# Patient Record
Sex: Female | Born: 1940 | Race: Black or African American | Hispanic: No | Smoking: Current every day smoker
Health system: Southern US, Community
[De-identification: ages and names within clinical notes are randomized; demographics above are authoritative.]

## PROBLEM LIST (undated history)

## (undated) DIAGNOSIS — Z720 Tobacco use: Secondary | ICD-10-CM

## (undated) DIAGNOSIS — I428 Other cardiomyopathies: Secondary | ICD-10-CM

## (undated) DIAGNOSIS — R42 Dizziness and giddiness: Secondary | ICD-10-CM

## (undated) DIAGNOSIS — I10 Essential (primary) hypertension: Secondary | ICD-10-CM

## (undated) DIAGNOSIS — I420 Dilated cardiomyopathy: Secondary | ICD-10-CM

## (undated) DIAGNOSIS — I447 Left bundle-branch block, unspecified: Secondary | ICD-10-CM

## (undated) DIAGNOSIS — M1990 Unspecified osteoarthritis, unspecified site: Secondary | ICD-10-CM

## (undated) DIAGNOSIS — I5022 Chronic systolic (congestive) heart failure: Secondary | ICD-10-CM

## (undated) DIAGNOSIS — I119 Hypertensive heart disease without heart failure: Secondary | ICD-10-CM

## (undated) HISTORY — PX: ABDOMINAL HYSTERECTOMY: SHX81

## (undated) HISTORY — PX: TONSILLECTOMY: SUR1361

## (undated) HISTORY — PX: TUBAL LIGATION: SHX77

## (undated) HISTORY — PX: APPENDECTOMY: SHX54

## (undated) HISTORY — PX: CARDIAC CATHETERIZATION: SHX172

---

## 1998-04-02 ENCOUNTER — Ambulatory Visit (HOSPITAL_COMMUNITY): Admission: RE | Admit: 1998-04-02 | Discharge: 1998-04-02 | Payer: Self-pay | Admitting: Family Medicine

## 2000-11-06 ENCOUNTER — Emergency Department (HOSPITAL_COMMUNITY): Admission: EM | Admit: 2000-11-06 | Discharge: 2000-11-06 | Payer: Self-pay | Admitting: *Deleted

## 2000-12-14 ENCOUNTER — Encounter: Payer: Self-pay | Admitting: Family Medicine

## 2000-12-14 ENCOUNTER — Ambulatory Visit (HOSPITAL_COMMUNITY): Admission: RE | Admit: 2000-12-14 | Discharge: 2000-12-14 | Payer: Self-pay | Admitting: Family Medicine

## 2010-10-19 ENCOUNTER — Encounter: Payer: Self-pay | Admitting: Obstetrics and Gynecology

## 2014-10-24 ENCOUNTER — Encounter (HOSPITAL_COMMUNITY): Payer: Self-pay

## 2014-10-24 ENCOUNTER — Other Ambulatory Visit: Payer: Self-pay

## 2014-10-24 ENCOUNTER — Emergency Department (HOSPITAL_COMMUNITY)
Admission: EM | Admit: 2014-10-24 | Discharge: 2014-10-24 | Disposition: A | Discharge status: Nursing Home (Includes ICF) | Payer: Medicare Other | Source: Home / Self Care | Attending: Emergency Medicine | Admitting: Emergency Medicine

## 2014-10-24 ENCOUNTER — Emergency Department (HOSPITAL_COMMUNITY): Payer: Medicare Other

## 2014-10-24 ENCOUNTER — Observation Stay (HOSPITAL_COMMUNITY)
Admission: EM | Admit: 2014-10-24 | Discharge: 2014-10-26 | Disposition: A | Discharge status: Home / Self Care | Payer: Medicare Other | Attending: Interventional Cardiology | Admitting: Interventional Cardiology

## 2014-10-24 ENCOUNTER — Encounter (HOSPITAL_COMMUNITY): Payer: Self-pay | Admitting: *Deleted

## 2014-10-24 DIAGNOSIS — Z91013 Allergy to seafood: Secondary | ICD-10-CM | POA: Insufficient documentation

## 2014-10-24 DIAGNOSIS — F17210 Nicotine dependence, cigarettes, uncomplicated: Secondary | ICD-10-CM | POA: Diagnosis not present

## 2014-10-24 DIAGNOSIS — I350 Nonrheumatic aortic (valve) stenosis: Secondary | ICD-10-CM | POA: Insufficient documentation

## 2014-10-24 DIAGNOSIS — R9439 Abnormal result of other cardiovascular function study: Secondary | ICD-10-CM | POA: Diagnosis present

## 2014-10-24 DIAGNOSIS — I447 Left bundle-branch block, unspecified: Secondary | ICD-10-CM

## 2014-10-24 DIAGNOSIS — I10 Essential (primary) hypertension: Secondary | ICD-10-CM | POA: Diagnosis not present

## 2014-10-24 DIAGNOSIS — I420 Dilated cardiomyopathy: Secondary | ICD-10-CM | POA: Diagnosis not present

## 2014-10-24 DIAGNOSIS — I5043 Acute on chronic combined systolic (congestive) and diastolic (congestive) heart failure: Secondary | ICD-10-CM

## 2014-10-24 DIAGNOSIS — Z720 Tobacco use: Secondary | ICD-10-CM | POA: Diagnosis present

## 2014-10-24 DIAGNOSIS — R42 Dizziness and giddiness: Secondary | ICD-10-CM | POA: Diagnosis not present

## 2014-10-24 HISTORY — DX: Dizziness and giddiness: R42

## 2014-10-24 HISTORY — DX: Left bundle-branch block, unspecified: I44.7

## 2014-10-24 HISTORY — DX: Dilated cardiomyopathy: I42.0

## 2014-10-24 HISTORY — DX: Tobacco use: Z72.0

## 2014-10-24 LAB — CBC
HEMATOCRIT: 46.2 % — AB (ref 36.0–46.0)
HEMOGLOBIN: 15.6 g/dL — AB (ref 12.0–15.0)
MCH: 29.9 pg (ref 26.0–34.0)
MCHC: 33.8 g/dL (ref 30.0–36.0)
MCV: 88.5 fL (ref 78.0–100.0)
Platelets: 225 10*3/uL (ref 150–400)
RBC: 5.22 MIL/uL — ABNORMAL HIGH (ref 3.87–5.11)
RDW: 13.2 % (ref 11.5–15.5)
WBC: 6.6 10*3/uL (ref 4.0–10.5)

## 2014-10-24 LAB — BASIC METABOLIC PANEL
ANION GAP: 6 (ref 5–15)
BUN: 14 mg/dL (ref 6–23)
CHLORIDE: 106 mmol/L (ref 96–112)
CO2: 26 mmol/L (ref 19–32)
Calcium: 10.7 mg/dL — ABNORMAL HIGH (ref 8.4–10.5)
Creatinine, Ser: 1.02 mg/dL (ref 0.50–1.10)
GFR calc Af Amer: 62 mL/min — ABNORMAL LOW (ref >90)
GFR, EST NON AFRICAN AMERICAN: 53 mL/min — AB (ref >90)
Glucose, Bld: 104 mg/dL — ABNORMAL HIGH (ref 70–99)
Potassium: 4.2 mmol/L (ref 3.5–5.1)
SODIUM: 138 mmol/L (ref 135–145)

## 2014-10-24 LAB — TROPONIN I

## 2014-10-24 LAB — TSH: TSH: 0.901 u[IU]/mL (ref 0.350–4.500)

## 2014-10-24 LAB — I-STAT TROPONIN, ED: Troponin i, poc: 0.01 ng/mL (ref 0.00–0.08)

## 2014-10-24 MED ORDER — SODIUM CHLORIDE 0.9 % IV SOLN
INTRAVENOUS | Status: DC
  Administered 2014-10-24: 15:00:00 via INTRAVENOUS

## 2014-10-24 MED ORDER — ASPIRIN EC 81 MG PO TBEC
81.0000 mg | DELAYED_RELEASE_TABLET | Freq: Every day | ORAL | Status: DC
  Administered 2014-10-24 – 2014-10-26 (×3): 81 mg via ORAL
  Filled 2014-10-24 (×3): qty 1

## 2014-10-24 MED ORDER — ASPIRIN 81 MG PO CHEW
324.0000 mg | CHEWABLE_TABLET | Freq: Once | ORAL | Status: AC
  Administered 2014-10-24: 324 mg via ORAL

## 2014-10-24 MED ORDER — ASPIRIN 81 MG PO CHEW
CHEWABLE_TABLET | ORAL | Status: AC
  Filled 2014-10-24: qty 4

## 2014-10-24 MED ORDER — METOPROLOL TARTRATE 50 MG PO TABS
50.0000 mg | ORAL_TABLET | Freq: Two times a day (BID) | ORAL | Status: DC
  Administered 2014-10-24 – 2014-10-25 (×2): 50 mg via ORAL
  Filled 2014-10-24 (×3): qty 1

## 2014-10-24 MED ORDER — ENOXAPARIN SODIUM 40 MG/0.4ML ~~LOC~~ SOLN
40.0000 mg | SUBCUTANEOUS | Status: DC
  Filled 2014-10-24 (×3): qty 0.4

## 2014-10-24 NOTE — ED Provider Notes (Signed)
CSN: [id number]     Arrival date & time 10/24/14  1613 History   First MD Initiated Contact with Patient 10/24/14 1620     Chief Complaint  Patient presents with  . Dizziness  . Abnormal ECG     (Consider location/radiation/quality/duration/timing/severity/associated sxs/prior Treatment) HPI 74 year old female past medical history of hypertension presents to ED for lightheadedness which is been ongoing for the past 4 days. Patient reports lightheadedness has been intermittent during this time. She reports having an episode of diaphoresis when symptoms again 4 days ago but none since that time. Patient also denies having any chest pain, shortness of breath, nausea, vomiting, indigestion. States she has not been sick recently. Denies having prior history of similar symptoms. Patient states she is seeing cardiology in the past for heart palpitations and after having medication changes she has not had any issues with that since. Patient reports she is a smoker. Prior to coming to the ED she was seen at urgent care where she was given a full dose aspirin.    Past Medical History  Diagnosis Date  . Hypertension    Past Surgical History  Procedure Laterality Date  . Abdominal hysterectomy     History reviewed. No pertinent family history. History  Substance Use Topics  . Smoking status: Current Every Day Smoker -- 0.50 packs/day  . Smokeless tobacco: Not on file  . Alcohol Use: No     Comment: on special occasions   OB History    No data available     Review of Systems  Constitutional: Positive for diaphoresis. Negative for fever and chills.  HENT: Negative for congestion, rhinorrhea and sore throat.   Eyes: Negative for visual disturbance.  Respiratory: Negative for cough and shortness of breath.   Cardiovascular: Negative for chest pain, palpitations and leg swelling.  Gastrointestinal: Negative for nausea, vomiting, abdominal pain, diarrhea and constipation.  Genitourinary:  Negative for dysuria, hematuria, vaginal bleeding and vaginal discharge.  Musculoskeletal: Negative for back pain and neck pain.  Skin: Negative for rash.  Neurological: Positive for light-headedness. Negative for weakness and headaches.  All other systems reviewed and are negative.     Allergies  Shellfish allergy  Home Medications   Prior to Admission medications   Medication Sig Start Date End Date Taking? Authorizing Provider  metoprolol (LOPRESSOR) 50 MG tablet Take 50 mg by mouth 2 (two) times daily.    Historical Provider, MD   BP 141/83 mmHg  Pulse 64  Temp(Src) 97.8 F (36.6 C) (Oral)  Resp 18  Ht 5\' 8"  (1.727 m)  Wt 215 lb (97.523 kg)  BMI 32.70 kg/m2  SpO2 99% Physical Exam  Constitutional: She is oriented to person, place, and time. She appears well-developed and well-nourished. No distress.  HENT:  Head: Normocephalic and atraumatic.  Eyes: Conjunctivae are normal.  Neck: Normal range of motion.  Cardiovascular: Normal rate, regular rhythm, normal heart sounds and intact distal pulses.   No murmur heard. Pulmonary/Chest: Effort normal and breath sounds normal. No respiratory distress. She has no wheezes. She has no rales. She exhibits no tenderness.  Abdominal: Soft. Bowel sounds are normal. She exhibits no distension.  Musculoskeletal: Normal range of motion.  Neurological: She is alert and oriented to person, place, and time. No cranial nerve deficit. GCS eye subscore is 4. GCS verbal subscore is 5. GCS motor subscore is 6.  HDS, AAOx4. PERRL, EOMI, TML, face sym. CN 2-12 grossly intact. 5/5 sym, no drift, SILT, normal gait and coordination.  Skin: Skin is warm and dry.  Psychiatric: She has a normal mood and affect.  Nursing note and vitals reviewed.   ED Course  Procedures (including critical care time) Labs Review Labs Reviewed  BASIC METABOLIC PANEL - Abnormal; Notable for the following:    Glucose, Bld 104 (*)    Calcium 10.7 (*)    GFR calc  non Af Amer 53 (*)    GFR calc Af Amer 62 (*)    All other components within normal limits  CBC - Abnormal; Notable for the following:    RBC 5.22 (*)    Hemoglobin 15.6 (*)    HCT 46.2 (*)    All other components within normal limits  TSH  TROPONIN I  BASIC METABOLIC PANEL  TROPONIN I  TROPONIN I  I-STAT TROPOININ, ED    Imaging Review Dg Chest Port 1 View  10/24/2014   CLINICAL DATA:  Dizziness for 5 days, abnormal EKG, history hypertension, smoking  EXAM: PORTABLE CHEST - 1 VIEW  COMPARISON:  Portable exam 1654 hr without priors for comparison  FINDINGS: Enlargement of cardiac silhouette.  Mediastinal contours and pulmonary vascularity normal.  Lungs clear.  No pleural effusion or pneumothorax.  Mild atherosclerotic calcification aorta.  Bones unremarkable.  IMPRESSION: Enlargement of cardiac silhouette.  No acute abnormalities.   Electronically Signed   By: Ulyses Southward M.D.   On: 10/24/2014 17:03     EKG Interpretation   Date/Time:  Wednesday October 24 2014 16:16:21 EST Ventricular Rate:  66 PR Interval:  240 QRS Duration: 186 QT Interval:  476 QTC Calculation: 499 R Axis:   -9 Text Interpretation:  Sinus rhythm Prolonged PR interval Left bundle  branch block Prior with minimal LVH criteria, nml PR Confirmed by DOCHERTY   MD, MEGAN ([id number]) on 10/24/2014 4:20:46 PM      MDM   Final diagnoses:  None    Patient presents from urgent care for lightheadedness and new left bundle-branch block. On arrival patient is hemodynamically stable and in no apparent distress. She states her dizziness has resolved. Patient is adamant that she has had absolutely no chest pain, shortness of breath during past 4 days. EKG in ED shows new left bundle branch block which was not on prior EKG. Patient has approximately 6 mm of ST elevation in V1, without ST elevation in other leads. Given patient has no chest pain and will await labs and discuss with cardiology. Cards will admit pt.  Pt seen  in conjunction with Dr. Donnamarie Rossetti, DO [facility] Emergency Medicine Resident - PGY-2      Ames Dura, MD 10/25/14 1610  Toy Cookey, MD 10/25/14 1208

## 2014-10-24 NOTE — ED Notes (Signed)
[facility]- pt coming from [facility], was seen there for dizziness X4 days, pt was noticed to have new left bundle branch block. Pt denies chest pain. A&O x4 on arrival.

## 2014-10-24 NOTE — ED Provider Notes (Signed)
CSN: [id number]     Arrival date & time 10/24/14  1411 History   None    Chief Complaint  Patient presents with  . Dizziness   (Consider location/radiation/quality/duration/timing/severity/associated sxs/prior Treatment) HPI         74 year old female with history of high blood pressure, on metoprolol, presents complaining of dizziness. For about 4 days she has had intermittent episodes of dizziness. These are described as feeling lightheaded without any other associated symptoms. This will last for about 2 minutes and will resolve with rest. It happens about 5-6 times per day. She denies any alleviating or exacerbating factors. She denies chest pain or shortness of breath, no nausea or vomiting. No history of dizziness or heart disease.  Past Medical History  Diagnosis Date  . Hypertension    Past Surgical History  Procedure Laterality Date  . Abdominal hysterectomy     History reviewed. No pertinent family history. History  Substance Use Topics  . Smoking status: Current Every Day Smoker  . Smokeless tobacco: Not on file  . Alcohol Use: No   OB History    No data available     Review of Systems  Constitutional: Negative for fever and chills.  Gastrointestinal: Negative for nausea and vomiting.  Skin: Negative for rash.  Neurological: Positive for dizziness.  All other systems reviewed and are negative.   Allergies  Shellfish allergy  Home Medications   Prior to Admission medications   Medication Sig Start Date End Date Taking? Authorizing Provider  metoprolol (LOPRESSOR) 50 MG tablet Take 50 mg by mouth 2 (two) times daily.   Yes Historical Provider, MD   BP 152/96 mmHg  Pulse 80  Temp(Src) 98.6 F (37 C) (Oral)  Resp 18  SpO2 97% Physical Exam  Constitutional: She is oriented to person, place, and time. Vital signs are normal. She appears well-developed and well-nourished. No distress.  HENT:  Head: Normocephalic and atraumatic.  Neck: Normal range of  motion. Neck supple. No JVD present. No tracheal deviation present.  Cardiovascular: Normal rate, regular rhythm and normal heart sounds.   Pulmonary/Chest: Effort normal and breath sounds normal. No respiratory distress.  Lymphadenopathy:    She has no cervical adenopathy.  Neurological: She is alert and oriented to person, place, and time. She has normal strength. Coordination normal.  Skin: Skin is warm and dry. No rash noted. She is not diaphoretic.  Psychiatric: She has a normal mood and affect. Judgment normal.  Nursing note and vitals reviewed.   ED Course  ED EKG  Date/Time: 10/24/2014 3:20 PM Performed by: Autumn Messing, H Authorized by: Autumn Messing, H Comparison: not compared with previous ECG  Previous ECG: no previous ECG available Rhythm: sinus rhythm Rate: normal QRS axis: normal Conduction: left bundle branch block Other: no other findings Clinical impression: abnormal ECG   (including critical care time) Labs Review Labs Reviewed - No data to display  Imaging Review No results found.   MDM   1. Dizziness   2. LBBB (left bundle branch block)    New LBBB, r/o MI.  Started on IV, O2, monitor, 324 ASA, transferred to ED via EMS     Graylon Good, PA-C 10/24/14 1521

## 2014-10-24 NOTE — H&P (Addendum)
ADMISSION HISTORY AND PHYSICAL   Date: 10/24/2014               Patient Name:  Nicole Chan MRN: [id number]  DOB: 1940/11/25 Age / Sex: 74 y.o., female        PCP: Lesleigh Noe Primary Cardiologist: Katrinka Blazing          History of Present Illness: Patient is a 74 y.o. female with a PMHx of HTN , who was admitted to [facility] on 10/24/2014 for evaluation of  Dizziness .  She has had intermittant dizziness - 5-6 times a day ,  Denies any chest pain or dyspnea. ,  No N/V   Each episode of dizziness may last 15 minutes.  . This past Saturday she had profound dizziness .    Her daughter says that she was quite weak.  Denies any nausea, vomitting.  Eating well.  Some of the episodes are c/w  Vertigo - others are not associated with head movement.  These episodes are not related to movement , .  Not necessarily related to standing  Up - can occur while sitting down.  No dysuria. BP was noted to be elevated.  No nausea Woke up in a sweat several nights ago  She has never had these before.     Medications: Outpatient medications:  (Not in a hospital admission)  Allergies  Allergen Reactions  . Shellfish Allergy      Past Medical History  Diagnosis Date  . Hypertension     Past Surgical History  Procedure Laterality Date  . Abdominal hysterectomy      Family History  Problem Relation Age of Onset  . Arrhythmia Mother   . Hypertension Mother   . Diabetes Mother     Social History:  reports that she has been smoking.  She does not have any smokeless tobacco history on file. She reports that she does not drink alcohol. Her drug history is not on file.   Review of Systems: Constitutional:  denies fever, chills, diaphoresis, appetite change and fatigue.  HEENT: denies photophobia, eye pain, redness, hearing loss, ear pain, congestion, sore throat, rhinorrhea, sneezing, neck pain, neck stiffness and tinnitus.  Respiratory: denies SOB, DOE, cough, chest tightness,  and wheezing.  Cardiovascular: denies chest pain, palpitations and leg swelling.  Gastrointestinal: denies nausea, vomiting, abdominal pain, diarrhea, constipation, blood in stool.  Genitourinary: denies dysuria, urgency, frequency, hematuria, flank pain and difficulty urinating.  Musculoskeletal: denies  myalgias, back pain, joint swelling, arthralgias and gait problem.   Skin: denies pallor, rash and wound.  Neurological: admits to dizziness, , denies symcope   Hematological: denies adenopathy, easy bruising, personal or family bleeding history.  Psychiatric/ Behavioral: denies suicidal ideation, mood changes, confusion, nervousness, sleep disturbance and agitation.     Physical Exam: BP 141/83 mmHg  Pulse 64  Temp(Src) 97.8 F (36.6 C) (Oral)  Resp 18  Ht 5\' 8"  (1.727 m)  Wt 215 lb (97.523 kg)  BMI 32.70 kg/m2  SpO2 99%  Wt Readings from Last 3 Encounters:  10/24/14 215 lb (97.523 kg)    General: Vital signs reviewed and noted. Well-developed, well-nourished, in no acute distress; alert,   Head: Normocephalic, atraumatic, sclera anicteric, mucus membranes are moist   Neck: Supple. Negative for carotid bruits. JVD not elevated.   Lungs:  Clear bilaterally to auscultation without wheezes, rales, or rhonchi. Breathing is normal   Heart: RRR with S1 S2. No murmurs, rubs, or gallops.   Abdomen:  Soft, non-tender, non-distended with normoactive bowel sounds. No hepatomegaly. No rebound/guarding. No obvious abdominal masses   MSK: Strength and the appear normal for age.   Extremities: No clubbing or cyanosis. No edema.  Distal pedal pulses are 2+ and equal bilaterally .  Neurologic: Alert and oriented X 3. Moves all extremities spontaneously   Psych:  normal     Lab results: Basic Metabolic Panel: No results for input(s): NA, K, CL, CO2, GLUCOSE, BUN, CREATININE, CALCIUM, MG, PHOS in the last 168 hours.  Liver Function Tests: No results for input(s): AST, ALT, ALKPHOS,  BILITOT, PROT, ALBUMIN in the last 168 hours. No results for input(s): LIPASE, AMYLASE in the last 168 hours.  CBC: No results for input(s): WBC, NEUTROABS, HGB, HCT, MCV, PLT in the last 168 hours.  Cardiac Enzymes: No results for input(s): CKTOTAL, CKMB, CKMBINDEX, TROPONINI in the last 168 hours.  BNP: Invalid input(s): POCBNP  CBG: No results for input(s): GLUCAP in the last 168 hours.  Coagulation Studies: No results for input(s): LABPROT, INR in the last 72 hours.   Other results: EKG ( reviewed by me)  :  NSR .  Rate of 66. LBBB   Imaging: Dg Chest Port 1 View  10/24/2014   CLINICAL DATA:  Dizziness for 5 days, abnormal EKG, history hypertension, smoking  EXAM: PORTABLE CHEST - 1 VIEW  COMPARISON:  Portable exam 1654 hr without priors for comparison  FINDINGS: Enlargement of cardiac silhouette.  Mediastinal contours and pulmonary vascularity normal.  Lungs clear.  No pleural effusion or pneumothorax.  Mild atherosclerotic calcification aorta.  Bones unremarkable.  IMPRESSION: Enlargement of cardiac silhouette.  No acute abnormalities.   Electronically Signed   By: Ulyses Southward M.D.   On: 10/24/2014 17:03      Assessment & Plan:  1. Dizziness: The patient presents with several days of dizziness. She has intermittent dizziness that occurs several times through the day.  She had a particularly bad day this past Saturday. Her daughter states that she was very weak and was not able to do any sort of activity. Summary of her episodes sound consistent with vertigo and others sound consistent with orthostasis. She then describes episodes that occur while sitting down and are not related to twisting her head. These are concerning for an arrhythmia.  She does have a left bundle-branch block which is newly recognized. She does not recall being told that she had a left bundle-branch block in the past.  She's not having any severe cardiac symptoms but I do think that it is reasonable to  admit her for observation and rule out myocardial infarction given her new left bundle-branch block. We will admit her to telemetry.  Will anticipate doing an echo cardiac MR. If she has a normal echocardiogram and has no dysrhythmias overnight I think that she can be discharged home in continue the workup as an outpatient. I wouldn't his pain that she will need a Lexiscan Myoview study.  Will repeat BMP tomorrow.  Will check TSH.   2. Hypertension: The patient's blood pressure and heart rate are well controlled. Continue current dose of metoprolol for the time being.  3. History of cigarette smoking: I've encouraged her to stop smoking. Her hemoglobin is 15.6 which is a bit elevated. I suspect this is due to smoking.  DVT PPX -    Alvia Grove., MD, [facility] 10/24/2014, 5:31 PM

## 2014-10-24 NOTE — ED Notes (Signed)
PT    PLACED    ON  CARDIAC  MONITOR  NASAL  O2  AT  2 L  /  MIN       IV  NS  20  ANGIO   L  ARM  1  ATT  SITE  PATENT

## 2014-10-25 ENCOUNTER — Other Ambulatory Visit: Payer: Self-pay | Admitting: Physician Assistant

## 2014-10-25 ENCOUNTER — Encounter (HOSPITAL_COMMUNITY): Payer: Self-pay | Admitting: Physician Assistant

## 2014-10-25 DIAGNOSIS — I10 Essential (primary) hypertension: Secondary | ICD-10-CM | POA: Diagnosis present

## 2014-10-25 DIAGNOSIS — R42 Dizziness and giddiness: Secondary | ICD-10-CM

## 2014-10-25 DIAGNOSIS — I420 Dilated cardiomyopathy: Secondary | ICD-10-CM | POA: Diagnosis not present

## 2014-10-25 DIAGNOSIS — I447 Left bundle-branch block, unspecified: Secondary | ICD-10-CM | POA: Diagnosis not present

## 2014-10-25 HISTORY — DX: Dizziness and giddiness: R42

## 2014-10-25 LAB — TROPONIN I: Troponin I: <0.03 ng/mL (ref <0.031)

## 2014-10-25 MED ORDER — IRBESARTAN 75 MG PO TABS
75.0000 mg | ORAL_TABLET | Freq: Every day | ORAL | Status: DC
  Administered 2014-10-25 – 2014-10-26 (×2): 75 mg via ORAL
  Filled 2014-10-25 (×2): qty 1

## 2014-10-25 MED ORDER — CARVEDILOL 6.25 MG PO TABS
6.2500 mg | ORAL_TABLET | Freq: Two times a day (BID) | ORAL | Status: DC
  Administered 2014-10-25 – 2014-10-26 (×3): 6.25 mg via ORAL
  Filled 2014-10-25 (×4): qty 1

## 2014-10-25 NOTE — Progress Notes (Signed)
Nursing note RN in to speak with patient, explained the importance of the lab work needing to be drawn, pt stated she is "done with all blood draws at this time" she agreed to continue with the scheduled stress test for today but will not get her blood draw for Labs. Will continue to monitor patient . Nakira Litzau, Randall An RN

## 2014-10-25 NOTE — Progress Notes (Signed)
10/25/2014 8:18 AM Pt. Refusing all blood draws.  Dr. Katrinka Blazing notified.  No orders received. Kathryne Hitch

## 2014-10-25 NOTE — Progress Notes (Signed)
Echocardiogram completed.

## 2014-10-25 NOTE — Progress Notes (Addendum)
Patient Name: Nicole Chan Date of Encounter: 10/25/2014     Active Problems:   LBBB (left bundle branch block)    SUBJECTIVE  Feeling fine. No dizziness. We stood up an walked around together and she still felt fine. She would like to go home.   CURRENT MEDS . aspirin EC  81 mg Oral Daily  . enoxaparin (LOVENOX) injection  40 mg Subcutaneous Q24H  . metoprolol  50 mg Oral BID    OBJECTIVE  Filed Vitals:   10/24/14 1845 10/24/14 1925 10/24/14 2125 10/25/14 0505  BP: 126/72 160/87 156/69 146/58  Pulse: 59 69 63 55  Temp:  98.1 F (36.7 C)  97.8 F (36.6 C)  TempSrc:  Oral  Oral  Resp: 14 18  18   Height:      Weight:  236 lb 5.3 oz (107.2 kg)    SpO2: 100% 100%  98%   No intake or output data in the 24 hours ending 10/25/14 0910 Filed Weights   10/24/14 1618 10/24/14 1925  Weight: 215 lb (97.523 kg) 236 lb 5.3 oz (107.2 kg)    PHYSICAL EXAM  General: Pleasant, NAD. Neuro: Alert and oriented X 3. Moves all extremities spontaneously. Psych: Normal affect. HEENT:  Normal  Neck: Supple without bruits or JVD. Lungs:  Resp regular and unlabored, CTA. Heart: RRR no s3, s4, or murmurs. Abdomen: Soft, non-tender, non-distended, BS + x 4.  Extremities: No clubbing, cyanosis or edema. DP/PT/Radials 2+ and equal bilaterally.  Accessory Clinical Findings  CBC  Recent Labs  10/24/14 1722  WBC 6.6  HGB 15.6*  HCT 46.2*  MCV 88.5  PLT 225   Basic Metabolic Panel  Recent Labs  10/24/14 1722  NA 138  K 4.2  CL 106  CO2 26  GLUCOSE 104*  BUN 14  CREATININE 1.02  CALCIUM 10.7*    Cardiac Enzymes  Recent Labs  10/24/14 2046  TROPONINI <0.03    Thyroid Function Tests  Recent Labs  10/24/14 1838  TSH 0.901    TELE  LBBB, sinus brady HR 50s.  Radiology/Studies  Dg Chest Port 1 View  10/24/2014   CLINICAL DATA:  Dizziness for 5 days, abnormal EKG, history hypertension, smoking  EXAM: PORTABLE CHEST - 1 VIEW  COMPARISON:  Portable exam  1654 hr without priors for comparison  FINDINGS: Enlargement of cardiac silhouette.  Mediastinal contours and pulmonary vascularity normal.  Lungs clear.  No pleural effusion or pneumothorax.  Mild atherosclerotic calcification aorta.  Bones unremarkable.  IMPRESSION: Enlargement of cardiac silhouette.  No acute abnormalities.   Electronically Signed   By: Ulyses Southward M.D.   On: 10/24/2014 17:03    ASSESSMENT AND PLAN  Nicole Chan is a 74 y.o. female with a history of HTN and tobacco abuse who presented to [facility] ED on 10/24/14 with recurrent episodes of dizziness.   Dizziness: The patient presents with several days of dizziness. She has intermittent dizziness that occurs several times through the day. She had a particularly bad day this past Saturday. Her daughter states that she was very weak and was not able to do any sort of activity. Summary of her episodes sound consistent with vertigo and others sound consistent with orthostasis. She then describes episodes that occur while sitting down and are not related to twisting her head. -- Tele reveals no arrhythmias, aside from some sinus bradycardia: HR 50s. -- TSH normal  -- 2D ECHO and carotid dopplers pending today. If 2D ECHO is okay, will discharge  home with 2 week event monitor and f/u with Dr. Katrinka Blazing.   Newly recognized LBBB- Troponin negative and no chest pain.   -- 2D ECHO pending today. If 2D ECHO is okay, will discharge home   Hypertension: The patient's blood pressure and heart rate are well controlled. Continue current dose of metoprolol for the time being.  History of cigarette smoking: Her hemoglobin is 15.6 which is a bit elevated. I suspect this is due to smoking. -- She is confident that she can quit on her own, but knows we are willing to prescribe nicotine patches or other cessation aids if she needs this in the future.    Billy Fischer PA-C  Pager [phone]  I seen and evaluated the patient this morning  after being seen by Ms. Janee Morn.  I reviewed the clinical data in chart. Principal Problem:   Episode of dizziness Active Problems:   Congestive dilated cardiomyopathy: New diagnosis   Essential hypertension   LBBB (left bundle branch block)   Patient was admitted with atypical symptoms of dizziness and nausea. Concern was for possible arrhythmias. The initial thought would be if she ruled out for MI discharge plans to monitor. Unfortunately while I was seeing her, her echocardiogram was read indicating an EF of 30-35% with global hypokinesis and elevated filling pressures. This would be considered a new diagnosis of cardiomyopathy. With left bundle-branch block and we diagnosed cardiomyopathy, the concern is this is ischemic versus nonischemic. Potentially hypertensive versus bundle-branch related. However with new bundle-branch block and new EF being down we definitely need to exclude ischemia.  I spent close to 30 minutes discussing options for further evaluation with the patient including inpatient cardiac catheterization for definitive diagnosis, inpatient nuclear stress test discharge with outpatient nuclear stress test. She has chosen to proceed with inpatient nuclear stress test as she is not interested currently in the LAD of an invasive evaluation.  We will then continue with the plan to do an outpatient event monitor to ensure that she is not having any severe arrhythmias that are causing her dizziness. The concern is that these episodes could also potentially be anginal equivalents.  Plan: Lexiscan Myoview in the morning (she has disc disease and is concerned about the ability of increasing her heart rate enough with brisk walking without hurting her back).  She is currently on Lopressor 50 mg twice a day which would probably better converted to either Toprol or carvedilol. She is deathly has blood pressure room to make this adjustment. I will convert her to carvedilol and add  ARB.  Marykay Lex, M.D., M.S. Interventional Cardiologist   Pager # [phone]

## 2014-10-25 NOTE — Progress Notes (Signed)
UR completed 

## 2014-10-25 NOTE — Progress Notes (Signed)
VASCULAR LAB PRELIMINARY  PRELIMINARY  PRELIMINARY  PRELIMINARY  Carotid Dopplers completed.    Preliminary report:  1-39% ICA stenosis.  Vertebral artery flow is antegrade.   Karess Harner, RVT 10/25/2014, 10:25 AM

## 2014-10-26 ENCOUNTER — Encounter (HOSPITAL_COMMUNITY): Payer: Self-pay | Admitting: Cardiology

## 2014-10-26 ENCOUNTER — Observation Stay (HOSPITAL_COMMUNITY): Payer: Medicare Other

## 2014-10-26 DIAGNOSIS — I10 Essential (primary) hypertension: Secondary | ICD-10-CM | POA: Diagnosis not present

## 2014-10-26 DIAGNOSIS — I420 Dilated cardiomyopathy: Secondary | ICD-10-CM | POA: Diagnosis not present

## 2014-10-26 DIAGNOSIS — I5020 Unspecified systolic (congestive) heart failure: Secondary | ICD-10-CM | POA: Diagnosis present

## 2014-10-26 DIAGNOSIS — R931 Abnormal findings on diagnostic imaging of heart and coronary circulation: Secondary | ICD-10-CM

## 2014-10-26 DIAGNOSIS — Z7982 Long term (current) use of aspirin: Secondary | ICD-10-CM | POA: Diagnosis not present

## 2014-10-26 DIAGNOSIS — Z87891 Personal history of nicotine dependence: Secondary | ICD-10-CM | POA: Diagnosis not present

## 2014-10-26 DIAGNOSIS — R9439 Abnormal result of other cardiovascular function study: Secondary | ICD-10-CM | POA: Diagnosis present

## 2014-10-26 DIAGNOSIS — I447 Left bundle-branch block, unspecified: Secondary | ICD-10-CM | POA: Diagnosis not present

## 2014-10-26 DIAGNOSIS — R079 Chest pain, unspecified: Secondary | ICD-10-CM

## 2014-10-26 DIAGNOSIS — Z720 Tobacco use: Secondary | ICD-10-CM | POA: Diagnosis present

## 2014-10-26 MED ORDER — REGADENOSON 0.4 MG/5ML IV SOLN
INTRAVENOUS | Status: AC
  Filled 2014-10-26: qty 5

## 2014-10-26 MED ORDER — REGADENOSON 0.4 MG/5ML IV SOLN
0.4000 mg | Freq: Once | INTRAVENOUS | Status: AC
  Administered 2014-10-26: 0.4 mg via INTRAVENOUS
  Filled 2014-10-26: qty 5

## 2014-10-26 MED ORDER — CARVEDILOL 6.25 MG PO TABS: 6.2500 mg | ORAL_TABLET | Freq: Two times a day (BID) | ORAL | Status: DC

## 2014-10-26 MED ORDER — ASPIRIN 81 MG PO TBEC: 81.0000 mg | DELAYED_RELEASE_TABLET | Freq: Every day | ORAL | Status: DC

## 2014-10-26 MED ORDER — TECHNETIUM TC 99M SESTAMIBI GENERIC - CARDIOLITE
10.0000 | Freq: Once | INTRAVENOUS | Status: AC | PRN
  Administered 2014-10-26: 10 via INTRAVENOUS

## 2014-10-26 MED ORDER — IRBESARTAN 75 MG PO TABS: 75.0000 mg | ORAL_TABLET | Freq: Every day | ORAL | Status: DC

## 2014-10-26 MED ORDER — NITROGLYCERIN 0.4 MG SL SUBL: 0.4000 mg | SUBLINGUAL_TABLET | SUBLINGUAL | Status: DC | PRN

## 2014-10-26 MED ORDER — TECHNETIUM TC 99M SESTAMIBI GENERIC - CARDIOLITE
30.0000 | Freq: Once | INTRAVENOUS | Status: AC | PRN
  Administered 2014-10-26: 30 via INTRAVENOUS

## 2014-10-26 NOTE — Discharge Summary (Signed)
Physician Discharge Summary       Patient ID: Nicole Chan MRN: [id number] DOB/AGE: Apr 28, 1941 74 y.o.  Admit date: 10/24/2014 Discharge date: 10/26/2014   Primary Cardiologist:Dr. Katrinka Blazing   Discharge Diagnoses:  Principal Problem:   Episode of dizziness, secondary to cardiomyopathy possibly  Active Problems:   Congestive dilated cardiomyopathy: New diagnosis   Abnormal nuclear stress test - septal & lateral fixed defects with EF ~28%   LBBB (left bundle branch block)- new   Essential hypertension   Tobacco use   Discharged Condition: good  Procedures: none  Hospital Course: 74 y.o. female with a PMHx of HTN , who was admitted to [facility] on 10/24/2014 for evaluation of Dizziness .  She has had intermittant dizziness - 5-6 times a day , Denies any chest pain or dyspnea. , No N/V  Each episode of dizziness may last 15 minutes. This past Saturday she had profound dizziness . Her daughter says that she was quite weak. Denies any nausea, vomitting. Eating well.  Some of the episodes are c/w Vertigo - others are not associated with head movement.  These episodes are not related to movement , . Not necessarily related to standing Up - can occur while sitting down. No dysuria. BP was noted to be elevated. Woke up in a sweat several nights ago .  She has never had these before.   She was admitted to telemetry for further eval. For possibility of  Arrhthymias. She had no chest pain but new LBBB.  We did counsel on tobacco cessation.   Echo revealed Drop in EF. Left ventricle: The cavity size was normal. There was severe concentric hypertrophy. Systolic function was moderately to severely reduced. The estimated ejection fraction was in the range of 30% to 35%. Global hypokinesis with incoordinate septal motion. Doppler parameters are consistent with abnormal left ventricular relaxation (grade 1 diastolic dysfunction). The E/e&' ratio is >15, suggesting  elevated LV filling pressure. - Aortic valve: Trileaflet. Sclerosis without stenosis. There was trivial regurgitation. - Left atrium: The atrium was normal in size. Impressions: - LVEF 30-35%, global hypokinesis, incoordinate septal motion, aortic sclerosis with trivial AI, diastolic dysfunction with elevated LV filling pressure.  Dr. Herbie Baltimore and Dr. Katrinka Blazing talked to the pt and it was agreed to proceed with in pt. nuc study.  Meds were adjusted in the mean time with changing lopressor to coreg and adding Avapro.  Her nuc results were reviewed by Dr. Herbie Baltimore and he felt cardiac cath was needed for complete picture with scar noted on nuc study.   Pt is discharged today with instructions  to return on Monday 10/29/14 at 0700 for cardiac cath she will be NPO after MN on Sunday night.  She is allergic to shellfish and may need steroids prior to cath.  She is instructed to rest this weekend and come back to hospital if any issues.  Dr. Herbie Baltimore discussed this with her.   Consults: None  Significant Diagnostic Studies:  BMET    Component Value Date/Time   NA 138 10/24/2014 1722   K 4.2 10/24/2014 1722   CL 106 10/24/2014 1722   CO2 26 10/24/2014 1722   GLUCOSE 104* 10/24/2014 1722   BUN 14 10/24/2014 1722   CREATININE 1.02 10/24/2014 1722   CALCIUM 10.7* 10/24/2014 1722   GFRNONAA 53* 10/24/2014 1722   GFRAA 62* 10/24/2014 1722    CBC    Component Value Date/Time   WBC 6.6 10/24/2014 1722   RBC 5.22* 10/24/2014 1722   HGB  15.6* 10/24/2014 1722   HCT 46.2* 10/24/2014 1722   PLT 225 10/24/2014 1722   MCV 88.5 10/24/2014 1722   MCH 29.9 10/24/2014 1722   MCHC 33.8 10/24/2014 1722   RDW 13.2 10/24/2014 1722    Troponin <0.03  TSH 0.901   EXAM: PORTABLE CHEST - 1 VIEW COMPARISON: Portable exam 1654 hr without priors for comparison  FINDINGS: Enlargement of cardiac silhouette.  Mediastinal contours and pulmonary vascularity normal.  Lungs clear.  No pleural  effusion or pneumothorax.  Mild atherosclerotic calcification aorta.  Bones unremarkable. IMPRESSION: Enlargement of cardiac silhouette.  No acute abnormalities.  LEXISCAN Myoview FINDINGS: Perfusion: Decreased activity in the low septal area on both sets of images consistent with septal scar. No reversible defects to suggest ischemia.  Wall Motion: Global hypokinesis.  Left Ventricular Ejection Fraction: 28 %  End diastolic volume 186 ml  End systolic volume 134 ml  IMPRESSION: 1. Septal scar. No ischemia.  2. Marked global hypokinesis.  3. Left ventricular ejection fraction 28%  4. High-risk stress test findings*.  Carotid dopplers: Bilateral: mild calcific plaque origin ICA and ECA. 1-39% ICA stenosis. Vertebral artery flow is antegrade. ICA/CCA ratio: R-1.0 L-1.2.   Discharge Exam: Blood pressure 156/77, pulse 65, temperature 97.1 F (36.2 C), temperature source Oral, resp. rate 18, height  (1.727 m), weight 236 lb 5.3 oz (107.2 kg), SpO2 96 %.  Disposition: home     Medication List    STOP taking these medications        metoprolol 50 MG tablet  Commonly known as:  LOPRESSOR      TAKE these medications        aspirin 81 MG EC tablet  Take 1 tablet (81 mg total) by mouth daily.     carvedilol 6.25 MG tablet  Commonly known as:  COREG  Take 1 tablet (6.25 mg total) by mouth 2 (two) times daily with a meal.     ibuprofen 200 MG tablet  Commonly known as:  ADVIL,MOTRIN  Take 200 mg by mouth every 6 (six) hours as needed for moderate pain.     irbesartan 75 MG tablet  Commonly known as:  AVAPRO  Take 1 tablet (75 mg total) by mouth daily.     nitroGLYCERIN 0.4 MG SL tablet  Commonly known as:  NITROSTAT  Place 1 tablet (0.4 mg total) under the tongue every 5 (five) minutes as needed for chest pain.       Follow-up Information    Follow up with [facility].   Why:  office  will schedule to be placed on Tuesday or Wed.   Contact information:   [address] [place] [address] [phone]      Follow up with Lesleigh Noe, MD On 11/12/2014.   Specialty:  Cardiology   Why:  @ 3pm   Contact information:   [address] [place] [address] [phone]        Discharge Instructions: We stopped your lopressor and changed to a different similar medication for BP and to improve your heart function.  Weigh daily Call [phone] if weight climbs more than 3 pounds in a day or 5 pounds in a week. Decrease salt in your diet.  No more than 2000 mg in a day. With a decrease on how strong your heart pumps, the salt could cause swelling.   Call if increased shortness of breath or increased swelling.  Do Not eat after midnight Sunday night for cardiac cath on Monday AM 10/29/14.  Come to the Short Stay Unit at the Hospital at 7:00AM.  Your procedure will be done at 9:00AM,  You should not drive.    Stop tobacco.  Take it easy this weekend, no housekeeping just rest.  If you develop shortness of breath and or chest pain please call or come to the ER at [facility].    You will have a monitor placed but this will be done on Tuesday or Wed. On next week.    Take 1 NTG, under your tongue, while sitting.  If no relief of pain may repeat NTG, one tab every 5 minutes up to 3 tablets total over 15 minutes.  If no relief CALL 911.  If you have dizziness/lightheadness  while taking NTG, stop taking and call 911.            Signed: Leone Brand Nurse Practitioner-Certified [facility]: [facility] 10/26/2014, 7:34 PM  Time spent on discharge :>30 minutes.

## 2014-10-26 NOTE — Progress Notes (Signed)
10/26/2014 7:29 PM Discharge AVS meds taken today and those due this evening reviewed. Instructed pt. To arrive to [facility] Monday at 7AM for Cardiac Cath with Dr. Katrinka Blazing.  Follow-up appointments and when to call md reviewed.  D/C IV and TELE.  Questions and concerns addressed.   D/C home per orders. Kathryne Hitch

## 2014-10-26 NOTE — Progress Notes (Signed)
lexiscan myoview completed without complications.  No chest pain.  Some SOB resolved quickly.  BP elevated initially then controlled.

## 2014-10-26 NOTE — Progress Notes (Signed)
Patient Name: Nicole Chan Date of Encounter: 10/26/2014     Principal Problem:   Episode of dizziness Active Problems:   LBBB (left bundle branch block)   Essential hypertension   Congestive dilated cardiomyopathy: New diagnosis    SUBJECTIVE  Waiting for stress test. Feeling fine. Just a little weak. No dizziness or CP .    CURRENT MEDS . aspirin EC  81 mg Oral Daily  . carvedilol  6.25 mg Oral BID WC  . enoxaparin (LOVENOX) injection  40 mg Subcutaneous Q24H  . irbesartan  75 mg Oral Daily    OBJECTIVE  Filed Vitals:   10/25/14 1514 10/25/14 2033 10/26/14 0350 10/26/14 0700  BP: 140/74 152/48 152/57 154/77  Pulse: 67 60 64 64  Temp: 98 F (36.7 C) 97.9 F (36.6 C) 97.9 F (36.6 C)   TempSrc: Oral Oral Oral   Resp: 18 18 18    Height:      Weight:      SpO2: 98% 96%      Intake/Output Summary (Last 24 hours) at 10/26/14 0749 Last data filed at 10/26/14 0700  Gross per 24 hour  Intake    120 ml  Output      0 ml  Net    120 ml   Filed Weights   10/24/14 1618 10/24/14 1925  Weight: 215 lb (97.523 kg) 236 lb 5.3 oz (107.2 kg)    PHYSICAL EXAM  General: Pleasant, NAD. Neuro: Alert and oriented X 3. Moves all extremities spontaneously. Psych: Normal affect. HEENT:  Normal  Neck: Supple without bruits or JVD. Lungs:  Resp regular and unlabored, CTA. Heart: brady. no s3, s4, or murmurs. Abdomen: Soft, non-tender, non-distended, BS + x 4.  Extremities: No clubbing, cyanosis or edema. DP/PT/Radials 2+ and equal bilaterally.  Accessory Clinical Findings  CBC  Recent Labs  10/24/14 1722  WBC 6.6  HGB 15.6*  HCT 46.2*  MCV 88.5  PLT 225   Basic Metabolic Panel  Recent Labs  10/24/14 1722  NA 138  K 4.2  CL 106  CO2 26  GLUCOSE 104*  BUN 14  CREATININE 1.02  CALCIUM 10.7*    Cardiac Enzymes  Recent Labs  10/24/14 2046 10/25/14 1345  TROPONINI <0.03 <0.03    Thyroid Function Tests  Recent Labs  10/24/14 1838  TSH  0.901    TELE  LBBB, sinus brady HR 50s.  Radiology/Studies  Dg Chest Port 1 View  10/24/2014   CLINICAL DATA:  Dizziness for 5 days, abnormal EKG, history hypertension, smoking  EXAM: PORTABLE CHEST - 1 VIEW  COMPARISON:  Portable exam 1654 hr without priors for comparison  FINDINGS: Enlargement of cardiac silhouette.  Mediastinal contours and pulmonary vascularity normal.  Lungs clear.  No pleural effusion or pneumothorax.  Mild atherosclerotic calcification aorta.  Bones unremarkable.  IMPRESSION: Enlargement of cardiac silhouette.  No acute abnormalities.   Electronically Signed   By: Ulyses Southward M.D.   On: 10/24/2014 17:03    ASSESSMENT AND PLAN  Nicole Chan is a 74 y.o. female with a history of HTN and tobacco abuse who presented to [facility] ED on 10/24/14 with recurrent episodes of dizziness.   Dizziness: Patient was admitted with atypical symptoms of dizziness and nausea. Concern was for possible arrhythmias. The initial thought would be if she ruled out for MI discharge plans to monitor. -- Tele reveals no arrhythmias, aside from some sinus bradycardia: HR 50s. -- TSH normal  -- Carotid dopplers w/ bilateral calcific plaque  origin ICA and ECA. 1-39% ICA stenosis. ICA/CCA ratio: R-1.0L-1.2.   Ischemic CM- newly diagnosed. 2D ECHO with EF: 30-35% with global hypokinesis and elevated filling pressures.  -- Placed on irbesartan  and Coreg 6.25mg  BID  Newly recognized LBBB- Troponin negative and no chest pain.   -- With LBBB and new cardiomyopathy, ischemia must be excluded. Plan for [employer] today. If negative for ischemia she may be able to go home.   Hypertension: Well controlled on ARB and BB  History of cigarette smoking: Her hemoglobin is 15.6 which is a bit elevated. I suspect this is due to smoking. -- She is confident that she can quit on her own, but knows we are willing to prescribe nicotine patches or other cessation aids if she needs this in the future.     SignedJanetta Hora PA-C  Pager [phone]  I finally saw the patient after her nuclear stress test today. She was doing relatively well, but did have some mild dizziness this morning. It did not seem to be on telemetry that was untoward.  She was worked in for her stress test today (unfortunately had not been made nothing by mouth so she had breakfast delivered). Myoview unfortunately was noted to be abnormal with septal and lateral defects. While the septal defect could very well be related to left bundle branch block. The lateral defect is harder to explain. I discussed these findings with other colleagues and we all agree that probably the best course of action would be to proceed with invasive evaluation. She is however relatively asymptomatic from a ischemic standpoint and has not shown any significant arrhythmias. I therefore think she is probably stable for discharged home to return next week for right and left heart catheterization.  Her blood pressure would probably be able tolerate further titration of medications in the outpatient setting. She is now on ARB and beta blocker. This morning pressures were well-controlled.  She is scheduled for Monday morning 9 AM right left heart catheterization with Dr. Verdis Prime (her primary cardiologist).  I initially indicated to the patient if she were to have recurrence or worsening of symptoms she should return directly back to the hospital. She is instructed to essentially stayed home and relax until her procedures performed. I don't think that there is a high risk for her to go home as she is asymptomatic from an ischemic standpoint. If she were to come to the clinic with this test would be scheduled as an outpatient cardiac catheterization. There was no evidence of ischemia on the nuclear stress test but suggestion of prior infarct.  Marykay Lex, M.D., M.S. Interventional Cardiologist   Pager #  [phone]

## 2014-10-26 NOTE — Discharge Instructions (Signed)
We stopped your lopressor and changed to a different similar medication for BP and to improve your heart function.  Weigh daily Call [phone] if weight climbs more than 3 pounds in a day or 5 pounds in a week. Decrease salt in your diet.  No more than 2000 mg in a day. With a decrease on how strong your heart pumps, the salt could cause swelling.   Call if increased shortness of breath or increased swelling.    Do Not eat after midnight Sunday night for cardiac cath on Monday AM 10/29/14.  Come to the Short Stay Unit at the Hospital at 7:00AM.  Your procedure will be done at 9:00AM,  You should not drive.  Dr. Katrinka Blazing will do the procedure.  Stop tobacco.  Take it easy this weekend, no housekeeping just rest.  If you develop shortness of breath and or chest pain please call- [phone]  or come to the ER at [facility].    You will have a monitor placed but this will be done on Tuesday or Wed. On next week.    Take 1 NTG, under your tongue, while sitting.  If no relief of pain may repeat NTG, one tab every 5 minutes up to 3 tablets total over 15 minutes.  If no relief CALL 911.  If you have dizziness/lightheadness  while taking NTG, stop taking and call 911.

## 2014-10-28 ENCOUNTER — Other Ambulatory Visit: Payer: Self-pay | Admitting: Interventional Cardiology

## 2014-10-29 ENCOUNTER — Ambulatory Visit (HOSPITAL_COMMUNITY)
Admission: RE | Admit: 2014-10-29 | Discharge: 2014-10-29 | Disposition: A | Discharge status: Home / Self Care | Payer: Medicare Other | Source: Ambulatory Visit | Attending: Interventional Cardiology | Admitting: Interventional Cardiology

## 2014-10-29 ENCOUNTER — Encounter (HOSPITAL_COMMUNITY)
Admission: RE | Disposition: A | Discharge status: Home / Self Care | Payer: Self-pay | Source: Ambulatory Visit | Attending: Interventional Cardiology

## 2014-10-29 ENCOUNTER — Encounter: Payer: Self-pay | Admitting: *Deleted

## 2014-10-29 ENCOUNTER — Encounter (HOSPITAL_COMMUNITY): Payer: Self-pay | Admitting: *Deleted

## 2014-10-29 DIAGNOSIS — R931 Abnormal findings on diagnostic imaging of heart and coronary circulation: Secondary | ICD-10-CM

## 2014-10-29 DIAGNOSIS — I10 Essential (primary) hypertension: Secondary | ICD-10-CM | POA: Insufficient documentation

## 2014-10-29 DIAGNOSIS — Z720 Tobacco use: Secondary | ICD-10-CM | POA: Diagnosis present

## 2014-10-29 DIAGNOSIS — I420 Dilated cardiomyopathy: Secondary | ICD-10-CM | POA: Insufficient documentation

## 2014-10-29 DIAGNOSIS — I447 Left bundle-branch block, unspecified: Secondary | ICD-10-CM | POA: Insufficient documentation

## 2014-10-29 DIAGNOSIS — I5020 Unspecified systolic (congestive) heart failure: Secondary | ICD-10-CM | POA: Insufficient documentation

## 2014-10-29 DIAGNOSIS — Z87891 Personal history of nicotine dependence: Secondary | ICD-10-CM | POA: Insufficient documentation

## 2014-10-29 DIAGNOSIS — Z7982 Long term (current) use of aspirin: Secondary | ICD-10-CM | POA: Insufficient documentation

## 2014-10-29 DIAGNOSIS — R9439 Abnormal result of other cardiovascular function study: Secondary | ICD-10-CM | POA: Diagnosis present

## 2014-10-29 HISTORY — PX: LEFT HEART CATHETERIZATION WITH CORONARY ANGIOGRAM: SHX5451

## 2014-10-29 LAB — POCT I-STAT 3, ART BLOOD GAS (G3+)
ACID-BASE DEFICIT: 4 mmol/L — AB (ref 0.0–2.0)
Bicarbonate: 20 mEq/L (ref 20.0–24.0)
O2 SAT: 97 %
PO2 ART: 88 mmHg (ref 80.0–100.0)
TCO2: 21 mmol/L (ref 0–100)
pCO2 arterial: 34.4 mmHg — ABNORMAL LOW (ref 35.0–45.0)
pH, Arterial: 7.373 (ref 7.350–7.450)

## 2014-10-29 LAB — POCT I-STAT 3, VENOUS BLOOD GAS (G3P V)
Acid-base deficit: 2 mmol/L (ref 0.0–2.0)
Bicarbonate: 22.9 mEq/L (ref 20.0–24.0)
O2 Saturation: 75 %
TCO2: 24 mmol/L (ref 0–100)
pCO2, Ven: 39.7 mmHg — ABNORMAL LOW (ref 45.0–50.0)
pH, Ven: 7.369 — ABNORMAL HIGH (ref 7.250–7.300)
pO2, Ven: 41 mmHg (ref 30.0–45.0)

## 2014-10-29 LAB — BASIC METABOLIC PANEL
Anion gap: 7 (ref 5–15)
BUN: 13 mg/dL (ref 6–23)
CHLORIDE: 107 mmol/L (ref 96–112)
CO2: 24 mmol/L (ref 19–32)
CREATININE: 0.86 mg/dL (ref 0.50–1.10)
Calcium: 9.8 mg/dL (ref 8.4–10.5)
GFR calc non Af Amer: 65 mL/min — ABNORMAL LOW (ref >90)
GFR, EST AFRICAN AMERICAN: 76 mL/min — AB (ref >90)
Glucose, Bld: 106 mg/dL — ABNORMAL HIGH (ref 70–99)
Potassium: 4 mmol/L (ref 3.5–5.1)
Sodium: 138 mmol/L (ref 135–145)

## 2014-10-29 LAB — CK TOTAL AND CKMB (NOT AT ARMC)
CK, MB: 0.9 ng/mL (ref 0.3–4.0)
Relative Index: INVALID (ref 0.0–2.5)
Total CK: 49 U/L (ref 7–177)

## 2014-10-29 LAB — PROTIME-INR
INR: 1 (ref 0.00–1.49)
Prothrombin Time: 13.3 seconds (ref 11.6–15.2)

## 2014-10-29 SURGERY — LEFT HEART CATHETERIZATION WITH CORONARY ANGIOGRAM
Anesthesia: LOCAL

## 2014-10-29 MED ORDER — HEPARIN (PORCINE) IN NACL 2-0.9 UNIT/ML-% IJ SOLN
INTRAMUSCULAR | Status: AC
  Filled 2014-10-29: qty 2000

## 2014-10-29 MED ORDER — FENTANYL CITRATE 0.05 MG/ML IJ SOLN
INTRAMUSCULAR | Status: AC
  Filled 2014-10-29: qty 2

## 2014-10-29 MED ORDER — SODIUM CHLORIDE 0.9 % IV SOLN: INTRAVENOUS | Status: DC

## 2014-10-29 MED ORDER — NITROGLYCERIN 1 MG/10 ML FOR IR/CATH LAB
INTRA_ARTERIAL | Status: AC
  Filled 2014-10-29: qty 10

## 2014-10-29 MED ORDER — METHYLPREDNISOLONE SODIUM SUCC 125 MG IJ SOLR
INTRAMUSCULAR | Status: AC
  Administered 2014-10-29: 125 mg via INTRAVENOUS
  Filled 2014-10-29: qty 2

## 2014-10-29 MED ORDER — CARVEDILOL 3.125 MG PO TABS: 6.2500 mg | ORAL_TABLET | Freq: Two times a day (BID) | ORAL | Status: DC

## 2014-10-29 MED ORDER — ASPIRIN 81 MG PO CHEW
CHEWABLE_TABLET | ORAL | Status: AC
  Filled 2014-10-29: qty 1

## 2014-10-29 MED ORDER — SODIUM CHLORIDE 0.9 % IJ SOLN: 3.0000 mL | Freq: Two times a day (BID) | INTRAMUSCULAR | Status: DC

## 2014-10-29 MED ORDER — IRBESARTAN 75 MG PO TABS: 75.0000 mg | ORAL_TABLET | Freq: Every day | ORAL | Status: DC

## 2014-10-29 MED ORDER — FAMOTIDINE IN NACL 20-0.9 MG/50ML-% IV SOLN
INTRAVENOUS | Status: AC
  Administered 2014-10-29: 20 mg via INTRAVENOUS
  Filled 2014-10-29: qty 50

## 2014-10-29 MED ORDER — METHYLPREDNISOLONE SODIUM SUCC 125 MG IJ SOLR
125.0000 mg | INTRAMUSCULAR | Status: AC
  Administered 2014-10-29: 125 mg via INTRAVENOUS

## 2014-10-29 MED ORDER — FAMOTIDINE IN NACL 20-0.9 MG/50ML-% IV SOLN
20.0000 mg | INTRAVENOUS | Status: AC
  Administered 2014-10-29: 20 mg via INTRAVENOUS

## 2014-10-29 MED ORDER — DIPHENHYDRAMINE HCL 50 MG/ML IJ SOLN
INTRAMUSCULAR | Status: AC
  Administered 2014-10-29: 25 mg via INTRAVENOUS
  Filled 2014-10-29: qty 1

## 2014-10-29 MED ORDER — ACETAMINOPHEN 325 MG PO TABS: 650.0000 mg | ORAL_TABLET | ORAL | Status: DC | PRN

## 2014-10-29 MED ORDER — ONDANSETRON HCL 4 MG/2ML IJ SOLN
4.0000 mg | Freq: Four times a day (QID) | INTRAMUSCULAR | Status: DC | PRN
Start: 2014-10-29 — End: 2014-10-29

## 2014-10-29 MED ORDER — VERAPAMIL HCL 2.5 MG/ML IV SOLN
INTRAVENOUS | Status: AC
  Filled 2014-10-29: qty 2

## 2014-10-29 MED ORDER — SODIUM CHLORIDE 0.9 % IJ SOLN: 3.0000 mL | INTRAMUSCULAR | Status: DC | PRN

## 2014-10-29 MED ORDER — OXYCODONE-ACETAMINOPHEN 5-325 MG PO TABS: 1.0000 | ORAL_TABLET | ORAL | Status: DC | PRN

## 2014-10-29 MED ORDER — LIDOCAINE HCL (PF) 1 % IJ SOLN
INTRAMUSCULAR | Status: AC
  Filled 2014-10-29: qty 30

## 2014-10-29 MED ORDER — SODIUM CHLORIDE 0.9 % IV SOLN
INTRAVENOUS | Status: DC
  Administered 2014-10-29: 09:00:00 via INTRAVENOUS

## 2014-10-29 MED ORDER — DIPHENHYDRAMINE HCL 50 MG/ML IJ SOLN
25.0000 mg | INTRAMUSCULAR | Status: AC
  Administered 2014-10-29: 25 mg via INTRAVENOUS

## 2014-10-29 MED ORDER — HEPARIN SODIUM (PORCINE) 1000 UNIT/ML IJ SOLN
INTRAMUSCULAR | Status: AC
  Filled 2014-10-29: qty 1

## 2014-10-29 MED ORDER — NITROGLYCERIN 0.4 MG SL SUBL: 0.4000 mg | SUBLINGUAL_TABLET | SUBLINGUAL | Status: DC | PRN

## 2014-10-29 MED ORDER — ASPIRIN 81 MG PO CHEW
81.0000 mg | CHEWABLE_TABLET | ORAL | Status: AC
  Administered 2014-10-29: 81 mg via ORAL

## 2014-10-29 MED ORDER — SODIUM CHLORIDE 0.9 % IV SOLN: 250.0000 mL | INTRAVENOUS | Status: DC | PRN

## 2014-10-29 MED ORDER — MIDAZOLAM HCL 2 MG/2ML IJ SOLN
INTRAMUSCULAR | Status: AC
  Filled 2014-10-29: qty 2

## 2014-10-29 MED ORDER — ASPIRIN EC 81 MG PO TBEC: 81.0000 mg | DELAYED_RELEASE_TABLET | Freq: Every day | ORAL | Status: DC

## 2014-10-29 NOTE — CV Procedure (Signed)
     Left and Right Heart Catheterization with Coronary Angiography Report  Torre Shutes  74 y.o.  female 1941-06-18  Procedure Date: 10/29/2014 Referring Physician: Bryan Lemma, M.D. Primary Cardiologist:: H WB Leia Alf, M.D.  INDICATIONS: New onset systolic heart failure with abnormal myocardial perfusion abnormality. The studies being done to exclude coronary disease as a cause of decreased LV function.  PROCEDURE: 1. Left heart catheterization; 2. Right heart catheterization; 3. Left ventriculography; 4. Coronary angiography  CONSENT:  The risks, benefits, and details of the procedure were explained in detail to the patient. Risks including death, stroke, heart attack, kidney injury, allergy, limb ischemia, bleeding and radiation injury were discussed.  The patient verbalized understanding and wanted to proceed.  Informed written consent was obtained.  PROCEDURE TECHNIQUE:  After Xylocaine anesthesia a 5 French Slender sheath was placed in the right radial artery with an angiocath and the modified Seldinger technique.  An 18-gauge Angiocath in the right antecubital vein was exchanged for a 5 French brachial sheath using the modified Seldinger technique and double cleft technique. Right heart cath was performed with a 5 French balloon tip catheter. Coronary angiography was done using a 5 F JR 4 and JL 3.5 cm diagnostic catheter.  Left ventriculography was done using the JR 4 catheter and hand injection.   After reviewing the angiography and hemodynamic recordings, the case was terminated.  Arterial hemostasis was achieved with a radial wrist Van at 15 cc of water   CONTRAST:  Total of 70 cc.  COMPLICATIONS:  None   HEMODYNAMICS:  Aortic pressure 188/71 mmHg; LV pressure 189 /12 mmHg; LVEDP 18 mmHg; RA 11 mmHg; RV 36/6 mmHg; PA 36/22 mmHg; PCWP(mean) 21 mmHg; Cardiac Output 5.2 L/m; AV gradient absent  ANGIOGRAPHIC DATA:   The left main coronary artery is normal.  The left  anterior descending artery is normal. LAD wraps around the left ventricular apex..  The left circumflex artery is widely patent. 2 obtuse marginal branches are noted. The vessel is normal.  The right coronary artery is dominant and normal..  LEFT VENTRICULOGRAM:  Left ventricular angiogram was done in the 30 RAO projection and revealed a dilated cavity with global hypokinesis (dyssynergy) and an estimated ejection fraction of 25%   IMPRESSIONS:  1. Normal coronary arteries. The coronaries are markedly tortuous compatible with long-term effects of hypertension. 2. Severe left ventricular systolic dysfunction with EF 25%.   RECOMMENDATION:  Titrate medical therapy. Consider resynchronization therapy of heart failure symptoms develop. Will discuss with EP. Consider AICD if no improvement in LV function with medical therapy Continue beta blocker and angiotensin receptor blocker therapy. May need low-dose diuretic therapy dependent upon blood pressure response on the medical regimen.Marland Kitchen

## 2014-10-29 NOTE — Discharge Instructions (Signed)
Radial Site Care °Refer to this sheet in the next few weeks. These instructions provide you with information on caring for yourself after your procedure. Your caregiver may also give you more specific instructions. Your treatment has been planned according to current medical practices, but problems sometimes occur. Call your caregiver if you have any problems or questions after your procedure. °HOME CARE INSTRUCTIONS °· You may shower the day after the procedure. Remove the bandage (dressing) and gently wash the site with plain soap and water. Gently pat the site dry. °· Do not apply powder or lotion to the site. °· Do not submerge the affected site in water for 3 to 5 days. °· Inspect the site at least twice daily. °· Do not flex or bend the affected arm for 24 hours. °· No lifting over 5 pounds (2.3 kg) for 5 days after your procedure. °· Do not drive home if you are discharged the same day of the procedure. Have someone else drive you. °· You may drive 24 hours after the procedure unless otherwise instructed by your caregiver. °· Do not operate machinery or power tools for 24 hours. °· A responsible adult should be with you for the first 24 hours after you arrive home. °What to expect: °· Any bruising will usually fade within 1 to 2 weeks. °· Blood that collects in the tissue (hematoma) may be painful to the touch. It should usually decrease in size and tenderness within 1 to 2 weeks. °SEEK IMMEDIATE MEDICAL CARE IF: °· You have unusual pain at the radial site. °· You have redness, warmth, swelling, or pain at the radial site. °· You have drainage (other than a small amount of blood on the dressing). °· You have chills. °· You have a fever or persistent symptoms for more than 72 hours. °· You have a fever and your symptoms suddenly get worse. °· Your arm becomes pale, cool, tingly, or numb. °· You have heavy bleeding from the site. Hold pressure on the site. °Document Released: 10/17/2010 Document Revised:  12/07/2011 Document Reviewed: 10/17/2010 °ExitCare® Patient Information ©2015 [employer]. This information is not intended to replace advice given to you by your health care provider. Make sure you discuss any questions you have with your health care provider. ° °

## 2014-10-29 NOTE — Progress Notes (Signed)
Patient ID: Nicole Chan, female   DOB: 01-Jan-1941, 73 y.o.   MRN: [id number] Patient did not show up for 10/29/14 8:30 AM appointment to have a 14 day cardiac event monitor applied.

## 2014-10-29 NOTE — H&P (View-Only) (Signed)
 Patient Name: Nicole Chan Date of Encounter: 10/26/2014     Principal Problem:   Episode of dizziness Active Problems:   LBBB (left bundle branch block)   Essential hypertension   Congestive dilated cardiomyopathy: New diagnosis    SUBJECTIVE  Waiting for stress test. Feeling fine. Just a little weak. No dizziness or CP .    CURRENT MEDS . aspirin EC  81 mg Oral Daily  . carvedilol  6.25 mg Oral BID WC  . enoxaparin (LOVENOX) injection  40 mg Subcutaneous Q24H  . irbesartan  75 mg Oral Daily    OBJECTIVE  Filed Vitals:   10/25/14 1514 10/25/14 2033 10/26/14 0350 10/26/14 0700  BP: 140/74 152/48 152/57 154/77  Pulse: 67 60 64 64  Temp: 98 F (36.7 C) 97.9 F (36.6 C) 97.9 F (36.6 C)   TempSrc: Oral Oral Oral   Resp: 18 18 18   Height:      Weight:      SpO2: 98% 96%      Intake/Output Summary (Last 24 hours) at 10/26/14 0749 Last data filed at 10/26/14 0700  Gross per 24 hour  Intake    120 ml  Output      0 ml  Net    120 ml   Filed Weights   10/24/14 1618 10/24/14 1925  Weight: 215 lb (97.523 kg) 236 lb 5.3 oz (107.2 kg)    PHYSICAL EXAM  General: Pleasant, NAD. Neuro: Alert and oriented X 3. Moves all extremities spontaneously. Psych: Normal affect. HEENT:  Normal  Neck: Supple without bruits or JVD. Lungs:  Resp regular and unlabored, CTA. Heart: brady. no s3, s4, or murmurs. Abdomen: Soft, non-tender, non-distended, BS + x 4.  Extremities: No clubbing, cyanosis or edema. DP/PT/Radials 2+ and equal bilaterally.  Accessory Clinical Findings  CBC  Recent Labs  10/24/14 1722  WBC 6.6  HGB 15.6*  HCT 46.2*  MCV 88.5  PLT 225   Basic Metabolic Panel  Recent Labs  10/24/14 1722  NA 138  K 4.2  CL 106  CO2 26  GLUCOSE 104*  BUN 14  CREATININE 1.02  CALCIUM 10.7*    Cardiac Enzymes  Recent Labs  10/24/14 2046 10/25/14 1345  TROPONINI <0.03 <0.03    Thyroid Function Tests  Recent Labs  10/24/14 1838  TSH  0.901    TELE  LBBB, sinus brady HR 50s.  Radiology/Studies  Dg Chest Port 1 View  10/24/2014   CLINICAL DATA:  Dizziness for 5 days, abnormal EKG, history hypertension, smoking  EXAM: PORTABLE CHEST - 1 VIEW  COMPARISON:  Portable exam 1654 hr without priors for comparison  FINDINGS: Enlargement of cardiac silhouette.  Mediastinal contours and pulmonary vascularity normal.  Lungs clear.  No pleural effusion or pneumothorax.  Mild atherosclerotic calcification aorta.  Bones unremarkable.  IMPRESSION: Enlargement of cardiac silhouette.  No acute abnormalities.   Electronically Signed   By: Nicole  Chan M.D.   On: 10/24/2014 17:03    ASSESSMENT AND PLAN  Nicole Chan is a 74 y.o. female with a history of HTN and tobacco abuse who presented to [facility] ED on 10/24/14 with recurrent episodes of dizziness.   Dizziness: Patient was admitted with atypical symptoms of dizziness and nausea. Concern was for possible arrhythmias. The initial thought would be if she ruled out for MI discharge plans to monitor. -- Tele reveals no arrhythmias, aside from some sinus bradycardia: HR 50s. -- TSH normal  -- Carotid dopplers Chan/ bilateral calcific plaque   origin ICA and ECA. 1-39% ICA stenosis. ICA/CCA ratio: Chan-1.0L-1.2.   Ischemic CM- newly diagnosed. 2D ECHO with EF: 30-35% with global hypokinesis and elevated filling pressures.  -- Placed on irbesartan 75mg and Coreg 6.25mg BID  Newly recognized LBBB- Troponin negative and no chest pain.   -- With LBBB and new cardiomyopathy, ischemia must be excluded. Plan for Lexiscan myoview today. If negative for ischemia she may be able to go home.   Hypertension: Well controlled on ARB and BB  History of cigarette smoking: Her hemoglobin is 15.6 which is a bit elevated. I suspect this is due to smoking. -- She is confident that she can quit on her own, but knows we are willing to prescribe nicotine patches or other cessation aids if she needs this in the future.     Signed, THOMPSON, KATHRYN R PA-C  Pager [phone]  I finally saw the patient after her nuclear stress test today. She was doing relatively well, but did have some mild dizziness this morning. It did not seem to be on telemetry that was untoward.  She was worked in for her stress test today (unfortunately had not been made nothing by mouth so she had breakfast delivered). Myoview unfortunately was noted to be abnormal with septal and lateral defects. While the septal defect could very well be related to left bundle branch block. The lateral defect is harder to explain. I discussed these findings with other colleagues and we all agree that probably the best course of action would be to proceed with invasive evaluation. She is however relatively asymptomatic from a ischemic standpoint and has not shown any significant arrhythmias. I therefore think she is probably stable for discharged home to return next week for right and left heart catheterization.  Her blood pressure would probably be able tolerate further titration of medications in the outpatient setting. She is now on ARB and beta blocker. This morning pressures were well-controlled.  She is scheduled for Monday morning 9 AM right left heart catheterization with Dr. Henry Chan (her primary cardiologist).  I initially indicated to the patient if she were to have recurrence or worsening of symptoms she should return directly back to the hospital. She is instructed to essentially stayed home and relax until her procedures performed. I don't think that there is a high risk for her to go home as she is asymptomatic from an ischemic standpoint. If she were to come to the clinic with this test would be scheduled as an outpatient cardiac catheterization. There was no evidence of ischemia on the nuclear stress test but suggestion of prior infarct.  Nicole Chan, M.D., M.S. Interventional Cardiologist   Pager #  [phone]           

## 2014-10-29 NOTE — Research (Signed)
BIO-FLOW V Informed Consent   Subject Name: Nicole Chan  Subject met inclusion and exclusion criteria.  The informed consent form, study requirements and expectations were reviewed with the subject and questions and concerns were addressed prior to the signing of the consent form.  The subject verbalized understanding of the trial requirements.  The subject agreed to participate in the BIO-FLOW V trial and signed the informed consent.  The informed consent was obtained prior to performance of any protocol-specific procedures for the subject.  A copy of the signed informed consent was given to the subject and a copy was placed in the subject's medical record.  Jennifer Knapp 10/29/2014, 12:56 PM  

## 2014-10-29 NOTE — Interval H&P Note (Signed)
Cath Lab Visit (complete for each Cath Lab visit)  Clinical Evaluation Leading to the Procedure:   ACS: No.  Non-ACS:    Anginal Classification: CCS III  Anti-ischemic medical therapy: No Therapy  Non-Invasive Test Results: Intermediate-risk stress test findings: cardiac mortality 1-3%/year  Prior CABG: No previous CABG      History and Physical Interval Note:  10/29/2014 7:31 AM  Nicole Chan  has presented today for surgery, with the diagnosis of cp  The various methods of treatment have been discussed with the patient and family. After consideration of risks, benefits and other options for treatment, the patient has consented to  Procedure(s): LEFT HEART CATHETERIZATION WITH CORONARY ANGIOGRAM (N/A) as a surgical intervention .  The patient's history has been reviewed, patient examined, no change in status, stable for surgery.  I have reviewed the patient's chart and labs.  Questions were answered to the patient's satisfaction.     Lesleigh Noe

## 2014-11-05 ENCOUNTER — Encounter (INDEPENDENT_AMBULATORY_CARE_PROVIDER_SITE_OTHER): Payer: Medicare Other

## 2014-11-05 ENCOUNTER — Encounter: Payer: Self-pay | Admitting: *Deleted

## 2014-11-05 DIAGNOSIS — R42 Dizziness and giddiness: Secondary | ICD-10-CM

## 2014-11-05 NOTE — Progress Notes (Signed)
Patient ID: Nicole Chan, female   DOB: November 07, 1940, 74 y.o.   MRN: [id number] [employer] 14 day cardiac event monitor applied to patient.

## 2014-11-12 ENCOUNTER — Ambulatory Visit (INDEPENDENT_AMBULATORY_CARE_PROVIDER_SITE_OTHER): Payer: Medicare Other | Admitting: Interventional Cardiology

## 2014-11-12 ENCOUNTER — Encounter: Payer: Self-pay | Admitting: Interventional Cardiology

## 2014-11-12 VITALS — BP 152/84 | HR 60 | Ht 67.0 in | Wt 233.4 lb

## 2014-11-12 DIAGNOSIS — Z720 Tobacco use: Secondary | ICD-10-CM

## 2014-11-12 DIAGNOSIS — I5022 Chronic systolic (congestive) heart failure: Secondary | ICD-10-CM

## 2014-11-12 DIAGNOSIS — I10 Essential (primary) hypertension: Secondary | ICD-10-CM

## 2014-11-12 DIAGNOSIS — I447 Left bundle-branch block, unspecified: Secondary | ICD-10-CM

## 2014-11-12 DIAGNOSIS — R42 Dizziness and giddiness: Secondary | ICD-10-CM

## 2014-11-12 MED ORDER — IRBESARTAN 150 MG PO TABS
150.0000 mg | ORAL_TABLET | Freq: Every day | ORAL | Status: DC
Start: 2014-11-12 — End: 2015-02-06

## 2014-11-12 NOTE — Progress Notes (Signed)
 Cardiology Office Note   Date:  11/12/2014   ID:  Nicole Chan, DOB 04/09/1941, MRN [id number]  PCP:  SMITH III,HENRY W, MD  Cardiologist:   SMITH III,HENRY W, MD   No chief complaint on file.     History of Present Illness: Nicole Chan is a 73 y.o. female who presents for nonischemic cardiomyopathy with with significant left ventricular hypertrophy. She was admitted to the hospital with recurring episodes of dizziness. She is wearing a continuous monitor. We have gotten no notification's of arrhythmia. She feels better. She denies dyspnea. No medication side effects. She has discontinued smoking.    Past Medical History  Diagnosis Date  . Hypertension   . Tobacco abuse   . LBBB (left bundle branch block)     a. noted on 10/24/14 admission for dizziness   . Episode of dizziness, secondary to cardiomyopathy possibly  10/25/2014  . Dilated cardiomyopathy     Past Surgical History  Procedure Laterality Date  . Abdominal hysterectomy    . Left heart catheterization with coronary angiogram N/A 10/29/2014    Procedure: LEFT HEART CATHETERIZATION WITH CORONARY ANGIOGRAM;  Surgeon: Henry W Smith III, MD;  Location: [facility] CATH LAB;  Service: Cardiovascular;  Laterality: N/A;     Current Outpatient Prescriptions  Medication Sig Dispense Refill  . aspirin EC 81 MG EC tablet Take 1 tablet (81 mg total) by mouth daily.    . carvedilol (COREG) 6.25 MG tablet Take 1 tablet (6.25 mg total) by mouth 2 (two) times daily with a meal. 60 tablet 6  . irbesartan (AVAPRO) 150 MG tablet Take 1 tablet (150 mg total) by mouth daily. 30 tablet 5  . nitroGLYCERIN (NITROSTAT) 0.4 MG SL tablet Place 1 tablet (0.4 mg total) under the tongue every 5 (five) minutes as needed for chest pain. 25 tablet 4   No current facility-administered medications for this visit.    Allergies:   Shellfish allergy    Social History:  The patient  reports that she has been smoking.  She does not have any smokeless  tobacco history on file. She reports that she does not drink alcohol.   Family History:  The patient's family history includes Arrhythmia in her mother; Diabetes in her mother; Hypertension in her mother.    ROS:  Please see the history of present illness.   Otherwise, review of systems are positive for fatigue.   All other systems are reviewed and negative.    PHYSICAL EXAM: VS:  BP 152/84 mmHg  Pulse 60  Ht 5' 7" (1.702 m)  Wt 233 lb 6.4 oz (105.87 kg)  BMI 36.55 kg/m2  SpO2 97% , BMI Body mass index is 36.55 kg/(m^2). GEN: Well nourished, well developed, in no acute distress HEENT: normal Neck: no JVD, carotid bruits, or masses Cardiac: RRR; no murmurs, rubs, or gallops,no edema  Respiratory:  clear to auscultation bilaterally, normal work of breathing GI: soft, nontender, nondistended, + BS MS: no deformity or atrophy Skin: warm and dry, no rash Neuro:  Strength and sensation are intact Psych: euthymic mood, full affect   EKG:  EKG is not ordered today.    Recent Labs: 10/24/2014: Hemoglobin 15.6*; Platelets 225; TSH 0.901 10/29/2014: BUN 13; Creatinine 0.86; Potassium 4.0; Sodium 138    Lipid Panel No results found for: CHOL, TRIG, HDL, CHOLHDL, VLDL, LDLCALC, LDLDIRECT    Wt Readings from Last 3 Encounters:  11/12/14 233 lb 6.4 oz (105.87 kg)  10/29/14 210 lb (95.255 kg)  10/24/14   236 lb 5.3 oz (107.2 kg)      Other studies Reviewed: Additional studies/ records that were reviewed today include: None   ASSESSMENT AND PLAN:  1.  Systolic heart failure, chronic, duration unknown. The condition is complicated by severe LVH and therefore we assume is related to long-standing hypertension. Recent coronary angiography did not demonstrate any evidence of obstructive coronary disease. She is tolerating the current medical regimen without difficulty. 2. Essential hypertension, poorly controlled   Current medicines are reviewed at length with the patient today.  The  patient does not have concerns regarding medicines.  The following changes have been made:  Increase irbesartan to 150 mg per day.  Labs/ tests ordered today include: After titration of herbal Sartin we will check a be met 7-10 days later   Orders Placed This Encounter  Procedures  . Basic metabolic panel     Disposition:   FU with H. Smith in 3 weeks   Signed, SMITH III,HENRY W, MD  11/12/2014 3:35 PM    [facility] [address], [place], [place]  [address] Phone: [phone]; Fax: [phone]   

## 2014-11-12 NOTE — Patient Instructions (Signed)
Your physician has recommended you make the following change in your medication:  1) INCREASE Avapro to 150mg  daily. An Rx has been sent to your pharmacy  Your physician recommends that you return for lab work in: 7-10 days (bmet)  Your physician recommends that you schedule a follow-up appointment in: 3-4 weeks with Dr.Smith

## 2014-11-19 ENCOUNTER — Other Ambulatory Visit (INDEPENDENT_AMBULATORY_CARE_PROVIDER_SITE_OTHER): Payer: Medicare Other | Admitting: *Deleted

## 2014-11-19 DIAGNOSIS — I10 Essential (primary) hypertension: Secondary | ICD-10-CM

## 2014-11-19 DIAGNOSIS — I5022 Chronic systolic (congestive) heart failure: Secondary | ICD-10-CM

## 2014-11-19 LAB — BASIC METABOLIC PANEL
BUN: 18 mg/dL (ref 6–23)
CO2: 27 meq/L (ref 19–32)
Calcium: 9.7 mg/dL (ref 8.4–10.5)
Chloride: 107 mEq/L (ref 96–112)
Creatinine, Ser: 0.86 mg/dL (ref 0.40–1.20)
GFR: 83.11 mL/min (ref >60.00)
Glucose, Bld: 153 mg/dL — ABNORMAL HIGH (ref 70–99)
Potassium: 4.2 mEq/L (ref 3.5–5.1)
Sodium: 139 mEq/L (ref 135–145)

## 2014-11-20 ENCOUNTER — Telehealth: Payer: Self-pay

## 2014-11-20 NOTE — Telephone Encounter (Signed)
pt aware of lab results.Labs are stable and normal.pt verbalized understanding.

## 2014-11-20 NOTE — Telephone Encounter (Signed)
-----   Message from Lesleigh Noe, MD sent at 11/19/2014  7:22 PM EST ----- Labs are stable and normal.

## 2014-12-05 ENCOUNTER — Telehealth: Payer: Self-pay

## 2014-12-05 NOTE — Telephone Encounter (Signed)
Pt aware of cardiac monitor results. -Normal Pt verbalized understanding

## 2014-12-10 ENCOUNTER — Ambulatory Visit: Payer: Medicare Other | Admitting: Interventional Cardiology

## 2015-01-10 ENCOUNTER — Telehealth: Payer: Self-pay | Admitting: Interventional Cardiology

## 2015-01-10 DIAGNOSIS — I10 Essential (primary) hypertension: Secondary | ICD-10-CM

## 2015-01-10 MED ORDER — HYDROCHLOROTHIAZIDE 12.5 MG PO CAPS: 12.5000 mg | ORAL_CAPSULE | Freq: Every day | ORAL | Status: DC

## 2015-01-10 NOTE — Telephone Encounter (Signed)
Pt aware of Dr.Smith's recommendation  Start hydrochlorothiazide 12.5 mg daily and that we eventually convert the Avapro to a combination Avapro HCTZ.  A basic metabolic panel in needs to be done 7-10 days after starting the HCTZ  Rx sent to pt pharmacy. Lab appt scheduled on 01/21/15  Pt will continue to monitor her bp at home  Pt agreeable and verbalized understanding.

## 2015-01-10 NOTE — Telephone Encounter (Signed)
called to give pt Dr.Smith's recommendations. lmtcb 

## 2015-01-10 NOTE — Telephone Encounter (Signed)
Follow UIp  Pt returning Lisa's phone call. Please call back and discuss.

## 2015-01-10 NOTE — Telephone Encounter (Signed)
The patient called with concerns about blood pressures being elevated on the systolic side. Most recent systolic blood pressures have been in the 150-160 range.  I recommend that we add hydrochlorothiazide 12.5 mg daily and that we eventually convert the Avapro to a combination Avapro HCTZ.  A basic metabolic panel in needs to be done 7-10 days after starting the HCTZ  Phone of preference: [phone]

## 2015-01-21 ENCOUNTER — Other Ambulatory Visit (INDEPENDENT_AMBULATORY_CARE_PROVIDER_SITE_OTHER): Payer: Medicare Other | Admitting: *Deleted

## 2015-01-21 DIAGNOSIS — I10 Essential (primary) hypertension: Secondary | ICD-10-CM | POA: Diagnosis not present

## 2015-01-21 LAB — BASIC METABOLIC PANEL
BUN: 25 mg/dL — ABNORMAL HIGH (ref 6–23)
CHLORIDE: 104 meq/L (ref 96–112)
CO2: 23 mEq/L (ref 19–32)
CREATININE: 0.99 mg/dL (ref 0.40–1.20)
Calcium: 10.2 mg/dL (ref 8.4–10.5)
GFR: 70.61 mL/min (ref >60.00)
Glucose, Bld: 140 mg/dL — ABNORMAL HIGH (ref 70–99)
POTASSIUM: 3.4 meq/L — AB (ref 3.5–5.1)
SODIUM: 135 meq/L (ref 135–145)

## 2015-01-23 ENCOUNTER — Telehealth: Payer: Self-pay

## 2015-01-23 NOTE — Telephone Encounter (Signed)
Pt aware of lab results with verbal understanding. Potassium is a little low. How is BP doing? If BP is still running > 140/90 mmHg. Pt reports that she cks her bp 1-2 weekly and her bp has been great <140/90. Adv her that I will update Dr.Smith She verbalized understanding.

## 2015-01-23 NOTE — Telephone Encounter (Signed)
-----   Message from Lyn Records, MD sent at 01/21/2015  5:45 PM EDT ----- Potassium is a little low. How is BP doing? If BP is still running > 140/90 mmHg, I would increase the Irbesartan to 300 mg daily. Let me know.

## 2015-01-25 ENCOUNTER — Telehealth: Payer: Self-pay

## 2015-01-25 NOTE — Telephone Encounter (Signed)
-----   Message from Lyn Records, MD sent at 01/25/2015  8:16 AM EDT ----- She should suppliment diet with foods rich in potassium(banana's, raisins, oranges, etc) Recheck potassium at next OV.

## 2015-01-25 NOTE — Telephone Encounter (Signed)
Pt aware of Dr.Smith's recommendations.  She should suppliment diet with foods rich in potassium(banana's, raisins, oranges, etc) We will recheck her potassium at her f/u appt in May Pt verbalized understanding.

## 2015-02-06 ENCOUNTER — Encounter: Payer: Self-pay | Admitting: Interventional Cardiology

## 2015-02-06 ENCOUNTER — Ambulatory Visit (INDEPENDENT_AMBULATORY_CARE_PROVIDER_SITE_OTHER): Payer: Medicare Other | Admitting: Interventional Cardiology

## 2015-02-06 VITALS — BP 180/100 | HR 85 | Ht 67.0 in | Wt 238.4 lb

## 2015-02-06 DIAGNOSIS — I5022 Chronic systolic (congestive) heart failure: Secondary | ICD-10-CM | POA: Diagnosis not present

## 2015-02-06 DIAGNOSIS — I10 Essential (primary) hypertension: Secondary | ICD-10-CM

## 2015-02-06 DIAGNOSIS — I447 Left bundle-branch block, unspecified: Secondary | ICD-10-CM | POA: Diagnosis not present

## 2015-02-06 LAB — BASIC METABOLIC PANEL
BUN: 22 mg/dL (ref 6–23)
CHLORIDE: 103 meq/L (ref 96–112)
CO2: 28 meq/L (ref 19–32)
Calcium: 10.7 mg/dL — ABNORMAL HIGH (ref 8.4–10.5)
Creatinine, Ser: 1.02 mg/dL (ref 0.40–1.20)
GFR: 68.21 mL/min (ref >60.00)
Glucose, Bld: 97 mg/dL (ref 70–99)
Potassium: 4.5 mEq/L (ref 3.5–5.1)
Sodium: 136 mEq/L (ref 135–145)

## 2015-02-06 MED ORDER — IRBESARTAN-HYDROCHLOROTHIAZIDE 300-12.5 MG PO TABS: 1.0000 | ORAL_TABLET | Freq: Every day | ORAL | Status: DC

## 2015-02-06 MED ORDER — CARVEDILOL 12.5 MG PO TABS: 12.5000 mg | ORAL_TABLET | Freq: Two times a day (BID) | ORAL | Status: DC

## 2015-02-06 NOTE — Patient Instructions (Signed)
Medication Instructions:  INCREASE Carvedilol to 12.5mg  Twice Daily COMPLETE your supply of Hctz and Avapro THEN START Avalide 300-12.5mg  daily. An Rx has been sent to your pharmacy. Call and let them know when you are ready for them to fill it.  Labwork: Bmet today  Testing/Procedures: None   Follow-Up: Your physician wants you to follow-up in: 3-4 months You will receive a reminder letter in the mail two months in advance. If you don't receive a letter, please call our office to schedule the follow-up appointment.   Any Other Special Instructions Will Be Listed Below (If Applicable).

## 2015-02-06 NOTE — Progress Notes (Signed)
Cardiology Office Note   Date:  02/06/2015   ID:  Derry Lory, DOB 03/23/1941, MRN [id number]  PCP:  Lesleigh Noe, MD  Cardiologist:  Lesleigh Noe, MD   Chief Complaint  Patient presents with  . Hypertension      History of Present Illness: Nicole Chan is a 74 y.o. female who presents for  Hypertensive heart disease, systolic heart failure with EF less than 35%, tobacco use, left bundle branch block.  Dizziness and other symptoms that were present at the time of admission have completely resolved. She is having no side effects on her current medical regimen. She was mildly hypokalemic the last time blood work was done. She is supplementing her potassium by nitroglycerin means.    Past Medical History  Diagnosis Date  . Hypertension   . Tobacco abuse   . LBBB (left bundle branch block)     a. noted on 10/24/14 admission for dizziness   . Episode of dizziness, secondary to cardiomyopathy possibly  10/25/2014  . Dilated cardiomyopathy     Past Surgical History  Procedure Laterality Date  . Abdominal hysterectomy    . Left heart catheterization with coronary angiogram N/A 10/29/2014    Procedure: LEFT HEART CATHETERIZATION WITH CORONARY ANGIOGRAM;  Surgeon: Lesleigh Noe, MD;  Location: [facility] CATH LAB;  Service: Cardiovascular;  Laterality: N/A;     Current Outpatient Prescriptions  Medication Sig Dispense Refill  . aspirin EC 81 MG EC tablet Take 1 tablet (81 mg total) by mouth daily.    . carvedilol (COREG) 6.25 MG tablet Take 1 tablet (6.25 mg total) by mouth 2 (two) times daily with a meal. 60 tablet 6  . hydrochlorothiazide (MICROZIDE) 12.5 MG capsule Take 1 capsule (12.5 mg total) by mouth daily. 30 capsule 5  . irbesartan (AVAPRO) 150 MG tablet Take 1 tablet (150 mg total) by mouth daily. 30 tablet 5  . nitroGLYCERIN (NITROSTAT) 0.4 MG SL tablet Place 1 tablet (0.4 mg total) under the tongue every 5 (five) minutes as needed for chest pain. 25 tablet  4   No current facility-administered medications for this visit.    Allergies:   Shellfish allergy    Social History:  The patient  reports that she has been smoking.  She has never used smokeless tobacco. She reports that she does not drink alcohol.   Family History:  The patient's family history includes Arrhythmia in her mother; Cancer in her father; Diabetes in her mother; Healthy in her paternal aunt; Heart disease in her mother; Hypertension in her mother; Lung cancer in her sister.    ROS:  Please see the history of present illness.   Otherwise, review of systems are positive for none.   All other systems are reviewed and negative.    PHYSICAL EXAM: VS:  BP 180/100 mmHg  Pulse 85  Ht  (1.702 m)  Wt 238 lb 6.4 oz (108.138 kg)  BMI 37.33 kg/m2  SpO2 97% , BMI Body mass index is 37.33 kg/(m^2). GEN: Well nourished, well developed, in no acute distress HEENT: normal Neck: no JVD, carotid bruits, or masses Cardiac: RRR; no murmurs, rubs, or gallops,no edema  Respiratory:  clear to auscultation bilaterally, normal work of breathing GI: soft, nontender, nondistended, + BS MS: no deformity or atrophy Skin: warm and dry, no rash Neuro:  Strength and sensation are intact Psych: euthymic mood, full affect   EKG:  EKG is not ordered today.    Recent Labs:  10/24/2014: Hemoglobin 15.6*; Platelets 225; TSH 0.901 01/21/2015: BUN 25*; Creatinine 0.99; Potassium 3.4*; Sodium 135    Lipid Panel No results found for: CHOL, TRIG, HDL, CHOLHDL, VLDL, LDLCALC, LDLDIRECT    Wt Readings from Last 3 Encounters:  02/06/15 238 lb 6.4 oz (108.138 kg)  11/12/14 233 lb 6.4 oz (105.87 kg)  10/29/14 210 lb (95.255 kg)      Other studies Reviewed: Additional studies/ records that were reviewed today include: .   ASSESSMENT AND PLAN:  Chronic systolic heart failure:   No evidence of volume overload  Essential hypertension : Elevated  LBBB (left bundle branch block)- not  reevaluated    Current medicines are reviewed at length with the patient today.  The patient does not have concerns regarding medicines.  The following changes have been made:   The BP is elevated. We will further titrate her therapy. Increase carvedilol to 12.5 mg twice a day. Starting in June we will change Avapro 150 mg and HCTZ 12.5 mg to Avalide 300/12.5 mg daily. We'll perform a basic metabolic panel today to make sure the potassium level has normalized.  Labs/ tests ordered today include:  No orders of the defined types were placed in this encounter.     Disposition:   FU with HS in 4 months  Signed, Lesleigh Noe, MD  02/06/2015 3:14 PM    [facility] [address] [place], [place], [place]  [address] Phone: [phone]; Fax: [phone]

## 2015-02-07 ENCOUNTER — Telehealth: Payer: Self-pay

## 2015-02-07 NOTE — Telephone Encounter (Signed)
Pt husband aware of lab results with verbal understanding Potassium is normal.

## 2015-02-07 NOTE — Telephone Encounter (Signed)
-----   Message from Lyn Records, MD sent at 02/06/2015  6:43 PM EDT ----- Potassium is normal.

## 2015-03-01 ENCOUNTER — Telehealth: Payer: Self-pay | Admitting: Interventional Cardiology

## 2015-03-01 DIAGNOSIS — I10 Essential (primary) hypertension: Secondary | ICD-10-CM

## 2015-03-01 MED ORDER — IRBESARTAN-HYDROCHLOROTHIAZIDE 300-12.5 MG PO TABS: 1.0000 | ORAL_TABLET | Freq: Every day | ORAL | Status: DC

## 2015-03-01 NOTE — Telephone Encounter (Signed)
Called patient about refill. Patient verbalized understanding.

## 2015-03-01 NOTE — Telephone Encounter (Signed)
Calling for refills on Irbesartan and Hctz. Will switch to  Avalide 300/12.5 mg daily. Needs 90 day supply with refills for 1 year.. Already has carvedilol. Explain to her that Irbesartan/Hctz(Avalide - generic) will replace irbesartan and hydrochlorothiazide.

## 2015-03-01 NOTE — Telephone Encounter (Signed)
Left message to call back. Refill has been ordered.

## 2015-06-05 DIAGNOSIS — I110 Hypertensive heart disease with heart failure: Secondary | ICD-10-CM | POA: Insufficient documentation

## 2015-06-05 NOTE — Progress Notes (Signed)
Cardiology Office Note   Date:  06/06/2015   ID:  Nicole Chan, DOB July 29, 1941, MRN [id number]  PCP:  Lesleigh Noe, MD  Cardiologist:  Lesleigh Noe, MD   Chief Complaint  Patient presents with  . Congestive Heart Failure      History of Present Illness: Nicole Chan is a 74 y.o. 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  No orthopnea, PND, or peripheral edema. No chest discomfort. Early symptoms of dizziness have resolved. Maintaining an active lifestyle without difficulty. No particular medication side effects.      Past Medical History  Diagnosis Date  . Hypertension   . Tobacco abuse   . LBBB (left bundle branch block)     a. noted on 10/24/14 admission for dizziness   . Episode of dizziness, secondary to cardiomyopathy possibly  10/25/2014  . Dilated cardiomyopathy     Past Surgical History  Procedure Laterality Date  . Abdominal hysterectomy    . Left heart catheterization with coronary angiogram N/A 10/29/2014    Procedure: LEFT HEART CATHETERIZATION WITH CORONARY ANGIOGRAM;  Surgeon: Lesleigh Noe, MD;  Location: [facility] CATH LAB;  Service: Cardiovascular;  Laterality: N/A;     Current Outpatient Prescriptions  Medication Sig Dispense Refill  . aspirin EC 81 MG EC tablet Take 1 tablet (81 mg total) by mouth daily.    . carvedilol (COREG) 12.5 MG tablet Take 1 tablet (12.5 mg total) by mouth 2 (two) times daily with a meal. 180 tablet 1  . irbesartan-hydrochlorothiazide (AVALIDE) 300-12.5 MG per tablet Take 1 tablet by mouth daily. 90 tablet 3  . nitroGLYCERIN (NITROSTAT) 0.4 MG SL tablet Place 1 tablet (0.4 mg total) under the tongue every 5 (five) minutes as needed for chest pain. 25 tablet 4   No current facility-administered medications for this visit.    Allergies:   Shellfish allergy    Social History:  The patient  reports  that she has been smoking.  She uses smokeless tobacco. She reports that she does not drink alcohol.   Family History:  The patient's family history includes Arrhythmia in her mother; Cancer in her father; Diabetes in her mother; Healthy in her paternal aunt; Heart disease in her mother; Hypertension in her mother; Lung cancer in her sister.    ROS:  Please see the history of present illness.   Otherwise, review of systems are positive for persistent sinus and upper respiratory congestion although improving..   All other systems are reviewed and negative.    PHYSICAL EXAM: VS:  BP 124/76 mmHg  Pulse 67  Ht 5' 7.5" (1.715 m)  Wt 106.595 kg (235 lb)  BMI 36.24 kg/m2  SpO2 97% , BMI Body mass index is 36.24 kg/(m^2). GEN: Well nourished, well developed, in no acute distress HEENT: normal Neck: no JVD, carotid bruits, or masses Cardiac: RRR.  There isno murmur, rub, or gallop. There is noma. Respiratory:  clear to auscultation bilaterally, normal work of breathing. GI: soft, nontender, nondistended, + BS MS: no deformity or atrophy Skin: warm and dry, no rash Neuro:  Strength and sensation are intact Psych: euthymic mood, full affect   EKG:  EKG is not performed today.    Recent Labs: 10/24/2014: Hemoglobin 15.6*; Platelets 225; TSH 0.901 02/06/2015: BUN 22; Creatinine, Ser 1.02; Potassium 4.5; Sodium 136    Lipid Panel No results found for: CHOL, TRIG, HDL, CHOLHDL, VLDL, LDLCALC, LDLDIRECT  Wt Readings from Last 3 Encounters:  06/06/15 106.595 kg (235 lb)  02/06/15 108.138 kg (238 lb 6.4 oz)  11/12/14 105.87 kg (233 lb 6.4 oz)      Other studies Reviewed: Additional studies/ records that were reviewed today include: Reviewed initial echocardiogram performed in January 2016. Marland Kitchen The findings include LVEF 30-35%.    ASSESSMENT AND PLAN:  1. Hypertensive heart disease with CHF Well-controlled blood pressure. No evidence of volume overload.   2. Chronic systolic heart  failure No limitations in physical activity. No volume overload.   3. Essential hypertension Excellent control.   4. LBBB (left bundle branch block)- new Not reassessed.    Current medicines are reviewed at length with the patient today.  The patient has the following concerns regarding medicines: None   The following changes/actions have been instituted:    2-D Doppler echo to assess LV function after optimization of medical therapy. Further decision points depending upon LV function.  Basic metabolic panel  Labs/ tests ordered today include:   Orders Placed This Encounter  Procedures  . Basic metabolic panel  . Echocardiogram     Disposition:   FU with HS in 5 months  Signed, Lesleigh Noe, MD  06/06/2015 10:32 AM    [facility] [address] [place], [place], [place]  [address] Phone: [phone]; Fax: [phone]

## 2015-06-06 ENCOUNTER — Ambulatory Visit (INDEPENDENT_AMBULATORY_CARE_PROVIDER_SITE_OTHER): Payer: Medicare Other | Admitting: Interventional Cardiology

## 2015-06-06 ENCOUNTER — Encounter: Payer: Self-pay | Admitting: Interventional Cardiology

## 2015-06-06 VITALS — BP 124/76 | HR 67 | Ht 67.5 in | Wt 235.0 lb

## 2015-06-06 DIAGNOSIS — I429 Cardiomyopathy, unspecified: Secondary | ICD-10-CM | POA: Diagnosis not present

## 2015-06-06 DIAGNOSIS — I10 Essential (primary) hypertension: Secondary | ICD-10-CM | POA: Diagnosis not present

## 2015-06-06 DIAGNOSIS — I447 Left bundle-branch block, unspecified: Secondary | ICD-10-CM

## 2015-06-06 DIAGNOSIS — I509 Heart failure, unspecified: Secondary | ICD-10-CM

## 2015-06-06 DIAGNOSIS — I110 Hypertensive heart disease with heart failure: Secondary | ICD-10-CM | POA: Diagnosis not present

## 2015-06-06 DIAGNOSIS — I428 Other cardiomyopathies: Secondary | ICD-10-CM

## 2015-06-06 DIAGNOSIS — I5022 Chronic systolic (congestive) heart failure: Secondary | ICD-10-CM | POA: Diagnosis not present

## 2015-06-06 LAB — BASIC METABOLIC PANEL
BUN: 21 mg/dL (ref 6–23)
CALCIUM: 10.3 mg/dL (ref 8.4–10.5)
CO2: 27 mEq/L (ref 19–32)
Chloride: 103 mEq/L (ref 96–112)
Creatinine, Ser: 1.01 mg/dL (ref 0.40–1.20)
GFR: 68.93 mL/min (ref >60.00)
GLUCOSE: 116 mg/dL — AB (ref 70–99)
Potassium: 3.4 mEq/L — ABNORMAL LOW (ref 3.5–5.1)
SODIUM: 138 meq/L (ref 135–145)

## 2015-06-06 NOTE — Patient Instructions (Signed)
Medication Instructions:  Your physician recommends that you continue on your current medications as directed. Please refer to the Current Medication list given to you today.   Labwork: Bmet today  Testing/Procedures: Your physician has requested that you have an echocardiogram. Echocardiography is a painless test that uses sound waves to create images of your heart. It provides your doctor with information about the size and shape of your heart and how well your heart's chambers and valves are working. This procedure takes approximately one hour. There are no restrictions for this procedure.   Follow-Up: Your physician wants you to follow-up in: 4-6 months with Dr.Smith You will receive a reminder letter in the mail two months in advance. If you don't receive a letter, please call our office to schedule the follow-up appointment.   Any Other Special Instructions Will Be Listed Below (If Applicable).

## 2015-06-12 ENCOUNTER — Telehealth: Payer: Self-pay

## 2015-06-12 DIAGNOSIS — R7309 Other abnormal glucose: Secondary | ICD-10-CM

## 2015-06-12 DIAGNOSIS — E876 Hypokalemia: Secondary | ICD-10-CM

## 2015-06-12 NOTE — Telephone Encounter (Signed)
Called to give pt lab results.lmtcb  

## 2015-06-12 NOTE — Telephone Encounter (Signed)
-----  Message from Belva Crome, MD sent at 06/07/2015  6:38 PM EDT ----- Low potassium and elevated blood sugar (is this followed by PCP?). Start K-dur 20 meq daily. Recheck B-met 3-[redacted] weeks along with Hemoglobin A1C.

## 2015-06-13 MED ORDER — POTASSIUM CHLORIDE CRYS ER 20 MEQ PO TBCR: 20.0000 meq | EXTENDED_RELEASE_TABLET | Freq: Every day | ORAL | Status: DC

## 2015-06-13 NOTE — Telephone Encounter (Signed)
Pt aware of lab results and Dr.Smith's recommendation. Low potassium and elevated blood sugar (is this followed by PCP?). Start K-dur 20 meq daily. Recheck B-met 3-[redacted] weeks along with Hemoglobin A1C.. Pt sts that she has recently established care with a pcp Dr.Kim Karlton Lemon. Results fwd to pcp. Rx sent to pt pharmacy for K-dur 80mq daily. Pt rqst we mail her a reminder to call back to schedule her lab appt. Lab recall in epic for 3-4 wk bmet and HgA1c

## 2015-06-13 NOTE — Telephone Encounter (Signed)
Follow up ° ° ° ° ° °Returning Nicole Chan's call °

## 2015-06-20 ENCOUNTER — Ambulatory Visit (HOSPITAL_COMMUNITY): Payer: Medicare Other | Attending: Cardiology

## 2015-06-20 ENCOUNTER — Other Ambulatory Visit: Payer: Self-pay

## 2015-06-20 DIAGNOSIS — I351 Nonrheumatic aortic (valve) insufficiency: Secondary | ICD-10-CM | POA: Insufficient documentation

## 2015-06-20 DIAGNOSIS — I428 Other cardiomyopathies: Secondary | ICD-10-CM

## 2015-06-20 DIAGNOSIS — I509 Heart failure, unspecified: Secondary | ICD-10-CM | POA: Insufficient documentation

## 2015-06-20 DIAGNOSIS — I517 Cardiomegaly: Secondary | ICD-10-CM | POA: Insufficient documentation

## 2015-06-20 DIAGNOSIS — I429 Cardiomyopathy, unspecified: Secondary | ICD-10-CM | POA: Diagnosis not present

## 2015-06-20 DIAGNOSIS — I110 Hypertensive heart disease with heart failure: Secondary | ICD-10-CM | POA: Diagnosis not present

## 2015-06-20 DIAGNOSIS — I5022 Chronic systolic (congestive) heart failure: Secondary | ICD-10-CM | POA: Diagnosis not present

## 2015-06-20 DIAGNOSIS — I059 Rheumatic mitral valve disease, unspecified: Secondary | ICD-10-CM | POA: Diagnosis not present

## 2015-06-21 ENCOUNTER — Telehealth: Payer: Self-pay

## 2015-06-21 NOTE — Telephone Encounter (Signed)
Pt aware of echo results and Dr.Smith's recommendations. The heart has not gotten any stronger. EF is 25-30%  We will need to discuss device therapy. Please schedule a time over the next 4 weeks for [place] to sit down and talk.   F/u.appt scheduled on 10/20@ 4pm   Pt verbalized understanding.

## 2015-06-21 NOTE — Telephone Encounter (Signed)
-----   Message from Lyn Records, MD sent at 06/20/2015  6:23 PM EDT ----- The heart has not gotten any stronger. EF is 25-30%  We will need to discuss device therapy. Please schedule a time over the next 4 weeks for [place] to sit down and talk.

## 2015-06-27 ENCOUNTER — Ambulatory Visit (INDEPENDENT_AMBULATORY_CARE_PROVIDER_SITE_OTHER): Payer: Medicare Other | Admitting: Internal Medicine

## 2015-06-27 ENCOUNTER — Encounter: Payer: Self-pay | Admitting: Internal Medicine

## 2015-06-27 ENCOUNTER — Encounter: Payer: Self-pay | Admitting: *Deleted

## 2015-06-27 VITALS — BP 130/90 | HR 66 | Ht 67.5 in | Wt 235.0 lb

## 2015-06-27 DIAGNOSIS — I10 Essential (primary) hypertension: Secondary | ICD-10-CM

## 2015-06-27 DIAGNOSIS — I5022 Chronic systolic (congestive) heart failure: Secondary | ICD-10-CM | POA: Diagnosis not present

## 2015-06-27 DIAGNOSIS — I447 Left bundle-branch block, unspecified: Secondary | ICD-10-CM

## 2015-06-27 LAB — BASIC METABOLIC PANEL
BUN: 31 mg/dL — ABNORMAL HIGH (ref 6–23)
CALCIUM: 10.3 mg/dL (ref 8.4–10.5)
CO2: 28 meq/L (ref 19–32)
CREATININE: 1.02 mg/dL (ref 0.40–1.20)
Chloride: 104 mEq/L (ref 96–112)
GFR: 68.14 mL/min (ref >60.00)
GLUCOSE: 111 mg/dL — AB (ref 70–99)
Potassium: 3.9 mEq/L (ref 3.5–5.1)
SODIUM: 139 meq/L (ref 135–145)

## 2015-06-27 LAB — CBC WITH DIFFERENTIAL/PLATELET
BASOS ABS: 0 10*3/uL (ref 0.0–0.1)
Basophils Relative: 0.5 % (ref 0.0–3.0)
EOS ABS: 0.2 10*3/uL (ref 0.0–0.7)
Eosinophils Relative: 2.9 % (ref 0.0–5.0)
HCT: 40.6 % (ref 36.0–46.0)
Hemoglobin: 13.7 g/dL (ref 12.0–15.0)
LYMPHS ABS: 1.9 10*3/uL (ref 0.7–4.0)
Lymphocytes Relative: 30 % (ref 12.0–46.0)
MCHC: 33.6 g/dL (ref 30.0–36.0)
MCV: 89.4 fl (ref 78.0–100.0)
Monocytes Absolute: 0.4 10*3/uL (ref 0.1–1.0)
Monocytes Relative: 7 % (ref 3.0–12.0)
NEUTROS ABS: 3.7 10*3/uL (ref 1.4–7.7)
NEUTROS PCT: 59.6 % (ref 43.0–77.0)
PLATELETS: 267 10*3/uL (ref 150.0–400.0)
RBC: 4.54 Mil/uL (ref 3.87–5.11)
RDW: 13.3 % (ref 11.5–15.5)
WBC: 6.2 10*3/uL (ref 4.0–10.5)

## 2015-06-27 MED ORDER — PREDNISONE 20 MG PO TABS: ORAL_TABLET | ORAL | Status: DC

## 2015-06-27 MED ORDER — FAMOTIDINE 20 MG PO TABS: ORAL_TABLET | ORAL | Status: DC

## 2015-06-27 NOTE — Assessment & Plan Note (Signed)
With LBBB, will plan for a BiV device

## 2015-06-27 NOTE — Assessment & Plan Note (Signed)
Her blood pressure is slightly elevated. Will follow.

## 2015-06-27 NOTE — Assessment & Plan Note (Signed)
She has class 2 symptoms on maximal medical therapy. I have discussed the treatment options with the patient. The risks/benefits/goals/expectations of BiV ICD implantation have been discussed with the patient and she wishes to proceed.

## 2015-06-27 NOTE — Patient Instructions (Signed)
Medication Instructions:  Your physician recommends that you continue on your current medications as directed. Please refer to the Current Medication list given to you today.   Labwork: Your physician recommends that you return for lab work today: BMP/CBC   Testing/Procedures: Your physician has recommended that you have a defibrillator inserted. An implantable cardioverter defibrillator (ICD) is a small device that is placed in your chest or, in rare cases, your abdomen. This device uses electrical pulses or shocks to help control life-threatening, irregular heartbeats that could lead the heart to suddenly stop beating (sudden cardiac arrest). Leads are attached to the ICD that goes into your heart. This is done in the hospital and usually requires an overnight stay. Please see the instruction sheet given to you today for more information.---SEE INSTRUCTION SHEET    Follow-Up: Your physician recommends that you schedule a follow-up appointment in: 10-14 days from 07/11/15 with device clinic for wound check and in 3 months from 07/11/15 with Dr Ladona Ridgel   Any Other Special Instructions Will Be Listed Below (If Applicable).

## 2015-06-27 NOTE — Progress Notes (Signed)
HPI Nicole Chan is referred today by Dr. Katrinka Blazing to consider insertion of a BiV ICD. She is a pleasant 74 yo woman with a h/o chronic systolic heart failure, initially diagnosed 10 months ago. Catheterization demonstrated no obstructive CAD. She has LBBB with a QRS duration of 180 ms. She was treated with maximal medical therapy under the direction of Dr. Katrinka Blazing. Most recent echo demonstrated that her EF has actually gone down from 35% to 25%. She has never had frank syncope but does experience occaisional episodes of dizziness. She has class 2 symptoms, mostly low output rather than vascular congestion.  Allergies  Allergen Reactions  . Shellfish Allergy Itching     Current Outpatient Prescriptions  Medication Sig Dispense Refill  . aspirin EC 81 MG EC tablet Take 1 tablet (81 mg total) by mouth daily.    . carvedilol (COREG) 12.5 MG tablet Take 1 tablet (12.5 mg total) by mouth 2 (two) times daily with a meal. 180 tablet 1  . irbesartan-hydrochlorothiazide (AVALIDE) 300-12.5 MG per tablet Take 1 tablet by mouth daily. 90 tablet 3  . nitroGLYCERIN (NITROSTAT) 0.4 MG SL tablet Place 1 tablet (0.4 mg total) under the tongue every 5 (five) minutes as needed for chest pain. 25 tablet 4  . potassium chloride SA (K-DUR,KLOR-CON) 20 MEQ tablet Take 1 tablet (20 mEq total) by mouth daily. 30 tablet 11  . Vitamin D, Ergocalciferol, (DRISDOL) 50000 UNITS CAPS capsule Take 50,000 Units by mouth every 7 (seven) days.     No current facility-administered medications for this visit.     Past Medical History  Diagnosis Date  . Hypertension   . Tobacco abuse   . LBBB (left bundle branch block)     a. noted on 10/24/14 admission for dizziness   . Episode of dizziness, secondary to cardiomyopathy possibly  10/25/2014  . Dilated cardiomyopathy     ROS:   All systems reviewed and negative except as noted in the HPI.   Past Surgical History  Procedure Laterality Date  . Abdominal hysterectomy     . Left heart catheterization with coronary angiogram N/A 10/29/2014    Procedure: LEFT HEART CATHETERIZATION WITH CORONARY ANGIOGRAM;  Surgeon: Lesleigh Noe, MD;  Location: [facility] CATH LAB;  Service: Cardiovascular;  Laterality: N/A;     Family History  Problem Relation Age of Onset  . Arrhythmia Mother   . Hypertension Mother   . Diabetes Mother   . Heart disease Mother   . Cancer Father   . Lung cancer Sister   . Healthy Paternal Aunt      Social History   Social History  . Marital Status: Married    Spouse Name: N/A  . Number of Children: N/A  . Years of Education: N/A   Occupational History  . Not on file.   Social History Main Topics  . Smoking status: Light Tobacco Smoker -- 0.50 packs/day  . Smokeless tobacco: Current User  . Alcohol Use: No     Comment: on special occasions  . Drug Use: Not on file  . Sexual Activity: Not on file   Other Topics Concern  . Not on file   Social History Narrative     BP 130/90 mmHg  Pulse 66  Ht 5' 7.5" (1.715 m)  Wt 235 lb (106.595 kg)  BMI 36.24 kg/m2  Physical Exam:  stable appearing 74 yo woman, NAD HEENT: Unremarkable Neck:  7 cm JVD, no thyromegally Back:  No CVA  tenderness Lungs:  Clear with no wheezes HEART:  Regular rate rhythm, no murmurs, no rubs, no clicks Abd:  soft, positive bowel sounds, no organomegally, no rebound, no guarding Ext:  2 plus pulses, no edema, no cyanosis, no clubbing Skin:  No rashes no nodules Neuro:  CN II through XII intact, motor grossly intact  EKG - nsr with LBBB  Assess/Plan:

## 2015-07-05 ENCOUNTER — Institutional Professional Consult (permissible substitution): Payer: Medicare Other | Admitting: Cardiology

## 2015-07-11 ENCOUNTER — Ambulatory Visit (HOSPITAL_COMMUNITY)
Admission: RE | Admit: 2015-07-11 | Discharge: 2015-07-12 | Disposition: A | Discharge status: Home / Self Care | Payer: Medicare Other | Source: Ambulatory Visit | Attending: Internal Medicine | Admitting: Internal Medicine

## 2015-07-11 ENCOUNTER — Encounter (HOSPITAL_COMMUNITY)
Admission: RE | Disposition: A | Discharge status: Home / Self Care | Payer: Self-pay | Source: Ambulatory Visit | Attending: Internal Medicine

## 2015-07-11 DIAGNOSIS — I10 Essential (primary) hypertension: Secondary | ICD-10-CM

## 2015-07-11 DIAGNOSIS — Z79899 Other long term (current) drug therapy: Secondary | ICD-10-CM | POA: Insufficient documentation

## 2015-07-11 DIAGNOSIS — Z7982 Long term (current) use of aspirin: Secondary | ICD-10-CM | POA: Diagnosis not present

## 2015-07-11 DIAGNOSIS — F17290 Nicotine dependence, other tobacco product, uncomplicated: Secondary | ICD-10-CM | POA: Insufficient documentation

## 2015-07-11 DIAGNOSIS — I447 Left bundle-branch block, unspecified: Secondary | ICD-10-CM | POA: Diagnosis not present

## 2015-07-11 DIAGNOSIS — Z95810 Presence of automatic (implantable) cardiac defibrillator: Secondary | ICD-10-CM

## 2015-07-11 DIAGNOSIS — I509 Heart failure, unspecified: Secondary | ICD-10-CM | POA: Diagnosis not present

## 2015-07-11 DIAGNOSIS — I429 Cardiomyopathy, unspecified: Secondary | ICD-10-CM | POA: Diagnosis not present

## 2015-07-11 DIAGNOSIS — I5022 Chronic systolic (congestive) heart failure: Secondary | ICD-10-CM | POA: Diagnosis present

## 2015-07-11 HISTORY — PX: EP IMPLANTABLE DEVICE: SHX172B

## 2015-07-11 HISTORY — DX: Unspecified osteoarthritis, unspecified site: M19.90

## 2015-07-11 HISTORY — PX: BI-VENTRICULAR IMPLANTABLE CARDIOVERTER DEFIBRILLATOR  (CRT-D): SHX5747

## 2015-07-11 LAB — SURGICAL PCR SCREEN
MRSA, PCR: NEGATIVE
Staphylococcus aureus: NEGATIVE

## 2015-07-11 SURGERY — BIV ICD INSERTION CRT-D

## 2015-07-11 MED ORDER — ATORVASTATIN CALCIUM 20 MG PO TABS
20.0000 mg | ORAL_TABLET | Freq: Every day | ORAL | Status: DC
  Administered 2015-07-11: 20 mg via ORAL
  Filled 2015-07-11: qty 1

## 2015-07-11 MED ORDER — MIDAZOLAM HCL 5 MG/5ML IJ SOLN
INTRAMUSCULAR | Status: AC
  Filled 2015-07-11: qty 25

## 2015-07-11 MED ORDER — HYDROCHLOROTHIAZIDE 12.5 MG PO CAPS
12.5000 mg | ORAL_CAPSULE | Freq: Every day | ORAL | Status: DC
  Administered 2015-07-11: 12.5 mg via ORAL
  Filled 2015-07-11 (×2): qty 1

## 2015-07-11 MED ORDER — FENTANYL CITRATE (PF) 100 MCG/2ML IJ SOLN
INTRAMUSCULAR | Status: AC
  Filled 2015-07-11: qty 4

## 2015-07-11 MED ORDER — VITAMIN D (ERGOCALCIFEROL) 1.25 MG (50000 UNIT) PO CAPS: 50000.0000 [IU] | ORAL_CAPSULE | ORAL | Status: DC

## 2015-07-11 MED ORDER — FENTANYL CITRATE (PF) 100 MCG/2ML IJ SOLN
INTRAMUSCULAR | Status: DC | PRN
  Administered 2015-07-11 (×3): 12.5 ug via INTRAVENOUS
  Administered 2015-07-11: 25 ug via INTRAVENOUS
  Administered 2015-07-11: 12.5 ug via INTRAVENOUS
  Administered 2015-07-11: 25 ug via INTRAVENOUS

## 2015-07-11 MED ORDER — POTASSIUM CHLORIDE CRYS ER 20 MEQ PO TBCR: 20.0000 meq | EXTENDED_RELEASE_TABLET | ORAL | Status: DC

## 2015-07-11 MED ORDER — BENZONATATE 100 MG PO CAPS: 100.0000 mg | ORAL_CAPSULE | Freq: Three times a day (TID) | ORAL | Status: DC | PRN

## 2015-07-11 MED ORDER — MIDAZOLAM HCL 5 MG/5ML IJ SOLN
INTRAMUSCULAR | Status: DC | PRN
  Administered 2015-07-11: 1 mg via INTRAVENOUS
  Administered 2015-07-11 (×2): 2 mg via INTRAVENOUS
  Administered 2015-07-11 (×3): 1 mg via INTRAVENOUS

## 2015-07-11 MED ORDER — POTASSIUM CHLORIDE CRYS ER 20 MEQ PO TBCR: 20.0000 meq | EXTENDED_RELEASE_TABLET | Freq: Every day | ORAL | Status: DC

## 2015-07-11 MED ORDER — SODIUM CHLORIDE 0.9 % IR SOLN
Status: AC
  Filled 2015-07-11: qty 2

## 2015-07-11 MED ORDER — ONDANSETRON HCL 4 MG/2ML IJ SOLN: 4.0000 mg | Freq: Four times a day (QID) | INTRAMUSCULAR | Status: DC | PRN

## 2015-07-11 MED ORDER — HEPARIN (PORCINE) IN NACL 2-0.9 UNIT/ML-% IJ SOLN
INTRAMUSCULAR | Status: DC | PRN
  Administered 2015-07-11: 13:00:00

## 2015-07-11 MED ORDER — SODIUM CHLORIDE 0.9 % IV SOLN
INTRAVENOUS | Status: DC
  Administered 2015-07-11: 11:00:00 via INTRAVENOUS

## 2015-07-11 MED ORDER — CEFAZOLIN SODIUM-DEXTROSE 2-3 GM-% IV SOLR
2.0000 g | INTRAVENOUS | Status: AC
  Administered 2015-07-11: 2 g via INTRAVENOUS

## 2015-07-11 MED ORDER — SODIUM CHLORIDE 0.9 % IR SOLN
80.0000 mg | Status: AC
  Administered 2015-07-11: 80 mg
  Filled 2015-07-11: qty 2

## 2015-07-11 MED ORDER — MUPIROCIN 2 % EX OINT
1.0000 "application " | TOPICAL_OINTMENT | Freq: Once | CUTANEOUS | Status: DC
  Filled 2015-07-11: qty 22

## 2015-07-11 MED ORDER — CEFAZOLIN SODIUM-DEXTROSE 2-3 GM-% IV SOLR
INTRAVENOUS | Status: AC
  Filled 2015-07-11: qty 50

## 2015-07-11 MED ORDER — IRBESARTAN 300 MG PO TABS
300.0000 mg | ORAL_TABLET | Freq: Every day | ORAL | Status: DC
  Administered 2015-07-11: 300 mg via ORAL
  Filled 2015-07-11: qty 1

## 2015-07-11 MED ORDER — IOHEXOL 350 MG/ML SOLN
INTRAVENOUS | Status: DC | PRN
  Administered 2015-07-11: 20 mL via INTRACARDIAC

## 2015-07-11 MED ORDER — LIDOCAINE HCL (PF) 1 % IJ SOLN
INTRAMUSCULAR | Status: DC | PRN
  Administered 2015-07-11: 50 mL via INTRADERMAL

## 2015-07-11 MED ORDER — ASPIRIN EC 81 MG PO TBEC
81.0000 mg | DELAYED_RELEASE_TABLET | Freq: Every day | ORAL | Status: DC
  Administered 2015-07-11: 81 mg via ORAL
  Filled 2015-07-11: qty 1

## 2015-07-11 MED ORDER — CHLORHEXIDINE GLUCONATE 4 % EX LIQD
60.0000 mL | Freq: Once | CUTANEOUS | Status: DC
  Filled 2015-07-11: qty 60

## 2015-07-11 MED ORDER — CEFAZOLIN SODIUM 1-5 GM-% IV SOLN
1.0000 g | Freq: Four times a day (QID) | INTRAVENOUS | Status: AC
  Administered 2015-07-11 – 2015-07-12 (×3): 1 g via INTRAVENOUS
  Filled 2015-07-11 (×3): qty 50

## 2015-07-11 MED ORDER — IRBESARTAN-HYDROCHLOROTHIAZIDE 300-12.5 MG PO TABS: 1.0000 | ORAL_TABLET | Freq: Every day | ORAL | Status: DC

## 2015-07-11 MED ORDER — LIDOCAINE HCL (PF) 1 % IJ SOLN
INTRAMUSCULAR | Status: AC
  Filled 2015-07-11: qty 30

## 2015-07-11 MED ORDER — ACETAMINOPHEN 325 MG PO TABS: 325.0000 mg | ORAL_TABLET | ORAL | Status: DC | PRN

## 2015-07-11 MED ORDER — NITROGLYCERIN 0.4 MG SL SUBL: 0.4000 mg | SUBLINGUAL_TABLET | SUBLINGUAL | Status: DC | PRN

## 2015-07-11 MED ORDER — MUPIROCIN 2 % EX OINT
TOPICAL_OINTMENT | CUTANEOUS | Status: AC
Start: 2015-07-11 — End: 2015-07-11
  Filled 2015-07-11: qty 22

## 2015-07-11 MED ORDER — CARVEDILOL 12.5 MG PO TABS
12.5000 mg | ORAL_TABLET | Freq: Two times a day (BID) | ORAL | Status: DC
  Administered 2015-07-11 – 2015-07-12 (×2): 12.5 mg via ORAL
  Filled 2015-07-11 (×2): qty 1

## 2015-07-11 SURGICAL SUPPLY — 19 items
ADAPTER SEALING SSA-EW-09 (MISCELLANEOUS) ×1 IMPLANT
ADPR INTRO LNG 9FR SL XTD WNG (MISCELLANEOUS) ×1
AMPLIA MRI QUAD DTMB1QQ (ICD Generator) ×1 IMPLANT
CABLE SURGICAL S-101-97-12 (CABLE) ×1 IMPLANT
CATH ATTAIN LDS 6216A-MB2 (CATHETERS) ×1 IMPLANT
CATH HEX JOSEPH 2-5-2 65CM 6F (CATHETERS) ×1 IMPLANT
KIT ESSENTIALS PG (KITS) ×2 IMPLANT
LEAD ATTAIN PERFORM ST 4398-88 (Lead) ×1 IMPLANT
LEAD CAPSURE NOVUS 5076-52CM (Lead) ×1 IMPLANT
LEAD SPRINT QUAT SEC 6935M-62 (Lead) ×1 IMPLANT
PAD DEFIB LIFELINK (PAD) ×1 IMPLANT
SHEATH CLASSIC 7F (SHEATH) ×1 IMPLANT
SHEATH CLASSIC 9.5F (SHEATH) ×1 IMPLANT
SHEATH CLASSIC 9F (SHEATH) ×1 IMPLANT
SHIELD RADPAD SCOOP 12X17 (MISCELLANEOUS) ×2 IMPLANT
SLITTER 6232ADJ (MISCELLANEOUS) ×1 IMPLANT
TOOL TRANSVALV INSERT TVI-07 (MISCELLANEOUS) ×2 IMPLANT
TRAY PACEMAKER INSERTION (CUSTOM PROCEDURE TRAY) ×1 IMPLANT
WIRE MAILMAN 182CM (WIRE) ×2 IMPLANT

## 2015-07-11 NOTE — Interval H&P Note (Signed)
History and Physical Interval Note:  07/11/2015 12:22 PM  Nicole Chan  has presented today for surgery, with the diagnosis of hf  The various methods of treatment have been discussed with the patient and family. After consideration of risks, benefits and other options for treatment, the patient has consented to  Procedure(s): BiV ICD Insertion CRT-D (N/A) as a surgical intervention .  The patient's history has been reviewed, patient examined, no change in status, stable for surgery.  I have reviewed the patient's chart and labs.  Questions were answered to the patient's satisfaction.     Elza Sortor   

## 2015-07-11 NOTE — Interval H&P Note (Signed)
History and Physical Interval Note:  07/11/2015 12:22 PM  Nicole Chan  has presented today for surgery, with the diagnosis of hf  The various methods of treatment have been discussed with the patient and family. After consideration of risks, benefits and other options for treatment, the patient has consented to  Procedure(s): BiV ICD Insertion CRT-D (N/A) as a surgical intervention .  The patient's history has been reviewed, patient examined, no change in status, stable for surgery.  I have reviewed the patient's chart and labs.  Questions were answered to the patient's satisfaction.     Lewayne Bunting

## 2015-07-11 NOTE — Progress Notes (Signed)
Orthopedic Tech Progress Note Patient Details:  Nicole Chan Sep 15, 1941 [id number]  Ortho Devices Type of Ortho Device: Arm sling   Saul Fordyce 07/11/2015, 5:10 PM

## 2015-07-11 NOTE — Discharge Summary (Signed)
ELECTROPHYSIOLOGY PROCEDURE DISCHARGE SUMMARY    Patient ID: Nicole Chan,  MRN: [id number], DOB/AGE: [birth year] 74 y.o.  Admit date: 07/11/2015 Discharge date: 07/12/2015  Primary Care Physician: Alva Garnet., MD Primary Cardiologist: Katrinka Blazing Electrophysiologist: Ladona Ridgel  Primary Discharge Diagnosis:  NICM, CHF, LBBB status post CRTD implant this admission  Secondary Discharge Diagnosis:  1.  Hypertension  Allergies  Allergen Reactions  . Shellfish Allergy Itching     Procedures This Admission:  1.  Implantation of a MDT CRTD on 07-11-15 by Dr Ladona Ridgel.  See op note for full details. DFT's were deferred at time of implant.  There were no immediate post procedure complications. 2.  CXR on 07-12-15 demonstrated no pneumothorax status post device implantation.   Brief HPI: Nicole Chan is a 74 y.o. female was referred to electrophysiology in the outpatient setting for consideration of ICD implantation.  Past medical history includes NICM, CHF, and LBBB.  The patient has persistent LV dysfunction despite guideline directed therapy.  Risks, benefits, and alternatives to CRTD implantation were reviewed with the patient who wished to proceed.   Hospital Course:  The patient was admitted and underwent implantation of a MDT CRTD with details as outlined above. She was monitored on telemetry overnight which demonstrated sinus rhythm with V pacing.  Left chest was without hematoma or ecchymosis.  The device was interrogated and found to be functioning normally.  CXR was obtained and demonstrated no pneumothorax status post device implantation.  Wound care, arm mobility, and restrictions were reviewed with the patient.  The patient was examined and considered stable for discharge to home.   The patient's discharge medications include an ARB (Irbesartan) and beta blocker (Coreg).   Physical Exam: Filed Vitals:   07/11/15 1730 07/11/15 2051 07/12/15 0152 07/12/15 0501  BP:  149/74 116/2 123/94 144/60  Pulse: 99 96 64 70  Temp:  98 F (36.7 C) 97.8 F (36.6 C) 97.8 F (36.6 C)  TempSrc:  Oral Oral Oral  Resp:  20 18 17   Height:      Weight:    234 lb 2.1 oz (106.2 kg)  SpO2:  96% 98% 98%    GEN- The patient is well appearing, alert and oriented x 3 today.   HEENT: normocephalic, atraumatic; sclera clear, conjunctiva pink; hearing intact; oropharynx clear; neck supple  Lungs- Clear to ausculation bilaterally, normal work of breathing.  No wheezes, rales, rhonchi Heart- Regular rate and rhythm (paced) GI- soft, non-tender, non-distended, bowel sounds present  Extremities- no clubbing, cyanosis, or edema; DP/PT/radial pulses 2+ bilaterally MS- no significant deformity or atrophy Skin- warm and dry, no rash or lesion, left chest without hematoma/ecchymosis Psych- euthymic mood, full affect Neuro- strength and sensation are intact   Labs:   Lab Results  Component Value Date   WBC 6.2 06/27/2015   HGB 13.7 06/27/2015   HCT 40.6 06/27/2015   MCV 89.4 06/27/2015   PLT 267.0 06/27/2015   No results for input(s): NA, K, CL, CO2, BUN, CREATININE, CALCIUM, PROT, BILITOT, ALKPHOS, ALT, AST, GLUCOSE in the last 168 hours.  Invalid input(s): LABALBU  Discharge Medications:    Medication List    TAKE these medications        aspirin 81 MG EC tablet  Take 1 tablet (81 mg total) by mouth daily.     atorvastatin 20 MG tablet  Commonly known as:  LIPITOR  Take 20 mg by mouth daily.     benzonatate 100 MG capsule  Commonly known as:  TESSALON  Take 100 mg by mouth 3 (three) times daily as needed for cough.     carvedilol 12.5 MG tablet  Commonly known as:  COREG  Take 1 tablet (12.5 mg total) by mouth 2 (two) times daily with a meal.     irbesartan-hydrochlorothiazide 300-12.5 MG tablet  Commonly known as:  AVALIDE  Take 1 tablet by mouth daily.     nitroGLYCERIN 0.4 MG SL tablet  Commonly known as:  NITROSTAT  Place 1 tablet (0.4 mg total)  under the tongue every 5 (five) minutes as needed for chest pain.     potassium chloride SA 20 MEQ tablet  Commonly known as:  K-DUR,KLOR-CON  Take 1 tablet (20 mEq total) by mouth daily.     predniSONE 20 MG tablet  Commonly known as:  DELTASONE  Take 3 tablets by mouth the pm prior to procedure and again the am of procedure     Vitamin D (Ergocalciferol) 50000 UNITS Caps capsule  Commonly known as:  DRISDOL  Take 50,000 Units by mouth 2 (two) times a week.        Disposition:  Discharge Instructions    Diet - low sodium heart healthy    Complete by:  As directed      Increase activity slowly    Complete by:  As directed           Follow-up Information    Follow up with [facility] On 07/22/2015.   Why:  at 10AM for wound check   Contact information:   [address] [place] [address]       Follow up with Lewayne Bunting, MD On 10/11/2015.   Specialty:  Cardiology   Why:  at 9:15AM   Contact information:   [address] [place] [address] [phone]       Duration of Discharge Encounter: Greater than 30 minutes including physician time.  Signed, Gypsy Balsam, NP 07/12/2015 6:48 AM   EP Attending  Patient seen and examined. Agree with above exam assessment and plan. She is doing well s/p BiV ICD implant. Ok for discharge home. Usual followup.   Leonia Reeves.D.

## 2015-07-11 NOTE — Discharge Instructions (Signed)
° ° °  Supplemental Discharge Instructions for  Pacemaker/Defibrillator Patients  Activity No heavy lifting or vigorous activity with your left/right arm for 6 to 8 weeks.  Do not raise your left/right arm above your head for one week.  Gradually raise your affected arm as drawn below.           __        07/16/15               07/17/15                07/18/15                     07/19/15  NO DRIVING for   1 week  ; you may begin driving on  30/13/14   .  WOUND CARE - Keep the wound area clean and dry.  Do not get this area wet for one week. No showers for one week; you may shower on 07/19/15    . - The tape/steri-strips on your wound will fall off; do not pull them off.  No bandage is needed on the site.  DO  NOT apply any creams, oils, or ointments to the wound area. - If you notice any drainage or discharge from the wound, any swelling or bruising at the site, or you develop a fever > 101? F after you are discharged home, call the office at once.  Special Instructions - You are still able to use cellular telephones; use the ear opposite the side where you have your pacemaker/defibrillator.  Avoid carrying your cellular phone near your device. - When traveling through airports, show security personnel your identification card to avoid being screened in the metal detectors.  Ask the security personnel to use the hand wand. - Avoid arc welding equipment, MRI testing (magnetic resonance imaging), TENS units (transcutaneous nerve stimulators).  Call the office for questions about other devices. - Avoid electrical appliances that are in poor condition or are not properly grounded. - Microwave ovens are safe to be near or to operate.  Additional information for defibrillator patients should your device go off: - If your device goes off ONCE and you feel fine afterward, notify the device clinic nurses. - If your device goes off ONCE and you do not feel well afterward, call 911. - If your device  goes off TWICE, call 911. - If your device goes off THREE times in one day, call 911.  DO NOT DRIVE YOURSELF OR A FAMILY MEMBER WITH A DEFIBRILLATOR TO THE HOSPITAL--CALL 911.

## 2015-07-11 NOTE — H&P (Signed)
  ICD Criteria  Current LVEF:25%. Within 12 months prior to implant: Yes   Heart failure history: Yes, Class III  Cardiomyopathy history: Yes, Non-Ischemic Cardiomyopathy.  Atrial Fibrillation/Atrial Flutter: No.  Ventricular tachycardia history: No.  Cardiac arrest history: No.  History of syndromes with risk of sudden death: No.  Previous ICD: No.  Current ICD indication: Primary  PPM indication: No.   Class I or II Bradycardia indication present: No  Beta Blocker therapy for 3 or more months: Yes, prescribed.   Ace Inhibitor/ARB therapy for 3 or more months: Yes, prescribed.    

## 2015-07-11 NOTE — H&P (View-Only) (Signed)
HPI Ms. Fecher is referred today by Dr.Spargurrinka Blazing to consider insertion of a BiV ICD. She is a pleasant 74 yo woman with a h/o chronic systolic heart failure, initially diagnosed 10 months ago. Catheterization demonstrated no obstructive CAD. She has LBBB with a QRS duration of 180 ms. She was treated with maximal medical therapy under the direction of Dr. Katrinka Blazing. Most recent echo demonstrated that her EF has actually gone down from 35% to 25%. She has never had frank syncope but does experience occaisional episodes of dizziness. She has class 2 symptoms, mostly low output rather than vascular congestion.  Allergies  Allergen Reactions  . Shellfish Allergy Itching     Current Outpatient Prescriptions  Medication Sig Dispense Refill  . aspirin EC 81 MG EC tablet Take 1 tablet (81 mg total) by mouth daily.    . carvedilol (COREG) 12.5 MG tablet Take 1 tablet (12.5 mg total) by mouth 2 (two) times daily with a meal. 180 tablet 1  . irbesartan-hydrochlorothiazide (AVALIDE) 300-12.5 MG per tablet Take 1 tablet by mouth daily. 90 tablet 3  . nitroGLYCERIN (NITROSTAT) 0.4 MG SL tablet Place 1 tablet (0.4 mg total) under the tongue every 5 (five) minutes as needed for chest pain. 25 tablet 4  . potassium chloride SA (K-DUR,KLOR-CON) 20 MEQ tablet Take 1 tablet (20 mEq total) by mouth daily. 30 tablet 11  . Vitamin D, Ergocalciferol, (DRISDOL) 50000 UNITS CAPS capsule Take 50,000 Units by mouth every 7 (seven) days.     No current facility-administered medications for this visit.     Past Medical History  Diagnosis Date  . Hypertension   . Tobacco abuse   . LBBB (left bundle branch block)     a. noted on 10/24/14 admission for dizziness   . Episode of dizziness, secondary to cardiomyopathy possibly  10/25/2014  . Dilated cardiomyopathy     ROS:   All systems reviewed and negative except as noted in the HPI.   Past Surgical History  Procedure Laterality Date  . Abdominal hysterectomy     . Left heart catheterization with coronary angiogram N/A 10/29/2014    Procedure: LEFT HEART CATHETERIZATION WITH CORONARY ANGIOGRAM;  Surgeon: Lesleigh Noe, MD;  Location: [facility] CATH LAB;  Service: Cardiovascular;  Laterality: N/A;     Family History  Problem Relation Age of Onset  . Arrhythmia Mother   . Hypertension Mother   . Diabetes Mother   . Heart disease Mother   . Cancer Father   . Lung cancer Sister   . Healthy Paternal Aunt      Social History   Social History  . Marital Status: Married    Spouse Name: N/A  . Number of Children: N/A  . Years of Education: N/A   Occupational History  . Not on file.   Social History Main Topics  . Smoking status: Light Tobacco Smoker -- 0.50 packs/day  . Smokeless tobacco: Current User  . Alcohol Use: No     Comment: on special occasions  . Drug Use: Not on file  . Sexual Activity: Not on file   Other Topics Concern  . Not on file   Social History Narrative     BP 130/90 mmHg  Pulse 66  Ht 5' 7.5" (1.715 m)  Wt 235 lb (106.595 kg)  BMI 36.24 kg/m2  Physical Exam:  stable appearing 74 yo woman, NAD HEENT: Unremarkable Neck:  7 cm JVD, no thyromegally Back:  No CVA  tenderness Lungs:  Clear with no wheezes HEART:  Regular rate rhythm, no murmurs, no rubs, no clicks Abd:  soft, positive bowel sounds, no organomegally, no rebound, no guarding Ext:  2 plus pulses, no edema, no cyanosis, no clubbing Skin:  No rashes no nodules Neuro:  CN II through XII intact, motor grossly intact  EKG - nsr with LBBB  Assess/Plan:

## 2015-07-12 ENCOUNTER — Ambulatory Visit (HOSPITAL_COMMUNITY): Payer: Medicare Other

## 2015-07-12 ENCOUNTER — Encounter (HOSPITAL_COMMUNITY): Payer: Self-pay | Admitting: General Practice

## 2015-07-12 DIAGNOSIS — I509 Heart failure, unspecified: Secondary | ICD-10-CM | POA: Diagnosis not present

## 2015-07-12 DIAGNOSIS — I429 Cardiomyopathy, unspecified: Secondary | ICD-10-CM | POA: Diagnosis not present

## 2015-07-12 DIAGNOSIS — I447 Left bundle-branch block, unspecified: Secondary | ICD-10-CM | POA: Diagnosis not present

## 2015-07-12 DIAGNOSIS — I10 Essential (primary) hypertension: Secondary | ICD-10-CM | POA: Diagnosis not present

## 2015-07-12 NOTE — Progress Notes (Signed)
Patient is discharge to home at 8:50 am accompanied by spouse  and staff via wheelchair. Discharge instructions given. Patient verbalizes understanding. All personal belongings given. Denies any pain. Vital signs taken and recorded. Patient's stable. No signs and symptoms of distress noted.

## 2015-07-18 ENCOUNTER — Ambulatory Visit: Payer: Medicare Other | Admitting: Interventional Cardiology

## 2015-07-22 ENCOUNTER — Encounter: Payer: Self-pay | Admitting: Internal Medicine

## 2015-07-22 ENCOUNTER — Ambulatory Visit (INDEPENDENT_AMBULATORY_CARE_PROVIDER_SITE_OTHER): Payer: Medicare Other | Admitting: *Deleted

## 2015-07-22 DIAGNOSIS — I5022 Chronic systolic (congestive) heart failure: Secondary | ICD-10-CM | POA: Diagnosis not present

## 2015-07-22 DIAGNOSIS — I447 Left bundle-branch block, unspecified: Secondary | ICD-10-CM

## 2015-07-22 LAB — CUP PACEART INCLINIC DEVICE CHECK
Battery Remaining Longevity: 52 mo
Battery Voltage: 3.01 V
Brady Statistic RA Percent Paced: 0.09 %
Brady Statistic RV Percent Paced: 14.85 %
HIGH POWER IMPEDANCE MEASURED VALUE: 51 Ohm
Implantable Lead Implant Date: 20161013
Implantable Lead Location: 753858
Implantable Lead Location: 753859
Implantable Lead Location: 753860
Implantable Lead Model: 4398
Lead Channel Impedance Value: 285 Ohm
Lead Channel Impedance Value: 399 Ohm
Lead Channel Impedance Value: 494 Ohm
Lead Channel Impedance Value: 513 Ohm
Lead Channel Impedance Value: 551 Ohm
Lead Channel Impedance Value: 608 Ohm
Lead Channel Pacing Threshold Pulse Width: 0.4 ms
Lead Channel Sensing Intrinsic Amplitude: 11.375 mV
Lead Channel Sensing Intrinsic Amplitude: 16.75 mV
Lead Channel Sensing Intrinsic Amplitude: 3.625 mV
Lead Channel Setting Pacing Amplitude: 3.75 V
Lead Channel Setting Pacing Pulse Width: 0.8 ms
Lead Channel Setting Sensing Sensitivity: 0.3 mV
MDC IDC LEAD IMPLANT DT: 20161013
MDC IDC LEAD IMPLANT DT: 20161013
MDC IDC MSMT LEADCHNL LV IMPEDANCE VALUE: 285 Ohm
MDC IDC MSMT LEADCHNL LV IMPEDANCE VALUE: 380 Ohm
MDC IDC MSMT LEADCHNL LV IMPEDANCE VALUE: 437 Ohm
MDC IDC MSMT LEADCHNL LV IMPEDANCE VALUE: 551 Ohm
MDC IDC MSMT LEADCHNL LV IMPEDANCE VALUE: 646 Ohm
MDC IDC MSMT LEADCHNL LV PACING THRESHOLD AMPLITUDE: 3.25 V
MDC IDC MSMT LEADCHNL LV PACING THRESHOLD PULSEWIDTH: 0.8 ms
MDC IDC MSMT LEADCHNL RA IMPEDANCE VALUE: 399 Ohm
MDC IDC MSMT LEADCHNL RA PACING THRESHOLD AMPLITUDE: 0.5 V
MDC IDC MSMT LEADCHNL RA SENSING INTR AMPL: 3.125 mV
MDC IDC MSMT LEADCHNL RV IMPEDANCE VALUE: 342 Ohm
MDC IDC MSMT LEADCHNL RV PACING THRESHOLD AMPLITUDE: 0.75 V
MDC IDC MSMT LEADCHNL RV PACING THRESHOLD PULSEWIDTH: 0.4 ms
MDC IDC SESS DTM: 20161024103401
MDC IDC SET LEADCHNL RA PACING AMPLITUDE: 3.5 V
MDC IDC STAT BRADY AP VP PERCENT: 0.08 %
MDC IDC STAT BRADY AP VS PERCENT: 0.01 %
MDC IDC STAT BRADY AS VP PERCENT: 98.5 %
MDC IDC STAT BRADY AS VS PERCENT: 1.41 %

## 2015-07-22 NOTE — Progress Notes (Signed)
Wound check appointment. Steri-strips removed. Wound without redness or edema. Incision edges approximated, wound well healed. Normal device function. Thresholds, sensing, and impedances consistent with implant measurements. Device programmed at 3.5V for extra safety margin until 3 month visit---changed LV output from 4.75 to 3.75V w/ 1.0V safety margin. Histogram distribution appropriate for patient and level of activity. No mode switches or ventricular arrhythmias noted. Patient educated about wound care, arm mobility, lifting restrictions, shock plan. ROV w/ GT 10/11/15.

## 2015-08-13 ENCOUNTER — Other Ambulatory Visit: Payer: Self-pay | Admitting: Interventional Cardiology

## 2015-08-14 ENCOUNTER — Other Ambulatory Visit: Payer: Self-pay | Admitting: Interventional Cardiology

## 2015-10-11 ENCOUNTER — Encounter: Payer: Self-pay | Admitting: Internal Medicine

## 2015-10-11 ENCOUNTER — Ambulatory Visit (INDEPENDENT_AMBULATORY_CARE_PROVIDER_SITE_OTHER): Payer: Medicare Other | Admitting: Internal Medicine

## 2015-10-11 VITALS — BP 132/82 | HR 82 | Ht 67.5 in | Wt 231.6 lb

## 2015-10-11 DIAGNOSIS — I447 Left bundle-branch block, unspecified: Secondary | ICD-10-CM

## 2015-10-11 DIAGNOSIS — Z95810 Presence of automatic (implantable) cardiac defibrillator: Secondary | ICD-10-CM | POA: Diagnosis not present

## 2015-10-11 DIAGNOSIS — I5043 Acute on chronic combined systolic (congestive) and diastolic (congestive) heart failure: Secondary | ICD-10-CM

## 2015-10-11 DIAGNOSIS — I110 Hypertensive heart disease with heart failure: Secondary | ICD-10-CM | POA: Diagnosis not present

## 2015-10-11 LAB — CUP PACEART INCLINIC DEVICE CHECK
Battery Voltage: 2.97 V
Brady Statistic AP VP Percent: 0.1 %
Brady Statistic AS VP Percent: 98.45 %
Brady Statistic RA Percent Paced: 0.12 %
HighPow Impedance: 62 Ohm
Implantable Lead Implant Date: 20161013
Implantable Lead Location: 753859
Implantable Lead Location: 753860
Lead Channel Impedance Value: 285 Ohm
Lead Channel Impedance Value: 437 Ohm
Lead Channel Impedance Value: 437 Ohm
Lead Channel Impedance Value: 456 Ohm
Lead Channel Impedance Value: 570 Ohm
Lead Channel Impedance Value: 646 Ohm
Lead Channel Impedance Value: 665 Ohm
Lead Channel Pacing Threshold Amplitude: 0.75 V
Lead Channel Pacing Threshold Pulse Width: 0.4 ms
Lead Channel Pacing Threshold Pulse Width: 0.8 ms
Lead Channel Sensing Intrinsic Amplitude: 3.25 mV
Lead Channel Setting Pacing Amplitude: 3.5 V
Lead Channel Setting Pacing Pulse Width: 0.8 ms
MDC IDC LEAD IMPLANT DT: 20161013
MDC IDC LEAD IMPLANT DT: 20161013
MDC IDC LEAD LOCATION: 753858
MDC IDC LEAD MODEL: 4398
MDC IDC MSMT BATTERY REMAINING LONGEVITY: 59 mo
MDC IDC MSMT LEADCHNL LV IMPEDANCE VALUE: 342 Ohm
MDC IDC MSMT LEADCHNL LV IMPEDANCE VALUE: 570 Ohm
MDC IDC MSMT LEADCHNL LV IMPEDANCE VALUE: 570 Ohm
MDC IDC MSMT LEADCHNL LV IMPEDANCE VALUE: 608 Ohm
MDC IDC MSMT LEADCHNL LV IMPEDANCE VALUE: 665 Ohm
MDC IDC MSMT LEADCHNL LV PACING THRESHOLD AMPLITUDE: 2.75 V
MDC IDC MSMT LEADCHNL RA PACING THRESHOLD AMPLITUDE: 0.75 V
MDC IDC MSMT LEADCHNL RA PACING THRESHOLD PULSEWIDTH: 0.4 ms
MDC IDC MSMT LEADCHNL RV IMPEDANCE VALUE: 437 Ohm
MDC IDC MSMT LEADCHNL RV SENSING INTR AMPL: 20.625 mV
MDC IDC SESS DTM: 20170113094506
MDC IDC SET LEADCHNL RA PACING AMPLITUDE: 1.5 V
MDC IDC SET LEADCHNL RV SENSING SENSITIVITY: 0.3 mV
MDC IDC STAT BRADY AP VS PERCENT: 0.01 %
MDC IDC STAT BRADY AS VS PERCENT: 1.43 %
MDC IDC STAT BRADY RV PERCENT PACED: 24.48 %

## 2015-10-11 NOTE — Assessment & Plan Note (Signed)
Her medtronic BiV ICD is working normally. Will recheck in several months. 

## 2015-10-11 NOTE — Assessment & Plan Note (Signed)
Her blood pressure is well controlled. Will follow.  

## 2015-10-11 NOTE — Progress Notes (Signed)
HPI Ms. Ciccarelli returns today to followup her BiV ICD, implanted several months ago. She is a pleasant 75 yo woman with a h/o chronic systolic heart failure, initially diagnosed 13 months ago. Catheterization demonstrated no obstructive CAD. She has LBBB with a QRS duration of 180 ms. She was treated with maximal medical therapy under the direction of Dr. Katrinka Blazing but her LV function did not improve at 25%. She underwent Biv ICD insertion 3 months ago. In the interim, she has done well. She continues to work and denies sob. She did note some dietary indiscretion over the holidays.   Allergies  Allergen Reactions  . Shellfish Allergy Itching     Current Outpatient Prescriptions  Medication Sig Dispense Refill  . aspirin EC 81 MG EC tablet Take 1 tablet (81 mg total) by mouth daily.    Marland Kitchen atorvastatin (LIPITOR) 20 MG tablet Take 20 mg by mouth daily.    . carvedilol (COREG) 12.5 MG tablet TAKE 1 TABLET BY MOUTH TWICE DAILY WITH A MEAL 180 tablet 0  . irbesartan-hydrochlorothiazide (AVALIDE) 300-12.5 MG per tablet Take 1 tablet by mouth daily. 90 tablet 3  . nitroGLYCERIN (NITROSTAT) 0.4 MG SL tablet Place 1 tablet (0.4 mg total) under the tongue every 5 (five) minutes as needed for chest pain. 25 tablet 4  . potassium chloride SA (K-DUR,KLOR-CON) 20 MEQ tablet Take 1 tablet (20 mEq total) by mouth daily. 30 tablet 11  . Vitamin D, Ergocalciferol, (DRISDOL) 50000 UNITS CAPS capsule Take 50,000 Units by mouth 2 (two) times a week.      No current facility-administered medications for this visit.     Past Medical History  Diagnosis Date  . Hypertension   . Tobacco abuse   . LBBB (left bundle branch block)     a. noted on 10/24/14 admission for dizziness   . Episode of dizziness, secondary to cardiomyopathy possibly  10/25/2014  . Dilated cardiomyopathy (HCC)   . Presence of permanent cardiac pacemaker   . CHF (congestive heart failure) (HCC)   . Chronic bronchitis (HCC)     "q other  year" (07/12/2015)  . Arthritis     "maybe in my hands/fingers; not terrible" (07/12/2015)    ROS:   All systems reviewed and negative except as noted in the HPI.   Past Surgical History  Procedure Laterality Date  . Left heart catheterization with coronary angiogram N/A 10/29/2014    Procedure: LEFT HEART CATHETERIZATION WITH CORONARY ANGIOGRAM;  Surgeon: Lesleigh Noe, MD;  Location: [facility] CATH LAB;  Service: Cardiovascular;  Laterality: N/A;  . Bi-ventricular implantable cardioverter defibrillator  (crt-d)  07/11/2015  . Tonsillectomy    . Appendectomy    . Cardiac catheterization    . Abdominal hysterectomy  1970's  . Tubal ligation  1970's  . Ep implantable device N/A 07/11/2015    Procedure: BiV ICD Insertion CRT-D;  Surgeon: Marinus Maw, MD;  Location: [facility] INVASIVE CV LAB;  Service: Cardiovascular;  Laterality: N/A;     Family History  Problem Relation Age of Onset  . Arrhythmia Mother   . Hypertension Mother   . Diabetes Mother   . Heart disease Mother   . Cancer Father   . Lung cancer Sister   . Healthy Paternal Aunt      Social History   Social History  . Marital Status: Married    Spouse Name: N/A  . Number of Children: N/A  . Years of Education: N/A  Occupational History  . Not on file.   Social History Main Topics  . Smoking status: Current Every Day Smoker -- 0.33 packs/day for 55 years    Types: Cigarettes  . Smokeless tobacco: Never Used  . Alcohol Use: No     Comment: on special occasions  . Drug Use: No  . Sexual Activity: Yes   Other Topics Concern  . Not on file   Social History Narrative     BP 132/82 mmHg  Pulse 82  Ht 5' 7.5" (1.715 m)  Wt 231 lb 9.6 oz (105.053 kg)  BMI 35.72 kg/m2  Physical Exam:  stable appearing 75 yo woman, NAD HEENT: Unremarkable Neck:  7 cm JVD, no thyromegally Back:  No CVA tenderness Lungs:  Clear with no wheezes HEART:  Regular rate rhythm, no murmurs, no rubs, no clicks Abd:  soft,  positive bowel sounds, no organomegally, no rebound, no guarding Ext:  2 plus pulses, no edema, no cyanosis, no clubbing Skin:  No rashes no nodules Neuro:  CN II through XII intact, motor grossly intact  EKG - nsr with BiV pacing  Assess/Plan:

## 2015-10-11 NOTE — Patient Instructions (Signed)
Medication Instructions:  Your physician recommends that you continue on your current medications as directed. Please refer to the Current Medication list given to you today.   Labwork: none  Testing/Procedures: none  Follow-Up: Remote monitoring is used to monitor your Pacemaker of ICD from home. This monitoring reduces the number of office visits required to check your device to one time per year. It allows [place] to keep an eye on the functioning of your device to ensure it is working properly. You are scheduled for a device check from home on 01/13/16. You may send your transmission at any time that day. If you have a wireless device, the transmission will be sent automatically. After your physician reviews your transmission, you will receive a postcard with your next transmission date.  Your physician wants you to follow-up in: 9 months with Dr. Ladona Ridgel. You will receive a reminder letter in the mail two months in advance. If you don't receive a letter, please call our office to schedule the follow-up appointment.    Any Other Special Instructions Will Be Listed Below (If Applicable).     If you need a refill on your cardiac medications before your next appointment, please call your pharmacy.

## 2015-10-11 NOTE — Assessment & Plan Note (Signed)
Her symptoms are well compensated, class 2. She will continue her current meds. I have encouraged her to increase her physical activity.

## 2015-10-15 ENCOUNTER — Emergency Department (HOSPITAL_COMMUNITY)
Admission: EM | Admit: 2015-10-15 | Discharge: 2015-10-15 | Disposition: A | Discharge status: Home / Self Care | Payer: Medicare Other | Attending: Emergency Medicine | Admitting: Emergency Medicine

## 2015-10-15 ENCOUNTER — Encounter (HOSPITAL_COMMUNITY): Payer: Self-pay | Admitting: Neurology

## 2015-10-15 DIAGNOSIS — I951 Orthostatic hypotension: Secondary | ICD-10-CM | POA: Diagnosis not present

## 2015-10-15 DIAGNOSIS — R55 Syncope and collapse: Secondary | ICD-10-CM

## 2015-10-15 DIAGNOSIS — R42 Dizziness and giddiness: Secondary | ICD-10-CM | POA: Insufficient documentation

## 2015-10-15 DIAGNOSIS — R531 Weakness: Secondary | ICD-10-CM | POA: Diagnosis not present

## 2015-10-15 DIAGNOSIS — I429 Cardiomyopathy, unspecified: Secondary | ICD-10-CM

## 2015-10-15 DIAGNOSIS — Z720 Tobacco use: Secondary | ICD-10-CM

## 2015-10-15 DIAGNOSIS — Z95810 Presence of automatic (implantable) cardiac defibrillator: Secondary | ICD-10-CM | POA: Insufficient documentation

## 2015-10-15 DIAGNOSIS — I428 Other cardiomyopathies: Secondary | ICD-10-CM

## 2015-10-15 DIAGNOSIS — I5022 Chronic systolic (congestive) heart failure: Secondary | ICD-10-CM | POA: Diagnosis not present

## 2015-10-15 DIAGNOSIS — Z7982 Long term (current) use of aspirin: Secondary | ICD-10-CM | POA: Diagnosis not present

## 2015-10-15 DIAGNOSIS — I110 Hypertensive heart disease with heart failure: Secondary | ICD-10-CM | POA: Insufficient documentation

## 2015-10-15 DIAGNOSIS — M1990 Unspecified osteoarthritis, unspecified site: Secondary | ICD-10-CM | POA: Diagnosis not present

## 2015-10-15 DIAGNOSIS — Z9889 Other specified postprocedural states: Secondary | ICD-10-CM | POA: Insufficient documentation

## 2015-10-15 DIAGNOSIS — F17210 Nicotine dependence, cigarettes, uncomplicated: Secondary | ICD-10-CM | POA: Insufficient documentation

## 2015-10-15 DIAGNOSIS — Z79899 Other long term (current) drug therapy: Secondary | ICD-10-CM | POA: Insufficient documentation

## 2015-10-15 DIAGNOSIS — R739 Hyperglycemia, unspecified: Secondary | ICD-10-CM

## 2015-10-15 DIAGNOSIS — I959 Hypotension, unspecified: Secondary | ICD-10-CM

## 2015-10-15 HISTORY — DX: Hypertensive heart disease without heart failure: I11.9

## 2015-10-15 HISTORY — DX: Other cardiomyopathies: I42.8

## 2015-10-15 HISTORY — DX: Essential (primary) hypertension: I10

## 2015-10-15 HISTORY — DX: Chronic systolic (congestive) heart failure: I50.22

## 2015-10-15 LAB — URINALYSIS, ROUTINE W REFLEX MICROSCOPIC
Bilirubin Urine: NEGATIVE
GLUCOSE, UA: NEGATIVE mg/dL
HGB URINE DIPSTICK: NEGATIVE
KETONES UR: NEGATIVE mg/dL
LEUKOCYTES UA: NEGATIVE
Nitrite: NEGATIVE
PROTEIN: NEGATIVE mg/dL
Specific Gravity, Urine: 1.022 (ref 1.005–1.030)
pH: 5 (ref 5.0–8.0)

## 2015-10-15 LAB — I-STAT TROPONIN, ED: Troponin i, poc: 0.01 ng/mL (ref 0.00–0.08)

## 2015-10-15 LAB — CBC
HCT: 37.6 % (ref 36.0–46.0)
Hemoglobin: 12.5 g/dL (ref 12.0–15.0)
MCH: 30.2 pg (ref 26.0–34.0)
MCHC: 33.2 g/dL (ref 30.0–36.0)
MCV: 90.8 fL (ref 78.0–100.0)
PLATELETS: 203 10*3/uL (ref 150–400)
RBC: 4.14 MIL/uL (ref 3.87–5.11)
RDW: 13.3 % (ref 11.5–15.5)
WBC: 7.1 10*3/uL (ref 4.0–10.5)

## 2015-10-15 LAB — BASIC METABOLIC PANEL
Anion gap: 10 (ref 5–15)
BUN: 22 mg/dL — AB (ref 6–20)
CALCIUM: 10.2 mg/dL (ref 8.9–10.3)
CO2: 26 mmol/L (ref 22–32)
CREATININE: 1.09 mg/dL — AB (ref 0.44–1.00)
Chloride: 104 mmol/L (ref 101–111)
GFR calc Af Amer: 57 mL/min — ABNORMAL LOW (ref >60)
GFR calc non Af Amer: 49 mL/min — ABNORMAL LOW (ref >60)
GLUCOSE: 152 mg/dL — AB (ref 65–99)
Potassium: 3.9 mmol/L (ref 3.5–5.1)
Sodium: 140 mmol/L (ref 135–145)

## 2015-10-15 LAB — CBG MONITORING, ED: GLUCOSE-CAPILLARY: 141 mg/dL — AB (ref 65–99)

## 2015-10-15 MED ORDER — SODIUM CHLORIDE 0.9 % IV BOLUS (SEPSIS)
500.0000 mL | Freq: Once | INTRAVENOUS | Status: AC
  Administered 2015-10-15: 500 mL via INTRAVENOUS

## 2015-10-15 NOTE — ED Notes (Signed)
Pt ambulate to bathroom without assistance. Pt denies dizziness.

## 2015-10-15 NOTE — ED Notes (Signed)
CBG - 141 ° °

## 2015-10-15 NOTE — Consult Note (Signed)
Cardiology Consultation Note    Patient ID: Kippy Melena, MRN: [id number], DOB/AGE: 1940-11-14 75 y.o. Admit date: 10/15/2015   Date of Consult: 10/15/2015 Primary Physician: Alva Garnet., MD Primary Cardiologist: Dr. Katrinka Blazing  Primary Electrophysiologist: Dr. Ladona Ridgel  Chief Complaint: "felt funny" Reason for Consultation: near-syncope Requesting MD: Dr. Clayborne Dana  HPI: Pt is a 75 y.o. AA female with hx of essential HTN, LBBB, chronic systolic CHF, NICM s/p medtronic BiV-ICD 06/2015 (cath 10/2014 normal coronary arteries, tortuous c/w HTN), and tobacco abuse with 27.5 pack year hx who presented to the ER today for near syncopal episode.   Pt states that while being interviewed on live TV news this morning, she felt "a little funny" and had to sit down. Is unsure if she lost consciousness but claims that the people around her saw her "eyes roll into the back of her head." However, this was momentarily and once she tried to stand back up, she felt the same feeling again. EMS was notified. BP was reportedly 90 palpated sitting, 40 palpated standing. Initial BP in ER was 112/76. She has since received 500 cc saline bolus with subsequent resolution of symptoms and increase in blood pressure. States that she believes this is due to her not drinking enough water lately. 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 Denies CP, SOB, dizziness, diaphoresis, nausea, palpitations, edema, and orthopnea. Device was interrogated in the ER with no evidence of arrhthymias. Pt was seen by Dr. Ladona Ridgel on 10/11/2015 and was felt to be doing well at that time.     Past Medical History  Diagnosis Date  . Essential hypertension   . Tobacco abuse   . LBBB (left bundle branch block)     a. noted on 10/24/14 admission for dizziness   . Episode of dizziness, secondary to cardiomyopathy possibly  10/25/2014  . Dilated cardiomyopathy (HCC)   . Chronic  systolic CHF (congestive heart failure) (HCC)   . Arthritis     "maybe in my hands/fingers; not terrible" (07/12/2015)  . NICM (nonischemic cardiomyopathy) (HCC)     a. Cath 10/29/14: normal coronary arteries (tortuous, compatible with hypertension). b. s/p Medtronic BiV-ICD 06/2015.  Marland Kitchen Hypertensive heart disease       Surgical History:  Past Surgical History  Procedure Laterality Date  . Left heart catheterization with coronary angiogram N/A 10/29/2014    Procedure: LEFT HEART CATHETERIZATION WITH CORONARY ANGIOGRAM;  Surgeon: Lesleigh Noe, MD;  Location: [facility] CATH LAB;  Service: Cardiovascular;  Laterality: N/A;  . Bi-ventricular implantable cardioverter defibrillator  (crt-d)  07/11/2015  . Tonsillectomy    . Appendectomy    . Cardiac catheterization    . Abdominal hysterectomy  1970's  . Tubal ligation  1970's  . Ep implantable device N/A 07/11/2015    Procedure: BiV ICD Insertion CRT-D;  Surgeon: Marinus Maw, MD;  Location: [facility] INVASIVE CV LAB;  Service: Cardiovascular;  Laterality: N/A;     Home Meds: Prior to Admission medications   Medication Sig Start Date End Date Taking? Authorizing Provider  aspirin EC 81 MG EC tablet Take 1 tablet (81 mg total) by mouth daily. 10/26/14  Yes Leone Brand, NP  atorvastatin (LIPITOR) 20 MG tablet Take 20 mg by mouth daily.   Yes Historical Provider, MD  carvedilol (COREG) 12.5 MG tablet TAKE 1 TABLET BY MOUTH TWICE DAILY WITH A MEAL 08/14/15  Yes Lyn Records, MD  irbesartan-hydrochlorothiazide (AVALIDE) 300-12.5 MG per tablet Take 1 tablet by mouth daily. 03/01/15  Yes Lyn Records, MD  potassium chloride SA (K-DUR,KLOR-CON) 20 MEQ tablet Take 1 tablet (20 mEq total) by mouth daily. 06/13/15  Yes Lyn Records, MD  Vitamin D, Ergocalciferol, (DRISDOL) 50000 UNITS CAPS capsule Take 50,000 Units by mouth 2 (two) times a week.    Yes Historical Provider, MD  nitroGLYCERIN (NITROSTAT) 0.4 MG SL tablet Place 1 tablet (0.4 mg total) under the  tongue every 5 (five) minutes as needed for chest pain. 10/26/14   Leone Brand, NP    Inpatient Medications:     Allergies:  Allergies  Allergen Reactions  . Shellfish Allergy Itching    Social History   Social History  . Marital Status: Married    Spouse Name: N/A  . Number of Children: N/A  . Years of Education: N/A   Occupational History  . Not on file.   Social History Main Topics  . Smoking status: Current Every Day Smoker -- 0.33 packs/day for 55 years    Types: Cigarettes  . Smokeless tobacco: Never Used  . Alcohol Use: No     Comment: on special occasions  . Drug Use: No  . Sexual Activity: Yes   Other Topics Concern  . Not on file   Social History Narrative     Family History  Problem Relation Age of Onset  . Arrhythmia Mother   . Hypertension Mother   . Diabetes Mother   . Heart disease Mother   . Cancer Father   . Lung cancer Sister   . Healthy Paternal Aunt      Review of Systems: All other systems reviewed and are otherwise negative except as noted above.  Labs: Troponin neg x1  Lab Results  Component Value Date   WBC 7.1 10/15/2015   HGB 12.5 10/15/2015   HCT 37.6 10/15/2015   MCV 90.8 10/15/2015   PLT 203 10/15/2015    Recent Labs Lab 10/15/15 0906  NA 140  K 3.9  CL 104  CO2 26  BUN 22*  CREATININE 1.09*  CALCIUM 10.2  GLUCOSE 152*   Radiology/Studies:  No results found.  Wt Readings from Last 3 Encounters:  10/11/15 231 lb 9.6 oz (105.053 kg)  07/12/15 234 lb 2.1 oz (106.2 kg)  06/27/15 235 lb (106.595 kg)   EKG: NSR with V pacing 62bpm, underlying LBBB  Physical Exam: Blood pressure 131/66, pulse 62, temperature 97.6 F (36.4 C), temperature source Oral, resp. rate 17, SpO2 97 %. There is no weight on file to calculate BMI. General: Well developed, well nourished AAF, in no acute distress. Head: Normocephalic, atraumatic, sclera non-icteric, no xanthomas, nares are without discharge.  Neck: Negative for  carotid bruits. JVD not elevated. Lungs: Clear bilaterally to auscultation without wheezes, rales, or rhonchi (mildly diminished BS). Breathing is unlabored. Heart: RRR with S1 S2. No murmurs, rubs, or gallops appreciated. Abdomen: Soft, non-tender, non-distended with normoactive bowel sounds. No hepatomegaly. No rebound/guarding. No obvious abdominal masses. Msk:  Strength and tone appear normal for age. Extremities: No clubbing or cyanosis. No edema.  Distal pedal pulses are 2+ and equal bilaterally. Neuro: Alert and oriented X 3. No facial asymmetry. No focal deficit. Moves all extremities spontaneously. Psych:  Responds to questions appropriately with a normal affect.    Assessment and Plan   1. Near-syncope suspected due to hypotension in the setting of dehydration - patient reports not keeping up with oral intake  of hydration recently. Suspect this contributed. No events noted on device interrogation. BP reportedly low in the field. This has improved with hydration and she is now asymptomatic. Would recommend to continue current meds, ensure adequate reasonable hydration, and follow for further symptoms.  2. NICM/chronic systolic CHF s/p BiV-ICD - appears euvolemic. No recent CHF symptoms. Device interrogation OK.   3. Essential HTN - would recommend she check BP once per day and contact primary cardiologist if she notices any abnormal readings. Rx given for BP cuff.  4. Tobacco abuse - counseled regarding cessation.  5. Hyperglycemia - CBG slightly elevated in ER, not sure if related to acute event. Advised she follow with primary doctor for this.  I have sent a message to our [facility] office's scheduler requesting a follow-up appointment, and our office will call the patient with this information.  Signed, Laurann Montana PA-C 10/15/2015, 11:23 AM Pager: [phone]

## 2015-10-15 NOTE — ED Notes (Signed)
Pt ambulate without assistance to the bathroom. Pt denies dizziness.

## 2015-10-15 NOTE — ED Notes (Signed)
Cardiology at bedside.

## 2015-10-15 NOTE — ED Provider Notes (Signed)
CSN: [id number]     Arrival date & time 10/15/15  0849 History   First MD Initiated Contact with Patient 10/15/15 [phone]     Chief Complaint  Patient presents with  . Near Syncope     (Consider location/radiation/quality/duration/timing/severity/associated sxs/prior Treatment) Patient is a 75 y.o. female presenting with near-syncope.  Near Syncope This is a new problem. The current episode started 1 to 2 hours ago. The problem occurs rarely. The problem has not changed since onset.Pertinent negatives include no chest pain, no headaches and no shortness of breath. Nothing aggravates the symptoms. Nothing relieves the symptoms. She has tried nothing for the symptoms. The treatment provided no relief.    Past Medical History  Diagnosis Date  . Essential hypertension   . Tobacco abuse   . LBBB (left bundle branch block)     a. noted on 10/24/14 admission for dizziness   . Episode of dizziness, secondary to cardiomyopathy possibly  10/25/2014  . Dilated cardiomyopathy (HCC)   . Chronic systolic CHF (congestive heart failure) (HCC)   . Arthritis     "maybe in my hands/fingers; not terrible" (07/12/2015)  . NICM (nonischemic cardiomyopathy) (HCC)     a. Cath 10/29/14: normal coronary arteries (tortuous, compatible with hypertension). b. s/p Medtronic BiV-ICD 06/2015.  Marland Kitchen Hypertensive heart disease    Past Surgical History  Procedure Laterality Date  . Left heart catheterization with coronary angiogram N/A 10/29/2014    Procedure: LEFT HEART CATHETERIZATION WITH CORONARY ANGIOGRAM;  Surgeon: Lesleigh Noe, MD;  Location: [facility] CATH LAB;  Service: Cardiovascular;  Laterality: N/A;  . Bi-ventricular implantable cardioverter defibrillator  (crt-d)  07/11/2015  . Tonsillectomy    . Appendectomy    . Cardiac catheterization    . Abdominal hysterectomy  1970's  . Tubal ligation  1970's  . Ep implantable device N/A 07/11/2015    Procedure: BiV ICD Insertion CRT-D;  Surgeon: Marinus Maw, MD;   Location: [facility] INVASIVE CV LAB;  Service: Cardiovascular;  Laterality: N/A;   Family History  Problem Relation Age of Onset  . Arrhythmia Mother   . Hypertension Mother   . Diabetes Mother   . Heart disease Mother   . Cancer Father   . Lung cancer Sister   . Healthy Paternal Aunt    Social History  Substance Use Topics  . Smoking status: Current Every Day Smoker -- 0.33 packs/day for 55 years    Types: Cigarettes  . Smokeless tobacco: Never Used  . Alcohol Use: No     Comment: on special occasions   OB History    No data available     Review of Systems  Constitutional: Negative for chills and fatigue.  HENT: Negative for congestion.   Eyes: Negative for photophobia and pain.  Respiratory: Negative for cough and shortness of breath.   Cardiovascular: Positive for near-syncope. Negative for chest pain.  Gastrointestinal: Negative for nausea and vomiting.  Genitourinary: Negative for urgency and pelvic pain.  Musculoskeletal: Negative for back pain.  Neurological: Positive for weakness and light-headedness. Negative for seizures, facial asymmetry and headaches.  All other systems reviewed and are negative.     Allergies  Shellfish allergy  Home Medications   Prior to Admission medications   Medication Sig Start Date End Date Taking? Authorizing Provider  aspirin EC 81 MG EC tablet Take 1 tablet (81 mg total) by mouth daily. 10/26/14  Yes Leone Brand, NP  atorvastatin (LIPITOR) 20 MG tablet Take 20 mg by mouth daily.  Yes Historical Provider, MD  carvedilol (COREG) 12.5 MG tablet TAKE 1 TABLET BY MOUTH TWICE DAILY WITH A MEAL 08/14/15  Yes Lyn Records, MD  irbesartan-hydrochlorothiazide (AVALIDE) 300-12.5 MG per tablet Take 1 tablet by mouth daily. 03/01/15  Yes Lyn Records, MD  potassium chloride SA (K-DUR,KLOR-CON) 20 MEQ tablet Take 1 tablet (20 mEq total) by mouth daily. 06/13/15  Yes Lyn Records, MD  Vitamin D, Ergocalciferol, (DRISDOL) 50000 UNITS CAPS capsule  Take 50,000 Units by mouth 2 (two) times a week.    Yes Historical Provider, MD  nitroGLYCERIN (NITROSTAT) 0.4 MG SL tablet Place 1 tablet (0.4 mg total) under the tongue every 5 (five) minutes as needed for chest pain. 10/26/14   Leone Brand, NP   BP 162/88 mmHg  Pulse 58  Temp(Src) 97.6 F (36.4 C) (Oral)  Resp 13  SpO2 99% Physical Exam  Constitutional: She is oriented to person, place, and time. She appears well-developed and well-nourished.  HENT:  Head: Normocephalic and atraumatic.  Neck: Normal range of motion.  Cardiovascular: Normal rate and regular rhythm.   Pulmonary/Chest: Effort normal. No stridor. No respiratory distress. She has no wheezes.  Abdominal: Soft. Bowel sounds are normal. She exhibits no distension. There is no tenderness.  Musculoskeletal: Normal range of motion. She exhibits no edema or tenderness.  No altered mental status, able to give full seemingly accurate history.  Face is symmetric, EOM's intact, pupils equal and reactive, vision intact, tongue and uvula midline without deviation Upper and Lower extremity motor 5/5, intact pain perception in distal extremities, 2+ reflexes in biceps, patella and achilles tendons. Finger to nose normal, heel to shin normal. Walks without assistance or evident ataxia.   Neurological: She is alert and oriented to person, place, and time. No cranial nerve deficit. Coordination normal.  Skin: Skin is warm and dry. No erythema.  Nursing note and vitals reviewed.   ED Course  Procedures (including critical care time) Labs Review Labs Reviewed  BASIC METABOLIC PANEL - Abnormal; Notable for the following:    Glucose, Bld 152 (*)    BUN 22 (*)    Creatinine, Ser 1.09 (*)    GFR calc non Af Amer 49 (*)    GFR calc Af Amer 57 (*)    All other components within normal limits  URINALYSIS, ROUTINE W REFLEX MICROSCOPIC (NOT AT [facility]) - Abnormal; Notable for the following:    Color, Urine AMBER (*)    All other components  within normal limits  CBG MONITORING, ED - Abnormal; Notable for the following:    Glucose-Capillary 141 (*)    All other components within normal limits  CBC  I-STAT TROPOININ, ED  POCT CBG (FASTING - GLUCOSE)-MANUAL ENTRY    Imaging Review No results found. I have personally reviewed and evaluated these images and lab results as part of my medical decision-making.   EKG Interpretation   Date/Time:  Tuesday October 15 2015 08:51:25 EST Ventricular Rate:  62 PR Interval:  183 QRS Duration: 148 QT Interval:  447 QTC Calculation: 454 R Axis:   20 Text Interpretation:  paced rhythm Left bundle branch block Baseline  wander in lead(s) I No significant change since last tracing Confirmed by  [facility] MD, Barbara Cower [phone]) on 10/15/2015 10:15:07 AM      MDM   Final diagnoses:  Near syncope    75 yo F here with near syncopal episode and orthostatic hypotension at the same time. Recently increased her Lasix. Has an  IVCD in place which was interrogated and negative for any events. Patient asymptomatic after 750 cc IVF and some PO fluids as well. Cardiology saw and doesn't see any need for admission. Neuro exam normal, doubt intracranial causes. No abdominal pain or back pain or headache to suggest other sinister causes. Asymptomatic fro >2 hours, ambulating normally, BP's normal, orthostatics normal, stable for dc with pcp follow up.     Marily Memos, MD 10/16/15 [phone]

## 2015-10-15 NOTE — ED Notes (Signed)
Patient ambulated with no dizziness or feeling lightheaded.  Patient pulse 78 and 100%.

## 2015-10-15 NOTE — ED Notes (Signed)
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 She then tried to stand again and had near syncopal episode again. BP 90 palpated sitting, 40 palpated standing. Has demand pacemaker. BP 112/76, is a x 4. Denies pain, CBG 142.

## 2015-10-24 NOTE — Progress Notes (Signed)
Cardiology Office Note   Date:  10/25/2015   ID:  Nicole Chan, DOB Apr 01, 1941, MRN [id number]  PCP:  Alva Garnet., MD  Primary Cardiologist: Dr. Katrinka Blazing  Primary Electrophysiologist: Dr. Heide Scales ER follow up- pre syncope   History of Present Illness: Nicole Chan is a 75 y.o. female with hx of essential HTN, LBBB, chronic systolic CHF, NICM s/p medtronic BiV-ICD 06/2015 (cath 10/2014 normal coronary arteries, tortuous c/w HTN), and tobacco abuse with 27.5 pack year hx who presents to clinic for post ER follow up.  She presented to the ER on 10/15/15 for a near syncopal episode. 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 Is unsure if she lost consciousness but claims that the people around her saw her "eyes roll into the back of her head." However, this was momentarily and once she tried to stand back up, she felt the same feeling again. EMS was notified. BP was reportedly 90 palpated sitting, 40 palpated standing. Initial BP in ER was 112/76. She received 500 cc saline bolus with subsequent resolution of symptoms and increase in blood pressure. She felt this was due to her not drinking enough water lately. 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 Denies CP, SOB, dizziness, diaphoresis, nausea, palpitations, edema, and orthopnea. Device was interrogated in the ER with no evidence of arrhthymias. Pt was seen by Dr. Ladona Ridgel on 10/11/2015 and was felt to be doing well at that time. She improved with hydration and was asymptomatic. She was sent home with close hospital follow up.   Today she presents to clinic for follow up. She has been fine since going home from the ER. She has been drinking lots of fluid and staying hydrated. No further episodes of pre syncope. No Chest pain or SOB. No dizziness or syncope. No le edema, orthopnea or PND.  She is going to [place] in the middle of February and needs to get some refills on medications. She will call the insurance company to see if they will pay.    Past Medical History  Diagnosis Date  . Essential hypertension   . Tobacco abuse   . LBBB (left bundle branch block)     a. noted on 10/24/14 admission for dizziness   . Episode of dizziness, secondary to cardiomyopathy possibly  10/25/2014  . Dilated cardiomyopathy (HCC)   . Chronic systolic CHF (congestive heart failure) (HCC)   . Arthritis     "maybe in my hands/fingers; not terrible" (07/12/2015)  . NICM (nonischemic cardiomyopathy) (HCC)     a. Cath 10/29/14: normal coronary arteries (tortuous, compatible with hypertension). b. s/p Medtronic BiV-ICD 06/2015.  Marland Kitchen Hypertensive heart disease     Past Surgical History  Procedure Laterality Date  . Left heart catheterization with coronary angiogram N/A 10/29/2014    Procedure: LEFT HEART CATHETERIZATION WITH CORONARY ANGIOGRAM;  Surgeon: Lesleigh Noe, MD;  Location: [facility] CATH LAB;  Service: Cardiovascular;  Laterality: N/A;  . Bi-ventricular implantable cardioverter defibrillator  (crt-d)  07/11/2015  . Tonsillectomy    . Appendectomy    . Cardiac catheterization    . Abdominal hysterectomy  1970's  . Tubal ligation  1970's  . Ep implantable device N/A 07/11/2015    Procedure: BiV ICD Insertion CRT-D;  Surgeon: Marinus Maw, MD;  Location: [facility] INVASIVE CV LAB;  Service:  Cardiovascular;  Laterality: N/A;     Current Outpatient Prescriptions  Medication Sig Dispense Refill  . aspirin EC 81 MG EC tablet Take 1 tablet (81 mg total) by mouth daily.    . carvedilol (COREG) 12.5 MG tablet TAKE 1 TABLET BY MOUTH TWICE DAILY WITH A MEAL 180 tablet 0  . irbesartan-hydrochlorothiazide (AVALIDE) 300-12.5 MG per tablet Take 1 tablet by mouth daily. 90 tablet 3  . nitroGLYCERIN (NITROSTAT) 0.4 MG SL tablet Place 1 tablet (0.4 mg total) under the tongue every 5 (five) minutes as needed for  chest pain. 25 tablet 4  . potassium chloride SA (K-DUR,KLOR-CON) 20 MEQ tablet Take 1 tablet (20 mEq total) by mouth daily. 30 tablet 11  . Vitamin D, Ergocalciferol, (DRISDOL) 50000 UNITS CAPS capsule Take 50,000 Units by mouth 2 (two) times a week.      No current facility-administered medications for this visit.    Allergies:   Shellfish allergy    Social History:  The patient  reports that she has been smoking Cigarettes.  She has a 18.15 pack-year smoking history. She has never used smokeless tobacco. She reports that she does not drink alcohol or use illicit drugs.   Family History:  The patient's family history includes Arrhythmia in her mother; Cancer in her father; Diabetes in her mother; Healthy in her paternal aunt; Heart disease in her mother; Hypertension in her mother; Lung cancer in her sister. There is no history of Heart attack or Stroke.    ROS:  Please see the history of present illness.   Otherwise, review of systems are positive for none.   All other systems are reviewed and negative.    PHYSICAL EXAM: VS:  BP 140/80 mmHg  Pulse 84  Ht 5' 7.5" (1.715 m)  Wt 233 lb (105.688 kg)  BMI 35.93 kg/m2 , BMI Body mass index is 35.93 kg/(m^2). GEN: Well nourished, well developed, in no acute distress HEENT: normal Neck: no JVD, carotid bruits, or masses Cardiac: RRR; no murmurs, rubs, or gallops,no edema  Respiratory:  clear to auscultation bilaterally, normal work of breathing GI: soft, nontender, nondistended, + BS MS: no deformity or atrophy Skin: warm and dry, no rash Neuro:  Strength and sensation are intact Psych: euthymic mood, full affect   EKG:  EKG is not ordered today.    Recent Labs: 10/15/2015: BUN 22*; Creatinine, Ser 1.09*; Hemoglobin 12.5; Platelets 203; Potassium 3.9; Sodium 140    Lipid Panel No results found for: CHOL, TRIG, HDL, CHOLHDL, VLDL, LDLCALC, LDLDIRECT    Wt Readings from Last 3 Encounters:  10/25/15 233 lb (105.688 kg)    10/11/15 231 lb 9.6 oz (105.053 kg)  07/12/15 234 lb 2.1 oz (106.2 kg)      Other studies Reviewed: Additional studies/ records that were reviewed today include: 2D ECHO Review of the above records demonstrates:   2D ECHO: 06/20/2015 LV EF: 25% -  30% Study Conclusions - Left ventricle: The cavity size was normal. Wall thickness was increased in a pattern of moderate LVH. Systolic function was severely reduced. The estimated ejection fraction was in the range of 25% to 30%. Septal-lateral dyssynchrony with severe septal hypokinesis. Relative preservation of lateral wall function. Doppler parameters are consistent with abnormal left ventricular relaxation (grade 1 diastolic dysfunction). Abnormal strain pattern, especially in lateral wall. GLS -11%. - Aortic valve: Trileaflet; moderately calcified leaflets. There was no stenosis. There was mild regurgitation. - Mitral valve: Moderately calcified annulus. Mildly calcified leaflets . There was no  significant regurgitation. - Left atrium: The atrium was at the upper limits of normal in size. - Right ventricle: The cavity size was normal. Systolic function was normal. - Tricuspid valve: Peak RV-RA gradient (S): 23 mm Hg. - Pulmonary arteries: PA peak pressure: 26 mm Hg (S). - Inferior vena cava: The vessel was normal in size. The respirophasic diameter changes were in the normal range (= 50%), consistent with normal central venous pressure. Impressions: - Normal LV size with moderate LV hypertrophy. EF 25-30% with septal-lateral dyssynchrony and severe septal hypokinesis (relatively preserved lateral wall function). Normal RV size and systolic function. Mild AI.    ASSESSMENT AND PLAN:  Nicole Chan is a 75 y.o. female with hx of essential HTN, LBBB, chronic systolic CHF, NICM s/p medtronic BiV-ICD 06/2015 (cath 10/2014 normal coronary arteries, tortuous c/w HTN), and tobacco abuse with 27.5  pack year hx who presents to clinic for post ER follow up.   1. Near-syncope suspected due to hypotension in the setting of dehydration - patient reports not keeping up with oral intake of hydration recently. Suspect this contributed. No events noted on device interrogation. BP reportedly low in the field. Now completely resolved with adequate fluid intake  2. NICM/chronic systolic CHF s/p BiV-ICD - appears euvolemic. No recent CHF symptoms. Continue to follow up with EP as previously scheduled  3. Essential HTN - BP 140/80. Continue current Rx- needed refills   4. Tobacco abuse - counseled regarding cessation.  5. Hyperglycemia - CBG slightly elevated in ER, not sure if related to acute event. Advised she follow with primary doctor for this.    Current medicines are reviewed at length with the patient today.  The patient does not have concerns regarding medicines.  The following changes have been made:  no change  Labs/ tests ordered today include:  No orders of the defined types were placed in this encounter.     Disposition:   FU with Dr. Katrinka Blazing and Dr. Ladona Ridgel as previously scheduled.   Charlestine Massed  10/25/2015 10:25 AM    [facility] [address] [place], [place], [place]  [address] Phone: [phone]; Fax: [phone]

## 2015-10-25 ENCOUNTER — Encounter: Payer: Self-pay | Admitting: Physician Assistant

## 2015-10-25 ENCOUNTER — Ambulatory Visit (INDEPENDENT_AMBULATORY_CARE_PROVIDER_SITE_OTHER): Payer: Medicare Other | Admitting: Physician Assistant

## 2015-10-25 VITALS — BP 140/80 | HR 84 | Ht 67.5 in | Wt 233.0 lb

## 2015-10-25 DIAGNOSIS — I10 Essential (primary) hypertension: Secondary | ICD-10-CM

## 2015-10-25 MED ORDER — CARVEDILOL 12.5 MG PO TABS: ORAL_TABLET | ORAL | Status: DC

## 2015-10-25 MED ORDER — IRBESARTAN-HYDROCHLOROTHIAZIDE 300-12.5 MG PO TABS: 1.0000 | ORAL_TABLET | Freq: Every day | ORAL | Status: DC

## 2015-10-25 NOTE — Patient Instructions (Signed)
Medication Instructions:  Your physician recommends that you continue on your current medications as directed. Please refer to the Current Medication list given to you today.   Labwork: None ordered  Testing/Procedures: None ordered  Follow-Up: Your physician wants you to follow-up in:  5 months with DR. Katrinka Blazing.  You will receive a reminder letter in the mail two months in advance. If you don't receive a letter, please call our office to schedule the follow-up appointment.    Any Other Special Instructions Will Be Listed Below (If Applicable).     If you need a refill on your cardiac medications before your next appointment, please call your pharmacy.

## 2016-01-13 ENCOUNTER — Ambulatory Visit (INDEPENDENT_AMBULATORY_CARE_PROVIDER_SITE_OTHER): Payer: Medicare Other | Admitting: *Deleted

## 2016-01-13 ENCOUNTER — Telehealth: Payer: Self-pay | Admitting: Cardiology

## 2016-01-13 DIAGNOSIS — Z95810 Presence of automatic (implantable) cardiac defibrillator: Secondary | ICD-10-CM | POA: Diagnosis not present

## 2016-01-13 DIAGNOSIS — I429 Cardiomyopathy, unspecified: Secondary | ICD-10-CM | POA: Diagnosis not present

## 2016-01-13 DIAGNOSIS — I428 Other cardiomyopathies: Secondary | ICD-10-CM

## 2016-01-13 NOTE — Telephone Encounter (Signed)
Spoke with pt and reminded pt of remote transmission that is due today. Pt verbalized understanding.   

## 2016-01-13 NOTE — Progress Notes (Signed)
Remote ICD transmission.   

## 2016-02-19 ENCOUNTER — Encounter: Payer: Self-pay | Admitting: Cardiology

## 2016-02-19 LAB — CUP PACEART REMOTE DEVICE CHECK
Battery Voltage: 2.94 V
Brady Statistic AP VS Percent: 0.01 %
Brady Statistic AS VP Percent: 98.45 %
Brady Statistic RA Percent Paced: 0.13 %
Brady Statistic RV Percent Paced: 27.37 %
HighPow Impedance: 72 Ohm
Implantable Lead Implant Date: 20161013
Implantable Lead Implant Date: 20161013
Implantable Lead Implant Date: 20161013
Implantable Lead Location: 753859
Lead Channel Impedance Value: 285 Ohm
Lead Channel Impedance Value: 342 Ohm
Lead Channel Impedance Value: 380 Ohm
Lead Channel Impedance Value: 399 Ohm
Lead Channel Impedance Value: 437 Ohm
Lead Channel Impedance Value: 608 Ohm
Lead Channel Impedance Value: 646 Ohm
Lead Channel Pacing Threshold Amplitude: 0.625 V
Lead Channel Pacing Threshold Pulse Width: 0.4 ms
Lead Channel Pacing Threshold Pulse Width: 0.8 ms
Lead Channel Sensing Intrinsic Amplitude: 15.25 mV
Lead Channel Sensing Intrinsic Amplitude: 15.25 mV
Lead Channel Sensing Intrinsic Amplitude: 3.125 mV
Lead Channel Setting Pacing Amplitude: 1.5 V
Lead Channel Setting Pacing Amplitude: 5.5 V
Lead Channel Setting Pacing Pulse Width: 0.8 ms
MDC IDC LEAD LOCATION: 753858
MDC IDC LEAD LOCATION: 753860
MDC IDC LEAD MODEL: 4398
MDC IDC MSMT BATTERY REMAINING LONGEVITY: 27 mo
MDC IDC MSMT LEADCHNL LV IMPEDANCE VALUE: 456 Ohm
MDC IDC MSMT LEADCHNL LV IMPEDANCE VALUE: 513 Ohm
MDC IDC MSMT LEADCHNL LV IMPEDANCE VALUE: 608 Ohm
MDC IDC MSMT LEADCHNL LV IMPEDANCE VALUE: 646 Ohm
MDC IDC MSMT LEADCHNL LV IMPEDANCE VALUE: 703 Ohm
MDC IDC MSMT LEADCHNL LV PACING THRESHOLD AMPLITUDE: 4.25 V
MDC IDC MSMT LEADCHNL RA PACING THRESHOLD AMPLITUDE: 0.625 V
MDC IDC MSMT LEADCHNL RA PACING THRESHOLD PULSEWIDTH: 0.4 ms
MDC IDC MSMT LEADCHNL RA SENSING INTR AMPL: 3.125 mV
MDC IDC MSMT LEADCHNL RV IMPEDANCE VALUE: 494 Ohm
MDC IDC SESS DTM: 20170417180247
MDC IDC SET LEADCHNL RV SENSING SENSITIVITY: 0.3 mV
MDC IDC STAT BRADY AP VP PERCENT: 0.12 %
MDC IDC STAT BRADY AS VS PERCENT: 1.42 %

## 2016-04-13 ENCOUNTER — Ambulatory Visit (INDEPENDENT_AMBULATORY_CARE_PROVIDER_SITE_OTHER): Payer: Medicare Other | Admitting: *Deleted

## 2016-04-13 DIAGNOSIS — I428 Other cardiomyopathies: Secondary | ICD-10-CM

## 2016-04-13 DIAGNOSIS — I429 Cardiomyopathy, unspecified: Secondary | ICD-10-CM | POA: Diagnosis not present

## 2016-04-13 DIAGNOSIS — Z95810 Presence of automatic (implantable) cardiac defibrillator: Secondary | ICD-10-CM | POA: Diagnosis not present

## 2016-04-13 NOTE — Progress Notes (Signed)
Remote ICD transmission.   

## 2016-04-15 ENCOUNTER — Encounter: Payer: Self-pay | Admitting: Cardiology

## 2016-04-15 LAB — CUP PACEART REMOTE DEVICE CHECK
Battery Voltage: 2.93 V
Brady Statistic AP VP Percent: 0.06 %
Brady Statistic AP VS Percent: 0.01 %
Brady Statistic AS VP Percent: 98.51 %
Date Time Interrogation Session: 20170717052202
HighPow Impedance: 73 Ohm
Implantable Lead Implant Date: 20161013
Implantable Lead Implant Date: 20161013
Implantable Lead Location: 753859
Implantable Lead Model: 4398
Implantable Lead Model: 5076
Lead Channel Impedance Value: 285 Ohm
Lead Channel Impedance Value: 399 Ohm
Lead Channel Impedance Value: 437 Ohm
Lead Channel Impedance Value: 456 Ohm
Lead Channel Impedance Value: 456 Ohm
Lead Channel Impedance Value: 646 Ohm
Lead Channel Impedance Value: 665 Ohm
Lead Channel Impedance Value: 665 Ohm
Lead Channel Pacing Threshold Amplitude: 0.625 V
Lead Channel Pacing Threshold Amplitude: 0.75 V
Lead Channel Pacing Threshold Pulse Width: 0.4 ms
Lead Channel Pacing Threshold Pulse Width: 0.8 ms
Lead Channel Sensing Intrinsic Amplitude: 3.25 mV
Lead Channel Sensing Intrinsic Amplitude: 3.25 mV
Lead Channel Setting Pacing Amplitude: 1.5 V
MDC IDC LEAD IMPLANT DT: 20161013
MDC IDC LEAD LOCATION: 753858
MDC IDC LEAD LOCATION: 753860
MDC IDC MSMT BATTERY REMAINING LONGEVITY: 25 mo
MDC IDC MSMT LEADCHNL LV IMPEDANCE VALUE: 380 Ohm
MDC IDC MSMT LEADCHNL LV IMPEDANCE VALUE: 551 Ohm
MDC IDC MSMT LEADCHNL LV IMPEDANCE VALUE: 551 Ohm
MDC IDC MSMT LEADCHNL LV IMPEDANCE VALUE: 570 Ohm
MDC IDC MSMT LEADCHNL LV PACING THRESHOLD AMPLITUDE: 4.25 V
MDC IDC MSMT LEADCHNL RA PACING THRESHOLD PULSEWIDTH: 0.4 ms
MDC IDC MSMT LEADCHNL RV IMPEDANCE VALUE: 551 Ohm
MDC IDC MSMT LEADCHNL RV SENSING INTR AMPL: 13.25 mV
MDC IDC MSMT LEADCHNL RV SENSING INTR AMPL: 13.25 mV
MDC IDC SET LEADCHNL LV PACING AMPLITUDE: 5.5 V
MDC IDC SET LEADCHNL LV PACING PULSEWIDTH: 0.8 ms
MDC IDC SET LEADCHNL RV SENSING SENSITIVITY: 0.3 mV
MDC IDC STAT BRADY AS VS PERCENT: 1.42 %
MDC IDC STAT BRADY RA PERCENT PACED: 0.07 %
MDC IDC STAT BRADY RV PERCENT PACED: 29.17 %

## 2016-07-06 ENCOUNTER — Encounter: Payer: Self-pay | Admitting: Internal Medicine

## 2016-07-13 ENCOUNTER — Other Ambulatory Visit: Payer: Self-pay | Admitting: Interventional Cardiology

## 2016-07-15 ENCOUNTER — Encounter: Payer: Self-pay | Admitting: Internal Medicine

## 2016-07-15 ENCOUNTER — Ambulatory Visit (INDEPENDENT_AMBULATORY_CARE_PROVIDER_SITE_OTHER): Payer: Medicare Other | Admitting: Internal Medicine

## 2016-07-15 VITALS — BP 126/84 | HR 76 | Ht 67.5 in | Wt 231.2 lb

## 2016-07-15 DIAGNOSIS — Z95810 Presence of automatic (implantable) cardiac defibrillator: Secondary | ICD-10-CM | POA: Diagnosis not present

## 2016-07-15 NOTE — Progress Notes (Signed)
HPI Ms. Springer returns today to followup her BiV ICD, implanted a year ago. She is a pleasant 75 yo wLaural Ben[place]oman with a h/o chronic systolic heart failure, initially diagnosed 22 months ago. Catheterization demonstrated no obstructive CAD. She has LBBB with a QRS duration of 180 ms. She was treated with maximal medical therapy under the direction of Dr. Katrinka BlazingSmith but her LV function did not improve at 25%. She underwent Biv ICD insertion 12 months ago. In the interim, she has done well. She continues to work and denies sob. She does note some dietary indiscretion and has not lost any weight.   Allergies  Allergen Reactions  . Shellfish Allergy Itching     Current Outpatient Prescriptions  Medication Sig Dispense Refill  . aspirin EC 81 MG EC tablet Take 1 tablet (81 mg total) by mouth daily.    . carvedilol (COREG) 12.5 MG tablet TAKE 1 TABLET BY MOUTH TWICE DAILY WITH A MEAL 180 tablet 1  . irbesartan-hydrochlorothiazide (AVALIDE) 300-12.5 MG tablet Take 1 tablet by mouth daily. 90 tablet 3  . nitroGLYCERIN (NITROSTAT) 0.4 MG SL tablet Place 1 tablet (0.4 mg total) under the tongue every 5 (five) minutes as needed for chest pain. 25 tablet 4  . potassium chloride SA (K-DUR,KLOR-CON) 20 MEQ tablet Take 1 tablet (20 mEq total) by mouth daily. 30 tablet 11  . Vitamin D, Ergocalciferol, (DRISDOL) 50000 UNITS CAPS capsule Take 50,000 Units by mouth 2 (two) times a week.      No current facility-administered medications for this visit.      Past Medical History:  Diagnosis Date  . Arthritis    "maybe in my hands/fingers; not terrible" (07/12/2015)  . Chronic systolic CHF (congestive heart failure) (HCC)   . Dilated cardiomyopathy (HCC)   . Episode of dizziness, secondary to cardiomyopathy possibly  10/25/2014  . Essential hypertension   . Hypertensive heart disease   . LBBB (left bundle branch block)    a. noted on 10/24/14 admission for dizziness   . NICM (nonischemic cardiomyopathy) (HCC)     a. Cath 10/29/14: normal coronary arteries (tortuous, compatible with hypertension). b. s/p Medtronic BiV-ICD 06/2015.  . Tobacco abuse     ROS:   All systems reviewed and negative except as noted in the HPI.   Past Surgical History:  Procedure Laterality Date  . ABDOMINAL HYSTERECTOMY  1970's  . APPENDECTOMY    . BI-VENTRICULAR IMPLANTABLE CARDIOVERTER DEFIBRILLATOR  (CRT-D)  07/11/2015  . CARDIAC CATHETERIZATION    . EP IMPLANTABLE DEVICE N/A 07/11/2015   Procedure: BiV ICD Insertion CRT-D;  Surgeon: Marinus MawGregg W Anni Hocevar, MD;  [facility];  Service: Cardiovascular;  Laterality: N/A;  . LEFT HEART CATHETERIZATION WITH CORONARY ANGIOGRAM N/A 10/29/2014   Procedure: LEFT HEART CATHETERIZATION WITH CORONARY ANGIOGRAM;  Surgeon: Lesleigh NoeHenry W Smith III, MD;  [facility];  Service: Cardiovascular;  Laterality: N/A;  . TONSILLECTOMY    . TUBAL LIGATION  1970's     Family History  Problem Relation Age of Onset  . Arrhythmia Mother   . Hypertension Mother   . Diabetes Mother   . Heart disease Mother   . Cancer Father   . Lung cancer Sister   . Healthy Paternal Aunt   . Heart attack Neg Hx   . Stroke Neg Hx      Social History   Social History  . Marital status: Married    Spouse name: N/A  . Number of children:  N/A  . Years of education: N/A   Occupational History  . Not on file.   Social History Main Topics  . Smoking status: Current Every Day Smoker    Packs/day: 0.33    Years: 55.00    Types: Cigarettes  . Smokeless tobacco: Never Used  . Alcohol use No     Comment: on special occasions  . Drug use: No  . Sexual activity: Yes   Other Topics Concern  . Not on file   Social History Narrative  . No narrative on file     BP 126/84   Pulse 76   Ht 5' 7.5" (1.715 m)   Wt 231 lb 4 oz (104.9 kg)   BMI 35.68 kg/m   Physical Exam:  stable appearing 75 yo woman, NAD HEENT: Unremarkable Neck:  7 cm JVD, no thyromegally Back:  No CVA  tenderness Lungs:  Clear with no wheezes HEART:  Regular rate rhythm, no murmurs, no rubs, no clicks Abd:  soft, positive bowel sounds, no organomegally, no rebound, no guarding Ext:  2 plus pulses, no edema, no cyanosis, no clubbing Skin:  No rashes no nodules Neuro:  CN II through XII intact, motor grossly intact  EKG - nsr with BiV pacing  Assess/Plan: 1. Chronic systolic heart failure - her symtoms are improved since her device was place. She will continue her current meds. 2. HTN - her blood pressure remains elevated. I have encouraged her to lose weight and reduce her sodium intake 3. ICD - her LV lead has had an increased pacing threshold. Today reprogrammed her device and her longevity has increased a year,  Leonia Reeves.D.

## 2016-07-15 NOTE — Patient Instructions (Signed)
Medication Instructions:  Your physician recommends that you continue on your current medications as directed. Please refer to the Current Medication list given to you today.   Labwork: None Ordered   Testing/Procedures: None Ordered   Follow-Up: Your physician wants you to follow-up in: 1 year with Dr. Ladona Ridgel. You will receive a reminder letter in the mail two months in advance. If you don't receive a letter, please call our office to schedule the follow-up appointment.  Remote monitoring is used to monitor your ICD from home. This monitoring reduces the number of office visits required to check your device to one time per year. It allows [place] to keep an eye on the functioning of your device to ensure it is working properly. You are scheduled for a device check from home on 10/14/16. You may send your transmission at any time that day. If you have a wireless device, the transmission will be sent automatically. After your physician reviews your transmission, you will receive a postcard with your next transmission date.    Any Other Special Instructions Will Be Listed Below (If Applicable).     If you need a refill on your cardiac medications before your next appointment, please call your pharmacy.

## 2016-07-17 LAB — CUP PACEART INCLINIC DEVICE CHECK
Brady Statistic AP VS Percent: 0.01 %
Brady Statistic AS VP Percent: 98.5 %
Brady Statistic RA Percent Paced: 0.09 %
Brady Statistic RV Percent Paced: 27.41 %
HighPow Impedance: 77 Ohm
Implantable Lead Implant Date: 20161013
Implantable Lead Location: 753859
Lead Channel Impedance Value: 285 Ohm
Lead Channel Impedance Value: 342 Ohm
Lead Channel Impedance Value: 380 Ohm
Lead Channel Impedance Value: 437 Ohm
Lead Channel Impedance Value: 513 Ohm
Lead Channel Impedance Value: 551 Ohm
Lead Channel Impedance Value: 551 Ohm
Lead Channel Pacing Threshold Amplitude: 0.625 V
Lead Channel Pacing Threshold Pulse Width: 0.4 ms
Lead Channel Setting Pacing Amplitude: 3.25 V
Lead Channel Setting Sensing Sensitivity: 0.3 mV
MDC IDC LEAD IMPLANT DT: 20161013
MDC IDC LEAD IMPLANT DT: 20161013
MDC IDC LEAD LOCATION: 753858
MDC IDC LEAD LOCATION: 753860
MDC IDC LEAD MODEL: 4398
MDC IDC MSMT BATTERY REMAINING LONGEVITY: 31 mo
MDC IDC MSMT BATTERY VOLTAGE: 2.92 V
MDC IDC MSMT LEADCHNL LV IMPEDANCE VALUE: 399 Ohm
MDC IDC MSMT LEADCHNL LV IMPEDANCE VALUE: 570 Ohm
MDC IDC MSMT LEADCHNL LV IMPEDANCE VALUE: 570 Ohm
MDC IDC MSMT LEADCHNL LV IMPEDANCE VALUE: 608 Ohm
MDC IDC MSMT LEADCHNL LV IMPEDANCE VALUE: 646 Ohm
MDC IDC MSMT LEADCHNL LV PACING THRESHOLD AMPLITUDE: 3.75 V
MDC IDC MSMT LEADCHNL LV PACING THRESHOLD PULSEWIDTH: 0.8 ms
MDC IDC MSMT LEADCHNL RA PACING THRESHOLD PULSEWIDTH: 0.4 ms
MDC IDC MSMT LEADCHNL RA SENSING INTR AMPL: 3.375 mV
MDC IDC MSMT LEADCHNL RV IMPEDANCE VALUE: 608 Ohm
MDC IDC MSMT LEADCHNL RV PACING THRESHOLD AMPLITUDE: 0.75 V
MDC IDC MSMT LEADCHNL RV SENSING INTR AMPL: 20.5 mV
MDC IDC SESS DTM: 20171018182339
MDC IDC SET LEADCHNL LV PACING PULSEWIDTH: 0.6 ms
MDC IDC SET LEADCHNL RA PACING AMPLITUDE: 1.5 V
MDC IDC STAT BRADY AP VP PERCENT: 0.08 %
MDC IDC STAT BRADY AS VS PERCENT: 1.41 %

## 2016-09-04 ENCOUNTER — Other Ambulatory Visit: Payer: Self-pay | Admitting: Internal Medicine

## 2016-09-04 DIAGNOSIS — Z1231 Encounter for screening mammogram for malignant neoplasm of breast: Secondary | ICD-10-CM

## 2016-09-04 DIAGNOSIS — E2839 Other primary ovarian failure: Secondary | ICD-10-CM

## 2016-10-01 ENCOUNTER — Other Ambulatory Visit: Payer: Self-pay | Admitting: Gastroenterology

## 2016-10-09 ENCOUNTER — Other Ambulatory Visit: Payer: Medicare Other

## 2016-10-09 ENCOUNTER — Ambulatory Visit: Payer: Medicare Other

## 2016-10-14 ENCOUNTER — Encounter: Payer: Medicare Other | Admitting: *Deleted

## 2016-10-15 ENCOUNTER — Other Ambulatory Visit: Payer: Self-pay | Admitting: Interventional Cardiology

## 2016-10-16 ENCOUNTER — Encounter: Payer: Self-pay | Admitting: Cardiology

## 2016-11-13 ENCOUNTER — Ambulatory Visit (INDEPENDENT_AMBULATORY_CARE_PROVIDER_SITE_OTHER): Payer: Medicare Other | Admitting: *Deleted

## 2016-11-13 ENCOUNTER — Telehealth: Payer: Self-pay | Admitting: Internal Medicine

## 2016-11-13 DIAGNOSIS — I5022 Chronic systolic (congestive) heart failure: Secondary | ICD-10-CM

## 2016-11-13 NOTE — Progress Notes (Signed)
Remote Transmission 

## 2016-11-13 NOTE — Telephone Encounter (Signed)
New Message     Received letter she missed her transmission please call

## 2016-11-17 ENCOUNTER — Encounter: Payer: Self-pay | Admitting: Cardiology

## 2016-11-20 LAB — CUP PACEART REMOTE DEVICE CHECK
Battery Remaining Longevity: 35 mo
Battery Voltage: 2.95 V
Brady Statistic AP VP Percent: 0.03 %
Brady Statistic AS VS Percent: 1.39 %
HighPow Impedance: 72 Ohm
Implantable Lead Implant Date: 20161013
Implantable Lead Implant Date: 20161013
Implantable Lead Location: 753858
Implantable Lead Model: 4398
Implantable Lead Model: 5076
Lead Channel Impedance Value: 149.625
Lead Channel Impedance Value: 266 Ohm
Lead Channel Impedance Value: 323 Ohm
Lead Channel Impedance Value: 342 Ohm
Lead Channel Impedance Value: 380 Ohm
Lead Channel Impedance Value: 437 Ohm
Lead Channel Impedance Value: 513 Ohm
Lead Channel Impedance Value: 551 Ohm
Lead Channel Impedance Value: 608 Ohm
Lead Channel Pacing Threshold Amplitude: 0.625 V
Lead Channel Pacing Threshold Amplitude: 3.75 V
Lead Channel Pacing Threshold Pulse Width: 0.4 ms
Lead Channel Pacing Threshold Pulse Width: 0.4 ms
Lead Channel Pacing Threshold Pulse Width: 0.8 ms
Lead Channel Sensing Intrinsic Amplitude: 15.625 mV
Lead Channel Sensing Intrinsic Amplitude: 2.625 mV
Lead Channel Setting Pacing Amplitude: 1.5 V
Lead Channel Setting Pacing Pulse Width: 0.6 ms
Lead Channel Setting Sensing Sensitivity: 0.3 mV
MDC IDC LEAD IMPLANT DT: 20161013
MDC IDC LEAD LOCATION: 753859
MDC IDC LEAD LOCATION: 753860
MDC IDC MSMT LEADCHNL LV IMPEDANCE VALUE: 145.871
MDC IDC MSMT LEADCHNL LV IMPEDANCE VALUE: 156.471
MDC IDC MSMT LEADCHNL LV IMPEDANCE VALUE: 166.114
MDC IDC MSMT LEADCHNL LV IMPEDANCE VALUE: 180 Ohm
MDC IDC MSMT LEADCHNL LV IMPEDANCE VALUE: 456 Ohm
MDC IDC MSMT LEADCHNL LV IMPEDANCE VALUE: 551 Ohm
MDC IDC MSMT LEADCHNL LV IMPEDANCE VALUE: 570 Ohm
MDC IDC MSMT LEADCHNL LV IMPEDANCE VALUE: 570 Ohm
MDC IDC MSMT LEADCHNL RA SENSING INTR AMPL: 2.625 mV
MDC IDC MSMT LEADCHNL RV IMPEDANCE VALUE: 494 Ohm
MDC IDC MSMT LEADCHNL RV PACING THRESHOLD AMPLITUDE: 0.75 V
MDC IDC MSMT LEADCHNL RV SENSING INTR AMPL: 15.625 mV
MDC IDC PG IMPLANT DT: 20161013
MDC IDC SESS DTM: 20180216210034
MDC IDC SET LEADCHNL LV PACING AMPLITUDE: 3.25 V
MDC IDC SET LEADCHNL RV PACING AMPLITUDE: 2 V
MDC IDC SET LEADCHNL RV PACING PULSEWIDTH: 0.3 ms
MDC IDC STAT BRADY AP VS PERCENT: 0 %
MDC IDC STAT BRADY AS VP PERCENT: 98.58 %
MDC IDC STAT BRADY RA PERCENT PACED: 0.03 %
MDC IDC STAT BRADY RV PERCENT PACED: 24.18 %

## 2016-11-23 ENCOUNTER — Other Ambulatory Visit: Payer: Self-pay | Admitting: Physician Assistant

## 2016-11-23 DIAGNOSIS — I10 Essential (primary) hypertension: Secondary | ICD-10-CM

## 2016-12-04 ENCOUNTER — Encounter (HOSPITAL_COMMUNITY): Payer: Self-pay | Admitting: *Deleted

## 2017-02-10 ENCOUNTER — Other Ambulatory Visit: Payer: Self-pay | Admitting: Interventional Cardiology

## 2017-02-15 ENCOUNTER — Ambulatory Visit (INDEPENDENT_AMBULATORY_CARE_PROVIDER_SITE_OTHER): Payer: Medicare Other | Admitting: *Deleted

## 2017-02-15 DIAGNOSIS — I428 Other cardiomyopathies: Secondary | ICD-10-CM | POA: Diagnosis not present

## 2017-02-15 NOTE — Progress Notes (Signed)
Remote ICD transmission.   

## 2017-02-16 LAB — CUP PACEART REMOTE DEVICE CHECK
Battery Voltage: 2.89 V
Brady Statistic AP VP Percent: 0.06 %
Brady Statistic AS VP Percent: 98.52 %
Brady Statistic AS VS Percent: 1.42 %
Brady Statistic RA Percent Paced: 0.07 %
Brady Statistic RV Percent Paced: 23.73 %
Date Time Interrogation Session: 20180521041703
HighPow Impedance: 89 Ohm
Implantable Lead Implant Date: 20161013
Implantable Lead Implant Date: 20161013
Implantable Lead Location: 753859
Lead Channel Impedance Value: 145.871
Lead Channel Impedance Value: 156.471
Lead Channel Impedance Value: 166.114
Lead Channel Impedance Value: 266 Ohm
Lead Channel Impedance Value: 494 Ohm
Lead Channel Impedance Value: 513 Ohm
Lead Channel Impedance Value: 551 Ohm
Lead Channel Impedance Value: 551 Ohm
Lead Channel Impedance Value: 570 Ohm
Lead Channel Impedance Value: 646 Ohm
Lead Channel Pacing Threshold Amplitude: 3.75 V
Lead Channel Pacing Threshold Pulse Width: 0.4 ms
Lead Channel Pacing Threshold Pulse Width: 0.8 ms
Lead Channel Sensing Intrinsic Amplitude: 14.875 mV
Lead Channel Setting Pacing Amplitude: 1.5 V
Lead Channel Setting Pacing Pulse Width: 0.3 ms
Lead Channel Setting Pacing Pulse Width: 0.6 ms
Lead Channel Setting Sensing Sensitivity: 0.3 mV
MDC IDC LEAD IMPLANT DT: 20161013
MDC IDC LEAD LOCATION: 753858
MDC IDC LEAD LOCATION: 753860
MDC IDC MSMT BATTERY REMAINING LONGEVITY: 30 mo
MDC IDC MSMT LEADCHNL LV IMPEDANCE VALUE: 149.625
MDC IDC MSMT LEADCHNL LV IMPEDANCE VALUE: 174.595
MDC IDC MSMT LEADCHNL LV IMPEDANCE VALUE: 323 Ohm
MDC IDC MSMT LEADCHNL LV IMPEDANCE VALUE: 342 Ohm
MDC IDC MSMT LEADCHNL LV IMPEDANCE VALUE: 380 Ohm
MDC IDC MSMT LEADCHNL LV IMPEDANCE VALUE: 551 Ohm
MDC IDC MSMT LEADCHNL RA IMPEDANCE VALUE: 399 Ohm
MDC IDC MSMT LEADCHNL RA PACING THRESHOLD AMPLITUDE: 0.625 V
MDC IDC MSMT LEADCHNL RA SENSING INTR AMPL: 2 mV
MDC IDC MSMT LEADCHNL RA SENSING INTR AMPL: 2 mV
MDC IDC MSMT LEADCHNL RV IMPEDANCE VALUE: 456 Ohm
MDC IDC MSMT LEADCHNL RV PACING THRESHOLD AMPLITUDE: 0.75 V
MDC IDC MSMT LEADCHNL RV PACING THRESHOLD PULSEWIDTH: 0.4 ms
MDC IDC MSMT LEADCHNL RV SENSING INTR AMPL: 14.875 mV
MDC IDC PG IMPLANT DT: 20161013
MDC IDC SET LEADCHNL LV PACING AMPLITUDE: 3.25 V
MDC IDC SET LEADCHNL RV PACING AMPLITUDE: 2 V
MDC IDC STAT BRADY AP VS PERCENT: 0 %

## 2017-02-17 ENCOUNTER — Encounter: Payer: Self-pay | Admitting: Cardiology

## 2017-02-19 ENCOUNTER — Encounter: Payer: Self-pay | Admitting: Cardiology

## 2017-02-19 NOTE — Progress Notes (Signed)
Letter  

## 2017-02-24 ENCOUNTER — Other Ambulatory Visit: Payer: Self-pay | Admitting: Interventional Cardiology

## 2017-03-11 ENCOUNTER — Ambulatory Visit (HOSPITAL_COMMUNITY): Admit: 2017-03-11 | Payer: Medicare Other | Admitting: Gastroenterology

## 2017-03-11 SURGERY — COLONOSCOPY WITH PROPOFOL
Anesthesia: Monitor Anesthesia Care

## 2017-04-29 ENCOUNTER — Other Ambulatory Visit: Payer: Self-pay | Admitting: Internal Medicine

## 2017-05-17 ENCOUNTER — Ambulatory Visit (INDEPENDENT_AMBULATORY_CARE_PROVIDER_SITE_OTHER): Payer: Medicare Other | Admitting: *Deleted

## 2017-05-17 ENCOUNTER — Telehealth: Payer: Self-pay | Admitting: Internal Medicine

## 2017-05-17 DIAGNOSIS — I428 Other cardiomyopathies: Secondary | ICD-10-CM | POA: Diagnosis not present

## 2017-05-17 NOTE — Telephone Encounter (Signed)
Nicole Chan is calling because she is does not know how to send the transmission . Please call

## 2017-05-18 NOTE — Progress Notes (Signed)
Remote ICD transmission.   

## 2017-05-18 NOTE — Telephone Encounter (Signed)
Transmission received automatically. Pt made aware,

## 2017-05-19 LAB — CUP PACEART REMOTE DEVICE CHECK
Brady Statistic AP VP Percent: 0.09 %
Brady Statistic AP VS Percent: 0 %
Brady Statistic AS VP Percent: 98.48 %
Brady Statistic AS VS Percent: 1.43 %
Date Time Interrogation Session: 20180820084225
HighPow Impedance: 81 Ohm
Implantable Lead Implant Date: 20161013
Implantable Lead Implant Date: 20161013
Implantable Pulse Generator Implant Date: 20161013
Lead Channel Impedance Value: 145.871
Lead Channel Impedance Value: 149.625
Lead Channel Impedance Value: 156.471
Lead Channel Impedance Value: 266 Ohm
Lead Channel Impedance Value: 323 Ohm
Lead Channel Impedance Value: 494 Ohm
Lead Channel Impedance Value: 494 Ohm
Lead Channel Impedance Value: 513 Ohm
Lead Channel Impedance Value: 513 Ohm
Lead Channel Impedance Value: 570 Ohm
Lead Channel Pacing Threshold Amplitude: 3.75 V
Lead Channel Pacing Threshold Pulse Width: 0.4 ms
Lead Channel Pacing Threshold Pulse Width: 0.8 ms
Lead Channel Sensing Intrinsic Amplitude: 15.875 mV
Lead Channel Sensing Intrinsic Amplitude: 2.125 mV
Lead Channel Setting Pacing Amplitude: 1.5 V
Lead Channel Setting Pacing Amplitude: 2 V
Lead Channel Setting Pacing Pulse Width: 0.3 ms
Lead Channel Setting Pacing Pulse Width: 0.6 ms
MDC IDC LEAD IMPLANT DT: 20161013
MDC IDC LEAD LOCATION: 753858
MDC IDC LEAD LOCATION: 753859
MDC IDC LEAD LOCATION: 753860
MDC IDC MSMT BATTERY REMAINING LONGEVITY: 24 mo
MDC IDC MSMT BATTERY VOLTAGE: 2.87 V
MDC IDC MSMT LEADCHNL LV IMPEDANCE VALUE: 166.114
MDC IDC MSMT LEADCHNL LV IMPEDANCE VALUE: 174.595
MDC IDC MSMT LEADCHNL LV IMPEDANCE VALUE: 342 Ohm
MDC IDC MSMT LEADCHNL LV IMPEDANCE VALUE: 380 Ohm
MDC IDC MSMT LEADCHNL LV IMPEDANCE VALUE: 570 Ohm
MDC IDC MSMT LEADCHNL LV IMPEDANCE VALUE: 570 Ohm
MDC IDC MSMT LEADCHNL LV IMPEDANCE VALUE: 608 Ohm
MDC IDC MSMT LEADCHNL RA IMPEDANCE VALUE: 437 Ohm
MDC IDC MSMT LEADCHNL RA PACING THRESHOLD AMPLITUDE: 0.625 V
MDC IDC MSMT LEADCHNL RA SENSING INTR AMPL: 2.125 mV
MDC IDC MSMT LEADCHNL RV PACING THRESHOLD AMPLITUDE: 0.75 V
MDC IDC MSMT LEADCHNL RV PACING THRESHOLD PULSEWIDTH: 0.4 ms
MDC IDC MSMT LEADCHNL RV SENSING INTR AMPL: 15.875 mV
MDC IDC SET LEADCHNL LV PACING AMPLITUDE: 3.25 V
MDC IDC SET LEADCHNL RV SENSING SENSITIVITY: 0.3 mV
MDC IDC STAT BRADY RA PERCENT PACED: 0.09 %
MDC IDC STAT BRADY RV PERCENT PACED: 20.65 %

## 2017-05-28 ENCOUNTER — Encounter: Payer: Self-pay | Admitting: Cardiology

## 2017-05-28 NOTE — Progress Notes (Signed)
Letter  

## 2017-07-14 ENCOUNTER — Other Ambulatory Visit: Payer: Self-pay | Admitting: Physician Assistant

## 2017-07-14 DIAGNOSIS — I10 Essential (primary) hypertension: Secondary | ICD-10-CM

## 2017-07-28 ENCOUNTER — Encounter (INDEPENDENT_AMBULATORY_CARE_PROVIDER_SITE_OTHER): Payer: Self-pay

## 2017-07-28 ENCOUNTER — Encounter: Payer: Self-pay | Admitting: Internal Medicine

## 2017-07-28 ENCOUNTER — Ambulatory Visit (INDEPENDENT_AMBULATORY_CARE_PROVIDER_SITE_OTHER): Payer: Medicare Other | Admitting: Internal Medicine

## 2017-07-28 VITALS — BP 160/98 | HR 72 | Ht 68.0 in | Wt 230.0 lb

## 2017-07-28 DIAGNOSIS — Z95810 Presence of automatic (implantable) cardiac defibrillator: Secondary | ICD-10-CM | POA: Diagnosis not present

## 2017-07-28 DIAGNOSIS — I447 Left bundle-branch block, unspecified: Secondary | ICD-10-CM

## 2017-07-28 DIAGNOSIS — I5022 Chronic systolic (congestive) heart failure: Secondary | ICD-10-CM | POA: Diagnosis not present

## 2017-07-28 NOTE — Progress Notes (Signed)
HPI Nicole Chan returns today for ongoing evaluation and management of her ICD and chronic systolic heart failure. She is a very pleasant 76 year old woman with a history of chronic systolic heart failure, a nonischemic cardiomyopathy, hypertension and obesity. She underwent biventricular ICD implantation secondary to all the above a couple of years ago. Her heart failure has remained class II. She has not been hospitalized with heart failure symptoms. She has had no ICD shocks. She denies syncope. The LV threshold has increased slightly over the years. Allergies  Allergen Reactions  . Shellfish Allergy Itching     Current Outpatient Prescriptions  Medication Sig Dispense Refill  . aspirin EC 81 MG EC tablet Take 1 tablet (81 mg total) by mouth daily.    . carvedilol (COREG) 12.5 MG tablet Take 12.5 mg by mouth daily.    . irbesartan-hydrochlorothiazide (AVALIDE) 300-12.5 MG tablet Take 1 tablet by mouth daily. Please keep upcoming appointment for future refills. Thank you 90 tablet 0  . nitroGLYCERIN (NITROSTAT) 0.4 MG SL tablet Place 1 tablet (0.4 mg total) under the tongue every 5 (five) minutes as needed for chest pain. 25 tablet 4  . potassium chloride SA (K-DUR,KLOR-CON) 20 MEQ tablet Take 1 tablet (20 mEq total) by mouth daily. 30 tablet 11  . Vitamin D, Ergocalciferol, (DRISDOL) 50000 UNITS CAPS capsule Take 50,000 Units by mouth 2 (two) times a week.      No current facility-administered medications for this visit.      Past Medical History:  Diagnosis Date  . Arthritis    "maybe in my hands/fingers; not terrible" (07/12/2015)  . Chronic systolic CHF (congestive heart failure) (HCC)   . Dilated cardiomyopathy (HCC)   . Episode of dizziness, secondary to cardiomyopathy possibly  10/25/2014  . Essential hypertension   . Hypertensive heart disease   . LBBB (left bundle branch block)    a. noted on 10/24/14 admission for dizziness   . NICM (nonischemic cardiomyopathy)  (HCC)    a. Cath 10/29/14: normal coronary arteries (tortuous, compatible with hypertension). b. s/p Medtronic BiV-ICD 06/2015.  . Tobacco abuse     ROS:   All systems reviewed and negative except as noted in the HPI.   Past Surgical History:  Procedure Laterality Date  . ABDOMINAL HYSTERECTOMY  1970's  . APPENDECTOMY    . BI-VENTRICULAR IMPLANTABLE CARDIOVERTER DEFIBRILLATOR  (CRT-D)  07/11/2015  . CARDIAC CATHETERIZATION    . EP IMPLANTABLE DEVICE N/A 07/11/2015   Procedure: BiV ICD Insertion CRT-D;  Surgeon: Marinus MawGregg W Taylor, MD;  [facility];  Service: Cardiovascular;  Laterality: N/A;  . LEFT HEART CATHETERIZATION WITH CORONARY ANGIOGRAM N/A 10/29/2014   Procedure: LEFT HEART CATHETERIZATION WITH CORONARY ANGIOGRAM;  Surgeon: Lesleigh NoeHenry W Smith III, MD;  [facility];  Service: Cardiovascular;  Laterality: N/A;  . TONSILLECTOMY    . TUBAL LIGATION  1970's     Family History  Problem Relation Age of Onset  . Arrhythmia Mother   . Hypertension Mother   . Diabetes Mother   . Heart disease Mother   . Cancer Father   . Lung cancer Sister   . Healthy Paternal Aunt   . Heart attack Neg Hx   . Stroke Neg Hx      Social History   Social History  . Marital status: Married    Spouse name: N/A  . Number of children: N/A  . Years of education: N/A   Occupational History  . Not  on file.   Social History Main Topics  . Smoking status: Current Every Day Smoker    Packs/day: 0.33    Years: 55.00    Types: Cigarettes  . Smokeless tobacco: Never Used  . Alcohol use No     Comment: on special occasions  . Drug use: No  . Sexual activity: Yes   Other Topics Concern  . Not on file   Social History Narrative  . No narrative on file     BP (!) 160/98   Pulse 72   Ht 5\' 8"  (1.727 m)   Wt 230 lb (104.3 kg)   BMI 34.97 kg/m   Physical Exam:  Well appearing NAD HEENT: Unremarkable Neck:  7 cm JVD, no thyromegally Lymphatics:  No  adenopathy Back:  No CVA tenderness Lungs:  Clear with no wheezes, rales, or rhonchi. HEART:  Regular rate rhythm, no murmurs, no rubs, no clicks Abd:  soft, positive bowel sounds, no organomegally, no rebound, no guarding Ext:  2 plus pulses, no edema, no cyanosis, no clubbing Skin:  No rashes no nodules Neuro:  CN II through XII intact, motor grossly intact  EKG - normal sinus rhythm with biventricular pacing  DEVICE  Normal device function.  See PaceArt for details.   Assess/Plan: 1. Chronic systolic heart failure - her symptoms are recently well controlled. She admits to some noncompliance with her beta blocker. I've asked that she restart and take her carvedilol as prescribed. 2. Hypertension - her blood pressure is elevated today. She admits to some sodium indiscretion and some medical noncompliance. We discussed the importance of both. 3. ICD - her Medtronic biventricular ICD is working normally. Her LV threshold was elevated a bit. We will follow this closely. Ultimately we will make a decision about whether to continue with the current LV lead, or whether to consider alternatives at the time of generator change out.   Nicole Chan, M.D.

## 2017-07-28 NOTE — Patient Instructions (Signed)
Medication Instructions:  Your physician recommends that you continue on your current medications as directed. Please refer to the Current Medication list given to you today.  Labwork: None ordered.  Testing/Procedures: None ordered.  Follow-Up: Your physician wants you to follow-up in: one year with Dr.Taylor.   You will receive a reminder letter in the mail two months in advance. If you don't receive a letter, please call our office to schedule the follow-up appointment.  Remote monitoring is used to monitor your ICD from home. This monitoring reduces the number of office visits required to check your device to one time per year. It allows [place] to keep an eye on the functioning of your device to ensure it is working properly. You are scheduled for a device check from home on 08/16/2017. You may send your transmission at any time that day. If you have a wireless device, the transmission will be sent automatically. After your physician reviews your transmission, you will receive a postcard with your next transmission date.    Any Other Special Instructions Will Be Listed Below (If Applicable).     If you need a refill on your cardiac medications before your next appointment, please call your pharmacy.

## 2017-08-06 LAB — CUP PACEART INCLINIC DEVICE CHECK
Brady Statistic AP VS Percent: 0 %
Brady Statistic RA Percent Paced: 0.1 %
Date Time Interrogation Session: 20181031192808
HighPow Impedance: 71 Ohm
Implantable Lead Implant Date: 20161013
Implantable Lead Location: 753858
Implantable Pulse Generator Implant Date: 20161013
Lead Channel Impedance Value: 155.455
Lead Channel Impedance Value: 180 Ohm
Lead Channel Impedance Value: 285 Ohm
Lead Channel Impedance Value: 342 Ohm
Lead Channel Impedance Value: 380 Ohm
Lead Channel Impedance Value: 399 Ohm
Lead Channel Impedance Value: 570 Ohm
Lead Channel Impedance Value: 646 Ohm
Lead Channel Pacing Threshold Amplitude: 0.75 V
Lead Channel Pacing Threshold Pulse Width: 0.4 ms
Lead Channel Setting Pacing Amplitude: 3.25 V
Lead Channel Setting Pacing Pulse Width: 0.6 ms
MDC IDC LEAD IMPLANT DT: 20161013
MDC IDC LEAD IMPLANT DT: 20161013
MDC IDC LEAD LOCATION: 753859
MDC IDC LEAD LOCATION: 753860
MDC IDC MSMT BATTERY REMAINING LONGEVITY: 22 mo
MDC IDC MSMT BATTERY VOLTAGE: 2.93 V
MDC IDC MSMT LEADCHNL LV IMPEDANCE VALUE: 155.455
MDC IDC MSMT LEADCHNL LV IMPEDANCE VALUE: 162.857
MDC IDC MSMT LEADCHNL LV IMPEDANCE VALUE: 171 Ohm
MDC IDC MSMT LEADCHNL LV IMPEDANCE VALUE: 342 Ohm
MDC IDC MSMT LEADCHNL LV IMPEDANCE VALUE: 494 Ohm
MDC IDC MSMT LEADCHNL LV IMPEDANCE VALUE: 513 Ohm
MDC IDC MSMT LEADCHNL LV IMPEDANCE VALUE: 551 Ohm
MDC IDC MSMT LEADCHNL LV IMPEDANCE VALUE: 570 Ohm
MDC IDC MSMT LEADCHNL LV PACING THRESHOLD AMPLITUDE: 2.75 V
MDC IDC MSMT LEADCHNL LV PACING THRESHOLD PULSEWIDTH: 0.6 ms
MDC IDC MSMT LEADCHNL RA PACING THRESHOLD AMPLITUDE: 0.5 V
MDC IDC MSMT LEADCHNL RA PACING THRESHOLD PULSEWIDTH: 0.4 ms
MDC IDC MSMT LEADCHNL RA SENSING INTR AMPL: 4 mV
MDC IDC MSMT LEADCHNL RV IMPEDANCE VALUE: 513 Ohm
MDC IDC MSMT LEADCHNL RV IMPEDANCE VALUE: 646 Ohm
MDC IDC MSMT LEADCHNL RV SENSING INTR AMPL: 17.5 mV
MDC IDC SET LEADCHNL RA PACING AMPLITUDE: 1.5 V
MDC IDC SET LEADCHNL RV PACING AMPLITUDE: 2 V
MDC IDC SET LEADCHNL RV PACING PULSEWIDTH: 0.3 ms
MDC IDC SET LEADCHNL RV SENSING SENSITIVITY: 0.3 mV
MDC IDC STAT BRADY AP VP PERCENT: 0.1 %
MDC IDC STAT BRADY AS VP PERCENT: 98.48 %
MDC IDC STAT BRADY AS VS PERCENT: 1.42 %
MDC IDC STAT BRADY RV PERCENT PACED: 22.11 %

## 2017-08-16 ENCOUNTER — Ambulatory Visit (INDEPENDENT_AMBULATORY_CARE_PROVIDER_SITE_OTHER): Payer: Medicare Other | Admitting: *Deleted

## 2017-08-16 DIAGNOSIS — I428 Other cardiomyopathies: Secondary | ICD-10-CM

## 2017-08-16 NOTE — Progress Notes (Signed)
Remote ICD transmission.   

## 2017-08-17 LAB — CUP PACEART REMOTE DEVICE CHECK
Brady Statistic AP VP Percent: 0.49 %
Brady Statistic AP VS Percent: 0.01 %
Brady Statistic AS VP Percent: 98.07 %
Brady Statistic RV Percent Paced: 16.5 %
HIGH POWER IMPEDANCE MEASURED VALUE: 76 Ohm
Implantable Lead Implant Date: 20161013
Implantable Lead Location: 753859
Implantable Lead Model: 5076
Implantable Pulse Generator Implant Date: 20161013
Lead Channel Impedance Value: 145.871
Lead Channel Impedance Value: 145.871
Lead Channel Impedance Value: 161.5 Ohm
Lead Channel Impedance Value: 380 Ohm
Lead Channel Impedance Value: 437 Ohm
Lead Channel Impedance Value: 551 Ohm
Lead Channel Impedance Value: 551 Ohm
Lead Channel Pacing Threshold Amplitude: 0.625 V
Lead Channel Pacing Threshold Amplitude: 0.75 V
Lead Channel Setting Pacing Amplitude: 2 V
Lead Channel Setting Pacing Amplitude: 3.25 V
Lead Channel Setting Pacing Pulse Width: 0.6 ms
Lead Channel Setting Sensing Sensitivity: 0.3 mV
MDC IDC LEAD IMPLANT DT: 20161013
MDC IDC LEAD IMPLANT DT: 20161013
MDC IDC LEAD LOCATION: 753858
MDC IDC LEAD LOCATION: 753860
MDC IDC MSMT BATTERY REMAINING LONGEVITY: 21 mo
MDC IDC MSMT BATTERY VOLTAGE: 2.93 V
MDC IDC MSMT LEADCHNL LV IMPEDANCE VALUE: 156.471
MDC IDC MSMT LEADCHNL LV IMPEDANCE VALUE: 174.595
MDC IDC MSMT LEADCHNL LV IMPEDANCE VALUE: 266 Ohm
MDC IDC MSMT LEADCHNL LV IMPEDANCE VALUE: 323 Ohm
MDC IDC MSMT LEADCHNL LV IMPEDANCE VALUE: 323 Ohm
MDC IDC MSMT LEADCHNL LV IMPEDANCE VALUE: 456 Ohm
MDC IDC MSMT LEADCHNL LV IMPEDANCE VALUE: 494 Ohm
MDC IDC MSMT LEADCHNL LV IMPEDANCE VALUE: 513 Ohm
MDC IDC MSMT LEADCHNL LV IMPEDANCE VALUE: 570 Ohm
MDC IDC MSMT LEADCHNL LV PACING THRESHOLD AMPLITUDE: 3.75 V
MDC IDC MSMT LEADCHNL LV PACING THRESHOLD PULSEWIDTH: 0.8 ms
MDC IDC MSMT LEADCHNL RA IMPEDANCE VALUE: 399 Ohm
MDC IDC MSMT LEADCHNL RA PACING THRESHOLD PULSEWIDTH: 0.4 ms
MDC IDC MSMT LEADCHNL RA SENSING INTR AMPL: 2.625 mV
MDC IDC MSMT LEADCHNL RA SENSING INTR AMPL: 2.625 mV
MDC IDC MSMT LEADCHNL RV IMPEDANCE VALUE: 551 Ohm
MDC IDC MSMT LEADCHNL RV PACING THRESHOLD PULSEWIDTH: 0.4 ms
MDC IDC MSMT LEADCHNL RV SENSING INTR AMPL: 17.5 mV
MDC IDC MSMT LEADCHNL RV SENSING INTR AMPL: 23.125 mV
MDC IDC SESS DTM: 20181119083424
MDC IDC SET LEADCHNL RA PACING AMPLITUDE: 1.5 V
MDC IDC SET LEADCHNL RV PACING PULSEWIDTH: 0.3 ms
MDC IDC STAT BRADY AS VS PERCENT: 1.44 %
MDC IDC STAT BRADY RA PERCENT PACED: 0.5 %

## 2017-08-26 ENCOUNTER — Encounter: Payer: Self-pay | Admitting: Cardiology

## 2017-10-10 ENCOUNTER — Other Ambulatory Visit: Payer: Self-pay | Admitting: Physician Assistant

## 2017-10-10 DIAGNOSIS — I10 Essential (primary) hypertension: Secondary | ICD-10-CM

## 2017-11-15 ENCOUNTER — Ambulatory Visit (INDEPENDENT_AMBULATORY_CARE_PROVIDER_SITE_OTHER): Payer: Medicare Other | Admitting: *Deleted

## 2017-11-15 DIAGNOSIS — I428 Other cardiomyopathies: Secondary | ICD-10-CM

## 2017-11-15 NOTE — Progress Notes (Signed)
Remote ICD transmission.   

## 2017-11-16 LAB — CUP PACEART REMOTE DEVICE CHECK
Battery Voltage: 2.92 V
Brady Statistic AP VS Percent: 0 %
Brady Statistic AS VP Percent: 98.53 %
Brady Statistic RA Percent Paced: 0.07 %
HIGH POWER IMPEDANCE MEASURED VALUE: 74 Ohm
Implantable Lead Implant Date: 20161013
Implantable Lead Location: 753859
Lead Channel Impedance Value: 155.455
Lead Channel Impedance Value: 166.25 Ohm
Lead Channel Impedance Value: 199.5 Ohm
Lead Channel Impedance Value: 399 Ohm
Lead Channel Impedance Value: 399 Ohm
Lead Channel Impedance Value: 494 Ohm
Lead Channel Impedance Value: 494 Ohm
Lead Channel Impedance Value: 513 Ohm
Lead Channel Impedance Value: 570 Ohm
Lead Channel Impedance Value: 646 Ohm
Lead Channel Pacing Threshold Amplitude: 3.75 V
Lead Channel Pacing Threshold Pulse Width: 0.8 ms
Lead Channel Sensing Intrinsic Amplitude: 17.875 mV
Lead Channel Sensing Intrinsic Amplitude: 17.875 mV
Lead Channel Setting Pacing Amplitude: 2 V
Lead Channel Setting Pacing Amplitude: 3.25 V
Lead Channel Setting Pacing Pulse Width: 0.3 ms
Lead Channel Setting Pacing Pulse Width: 0.6 ms
MDC IDC LEAD IMPLANT DT: 20161013
MDC IDC LEAD IMPLANT DT: 20161013
MDC IDC LEAD LOCATION: 753858
MDC IDC LEAD LOCATION: 753860
MDC IDC MSMT BATTERY REMAINING LONGEVITY: 19 mo
MDC IDC MSMT LEADCHNL LV IMPEDANCE VALUE: 166.25 Ohm
MDC IDC MSMT LEADCHNL LV IMPEDANCE VALUE: 184.154
MDC IDC MSMT LEADCHNL LV IMPEDANCE VALUE: 285 Ohm
MDC IDC MSMT LEADCHNL LV IMPEDANCE VALUE: 342 Ohm
MDC IDC MSMT LEADCHNL LV IMPEDANCE VALUE: 551 Ohm
MDC IDC MSMT LEADCHNL LV IMPEDANCE VALUE: 570 Ohm
MDC IDC MSMT LEADCHNL LV IMPEDANCE VALUE: 646 Ohm
MDC IDC MSMT LEADCHNL RA IMPEDANCE VALUE: 380 Ohm
MDC IDC MSMT LEADCHNL RA PACING THRESHOLD AMPLITUDE: 0.625 V
MDC IDC MSMT LEADCHNL RA PACING THRESHOLD PULSEWIDTH: 0.4 ms
MDC IDC MSMT LEADCHNL RA SENSING INTR AMPL: 2.375 mV
MDC IDC MSMT LEADCHNL RA SENSING INTR AMPL: 2.375 mV
MDC IDC MSMT LEADCHNL RV PACING THRESHOLD AMPLITUDE: 0.75 V
MDC IDC MSMT LEADCHNL RV PACING THRESHOLD PULSEWIDTH: 0.4 ms
MDC IDC PG IMPLANT DT: 20161013
MDC IDC SESS DTM: 20190218062304
MDC IDC SET LEADCHNL RA PACING AMPLITUDE: 1.5 V
MDC IDC SET LEADCHNL RV SENSING SENSITIVITY: 0.3 mV
MDC IDC STAT BRADY AP VP PERCENT: 0.07 %
MDC IDC STAT BRADY AS VS PERCENT: 1.4 %
MDC IDC STAT BRADY RV PERCENT PACED: 19.78 %

## 2017-11-17 ENCOUNTER — Encounter: Payer: Self-pay | Admitting: Cardiology

## 2018-01-12 ENCOUNTER — Other Ambulatory Visit: Payer: Self-pay | Admitting: *Deleted

## 2018-01-12 MED ORDER — CARVEDILOL 12.5 MG PO TABS: 12.5000 mg | ORAL_TABLET | Freq: Every day | ORAL | 1 refills | Status: DC

## 2018-01-13 ENCOUNTER — Other Ambulatory Visit: Payer: Self-pay

## 2018-02-14 ENCOUNTER — Ambulatory Visit (INDEPENDENT_AMBULATORY_CARE_PROVIDER_SITE_OTHER): Payer: Medicare Other | Admitting: *Deleted

## 2018-02-14 DIAGNOSIS — I428 Other cardiomyopathies: Secondary | ICD-10-CM | POA: Diagnosis not present

## 2018-02-14 NOTE — Progress Notes (Signed)
Remote ICD transmission.   

## 2018-02-15 ENCOUNTER — Encounter: Payer: Self-pay | Admitting: Cardiology

## 2018-02-15 LAB — CUP PACEART REMOTE DEVICE CHECK
Brady Statistic AP VS Percent: 0 %
Brady Statistic AS VS Percent: 1.45 %
Brady Statistic RA Percent Paced: 0.08 %
Date Time Interrogation Session: 20190520052503
HighPow Impedance: 68 Ohm
Implantable Lead Implant Date: 20161013
Implantable Lead Location: 753858
Implantable Lead Location: 753859
Implantable Lead Location: 753860
Implantable Pulse Generator Implant Date: 20161013
Lead Channel Impedance Value: 155.455
Lead Channel Impedance Value: 174.595
Lead Channel Impedance Value: 323 Ohm
Lead Channel Impedance Value: 380 Ohm
Lead Channel Impedance Value: 399 Ohm
Lead Channel Impedance Value: 513 Ohm
Lead Channel Impedance Value: 551 Ohm
Lead Channel Impedance Value: 570 Ohm
Lead Channel Pacing Threshold Amplitude: 0.75 V
Lead Channel Pacing Threshold Pulse Width: 0.4 ms
Lead Channel Pacing Threshold Pulse Width: 0.8 ms
Lead Channel Sensing Intrinsic Amplitude: 19.625 mV
Lead Channel Sensing Intrinsic Amplitude: 2 mV
Lead Channel Sensing Intrinsic Amplitude: 2 mV
Lead Channel Setting Pacing Amplitude: 1.5 V
Lead Channel Setting Pacing Amplitude: 2 V
Lead Channel Setting Pacing Pulse Width: 0.6 ms
Lead Channel Setting Sensing Sensitivity: 0.3 mV
MDC IDC LEAD IMPLANT DT: 20161013
MDC IDC LEAD IMPLANT DT: 20161013
MDC IDC MSMT BATTERY REMAINING LONGEVITY: 16 mo
MDC IDC MSMT BATTERY VOLTAGE: 2.92 V
MDC IDC MSMT LEADCHNL LV IMPEDANCE VALUE: 151.406
MDC IDC MSMT LEADCHNL LV IMPEDANCE VALUE: 162.857
MDC IDC MSMT LEADCHNL LV IMPEDANCE VALUE: 166.114
MDC IDC MSMT LEADCHNL LV IMPEDANCE VALUE: 285 Ohm
MDC IDC MSMT LEADCHNL LV IMPEDANCE VALUE: 342 Ohm
MDC IDC MSMT LEADCHNL LV IMPEDANCE VALUE: 494 Ohm
MDC IDC MSMT LEADCHNL LV IMPEDANCE VALUE: 513 Ohm
MDC IDC MSMT LEADCHNL LV IMPEDANCE VALUE: 551 Ohm
MDC IDC MSMT LEADCHNL LV PACING THRESHOLD AMPLITUDE: 3.75 V
MDC IDC MSMT LEADCHNL RA PACING THRESHOLD AMPLITUDE: 0.75 V
MDC IDC MSMT LEADCHNL RA PACING THRESHOLD PULSEWIDTH: 0.4 ms
MDC IDC MSMT LEADCHNL RV IMPEDANCE VALUE: 456 Ohm
MDC IDC MSMT LEADCHNL RV IMPEDANCE VALUE: 570 Ohm
MDC IDC MSMT LEADCHNL RV SENSING INTR AMPL: 19.625 mV
MDC IDC SET LEADCHNL LV PACING AMPLITUDE: 3.25 V
MDC IDC SET LEADCHNL RV PACING PULSEWIDTH: 0.3 ms
MDC IDC STAT BRADY AP VP PERCENT: 0.08 %
MDC IDC STAT BRADY AS VP PERCENT: 98.47 %
MDC IDC STAT BRADY RV PERCENT PACED: 22.06 %

## 2018-05-16 ENCOUNTER — Ambulatory Visit (INDEPENDENT_AMBULATORY_CARE_PROVIDER_SITE_OTHER): Payer: Medicare Other | Admitting: *Deleted

## 2018-05-16 DIAGNOSIS — I428 Other cardiomyopathies: Secondary | ICD-10-CM | POA: Diagnosis not present

## 2018-05-16 DIAGNOSIS — I5022 Chronic systolic (congestive) heart failure: Secondary | ICD-10-CM

## 2018-05-17 NOTE — Progress Notes (Signed)
Remote ICD transmission.   

## 2018-06-09 LAB — CUP PACEART REMOTE DEVICE CHECK
Battery Remaining Longevity: 15 mo
Battery Voltage: 2.91 V
Brady Statistic AP VP Percent: 0.18 %
Brady Statistic AS VS Percent: 1.4 %
Brady Statistic RA Percent Paced: 0.18 %
Brady Statistic RV Percent Paced: 22.76 %
HIGH POWER IMPEDANCE MEASURED VALUE: 90 Ohm
Implantable Lead Implant Date: 20161013
Implantable Lead Implant Date: 20161013
Implantable Lead Model: 4398
Lead Channel Impedance Value: 145.871
Lead Channel Impedance Value: 166.114
Lead Channel Impedance Value: 166.114
Lead Channel Impedance Value: 342 Ohm
Lead Channel Impedance Value: 437 Ohm
Lead Channel Impedance Value: 551 Ohm
Lead Channel Impedance Value: 570 Ohm
Lead Channel Pacing Threshold Amplitude: 3.75 V
Lead Channel Pacing Threshold Pulse Width: 0.4 ms
Lead Channel Pacing Threshold Pulse Width: 0.4 ms
Lead Channel Sensing Intrinsic Amplitude: 2.75 mV
Lead Channel Setting Pacing Amplitude: 1.5 V
Lead Channel Setting Pacing Amplitude: 2 V
Lead Channel Setting Pacing Amplitude: 3.25 V
Lead Channel Setting Pacing Pulse Width: 0.3 ms
Lead Channel Setting Pacing Pulse Width: 0.6 ms
MDC IDC LEAD IMPLANT DT: 20161013
MDC IDC LEAD LOCATION: 753858
MDC IDC LEAD LOCATION: 753859
MDC IDC LEAD LOCATION: 753860
MDC IDC MSMT LEADCHNL LV IMPEDANCE VALUE: 149.625
MDC IDC MSMT LEADCHNL LV IMPEDANCE VALUE: 149.625
MDC IDC MSMT LEADCHNL LV IMPEDANCE VALUE: 266 Ohm
MDC IDC MSMT LEADCHNL LV IMPEDANCE VALUE: 323 Ohm
MDC IDC MSMT LEADCHNL LV IMPEDANCE VALUE: 342 Ohm
MDC IDC MSMT LEADCHNL LV IMPEDANCE VALUE: 494 Ohm
MDC IDC MSMT LEADCHNL LV IMPEDANCE VALUE: 513 Ohm
MDC IDC MSMT LEADCHNL LV IMPEDANCE VALUE: 551 Ohm
MDC IDC MSMT LEADCHNL LV IMPEDANCE VALUE: 608 Ohm
MDC IDC MSMT LEADCHNL LV PACING THRESHOLD PULSEWIDTH: 0.8 ms
MDC IDC MSMT LEADCHNL RA IMPEDANCE VALUE: 380 Ohm
MDC IDC MSMT LEADCHNL RA PACING THRESHOLD AMPLITUDE: 0.625 V
MDC IDC MSMT LEADCHNL RA SENSING INTR AMPL: 2.75 mV
MDC IDC MSMT LEADCHNL RV IMPEDANCE VALUE: 513 Ohm
MDC IDC MSMT LEADCHNL RV PACING THRESHOLD AMPLITUDE: 0.75 V
MDC IDC MSMT LEADCHNL RV SENSING INTR AMPL: 14.625 mV
MDC IDC MSMT LEADCHNL RV SENSING INTR AMPL: 14.625 mV
MDC IDC PG IMPLANT DT: 20161013
MDC IDC SESS DTM: 20190819042204
MDC IDC SET LEADCHNL RV SENSING SENSITIVITY: 0.3 mV
MDC IDC STAT BRADY AP VS PERCENT: 0 %
MDC IDC STAT BRADY AS VP PERCENT: 98.42 %

## 2018-06-28 ENCOUNTER — Other Ambulatory Visit: Payer: Self-pay | Admitting: Internal Medicine

## 2018-07-20 ENCOUNTER — Encounter: Payer: Medicare Other | Admitting: Internal Medicine

## 2018-07-28 ENCOUNTER — Encounter (INDEPENDENT_AMBULATORY_CARE_PROVIDER_SITE_OTHER): Payer: Self-pay

## 2018-07-28 ENCOUNTER — Encounter: Payer: Self-pay | Admitting: Internal Medicine

## 2018-07-28 ENCOUNTER — Ambulatory Visit: Payer: Medicare Other | Admitting: Internal Medicine

## 2018-07-28 VITALS — BP 130/92 | HR 77 | Ht 68.0 in | Wt 230.2 lb

## 2018-07-28 DIAGNOSIS — Z95810 Presence of automatic (implantable) cardiac defibrillator: Secondary | ICD-10-CM | POA: Diagnosis not present

## 2018-07-28 DIAGNOSIS — I5022 Chronic systolic (congestive) heart failure: Secondary | ICD-10-CM | POA: Diagnosis not present

## 2018-07-28 DIAGNOSIS — I10 Essential (primary) hypertension: Secondary | ICD-10-CM

## 2018-07-28 DIAGNOSIS — I447 Left bundle-branch block, unspecified: Secondary | ICD-10-CM

## 2018-07-28 DIAGNOSIS — I428 Other cardiomyopathies: Secondary | ICD-10-CM | POA: Diagnosis not present

## 2018-07-28 NOTE — Patient Instructions (Signed)
Medication Instructions:  Your physician recommends that you continue on your current medications as directed. Please refer to the Current Medication list given to you today.  If you need a refill on your cardiac medications before your next appointment, please call your pharmacy.   Lab work: None ordered.  If you have labs (blood work) drawn today and your tests are completely normal, you will receive your results only by: Marland Kitchen MyChart Message (if you have MyChart) OR . A paper copy in the mail If you have any lab test that is abnormal or we need to change your treatment, we will call you to review the results.  Testing/Procedures: None ordered.  Follow-Up:  Your physician wants you to follow-up in: one year with Dr. Ladona Ridgel.   You will receive a reminder letter in the mail two months in advance. If you don't receive a letter, please call our office to schedule the follow-up appointment.  Remote monitoring is used to monitor your ICD from home. This monitoring reduces the number of office visits required to check your device to one time per year. It allows [place] to keep an eye on the functioning of your device to ensure it is working properly. You are scheduled for a device check from home on 08/15/2018. You may send your transmission at any time that day. If you have a wireless device, the transmission will be sent automatically. After your physician reviews your transmission, you will receive a postcard with your next transmission date.  At [facility], you and your health needs are our priority.  As part of our continuing mission to provide you with exceptional heart care, we have created designated Provider Care Teams.  These Care Teams include your primary Cardiologist (physician) and Advanced Practice Providers (APPs -  Physician Assistants and Nurse Practitioners) who all work together to provide you with the care you need, when you need it.  Any Other Special Instructions Will Be Listed  Below (If Applicable).

## 2018-07-28 NOTE — Progress Notes (Signed)
HPI [name] returns today for ongoing evaluation and management of her ICD and chronic systolic heart failure. She is a very pleasant 77 year old woman with a history of chronic systolic heart failure, a nonischemic cardiomyopathy, hypertension and obesity. She underwent biventricular ICD implantation secondary to all the above several years ago. Her heart failure has remained class II. She has not been hospitalized with heart failure symptoms. She has had no ICD shocks. She denies syncope. No edema. Allergies  Allergen Reactions  . Shellfish Allergy Itching     Current Outpatient Medications  Medication Sig Dispense Refill  . aspirin EC 81 MG EC tablet Take 1 tablet (81 mg total) by mouth daily.    . carvedilol (COREG) 12.5 MG tablet Take 1 tablet (12.5 mg total) by mouth 2 (two) times daily. Please keep upcoming appt with Dr. Ladona Ridgel in October. Thank you 180 tablet 0  . irbesartan-hydrochlorothiazide (AVALIDE) 300-12.5 MG tablet Take 1 tablet by mouth daily. 90 tablet 3  . nitroGLYCERIN (NITROSTAT) 0.4 MG SL tablet Place 1 tablet (0.4 mg total) under the tongue every 5 (five) minutes as needed for chest pain. 25 tablet 4  . potassium chloride SA (K-DUR,KLOR-CON) 20 MEQ tablet Take 1 tablet (20 mEq total) by mouth daily. 30 tablet 11  . Vitamin D, Ergocalciferol, (DRISDOL) 50000 UNITS CAPS capsule Take 50,000 Units by mouth 2 (two) times a week.      No current facility-administered medications for this visit.      Past Medical History:  Diagnosis Date  . Arthritis    "maybe in my hands/fingers; not terrible" (07/12/2015)  . Chronic systolic CHF (congestive heart failure) (HCC)   . Dilated cardiomyopathy (HCC)   . Episode of dizziness, secondary to cardiomyopathy possibly  10/25/2014  . Essential hypertension   . Hypertensive heart disease   . LBBB (left bundle branch block)    a. noted on 10/24/14 admission for dizziness   . NICM (nonischemic cardiomyopathy) (HCC)    a. Cath 10/29/14: normal coronary arteries (tortuous, compatible with hypertension). b. s/p Medtronic BiV-ICD 06/2015.  . Tobacco abuse     ROS:   All systems reviewed and negative except as noted in the HPI.   Past Surgical History:  Procedure Laterality Date  . ABDOMINAL HYSTERECTOMY  1970's  . APPENDECTOMY    . BI-VENTRICULAR IMPLANTABLE CARDIOVERTER DEFIBRILLATOR  (CRT-D)  07/11/2015  . CARDIAC CATHETERIZATION    . EP IMPLANTABLE DEVICE N/A 07/11/2015   Procedure: BiV ICD Insertion CRT-D;  Surgeon: Marinus Maw, MD;  Location: [facility] INVASIVE CV LAB;  Service: Cardiovascular;  Laterality: N/A;  . LEFT HEART CATHETERIZATION WITH CORONARY ANGIOGRAM N/A 10/29/2014   Procedure: LEFT HEART CATHETERIZATION WITH CORONARY ANGIOGRAM;  Surgeon: Lesleigh Noe, MD;  Location: [facility] CATH LAB;  Service: Cardiovascular;  Laterality: N/A;  . TONSILLECTOMY    . TUBAL LIGATION  1970's     Family History  Problem Relation Age of Onset  . Arrhythmia Mother   . Hypertension Mother   . Diabetes Mother   . Heart disease Mother   . Cancer Father   . Lung cancer Sister   . Healthy Paternal Aunt   . Heart attack Neg Hx   . Stroke Neg Hx      Social History   Socioeconomic History  . Marital status: Married    Spouse name: Not on file  . Number of children: Not on file  . Years of education: Not on file  .  Highest education level: Not on file  Occupational History  . Not on file  Social Needs  . Financial resource strain: Not on file  . Food insecurity:    Worry: Not on file    Inability: Not on file  . Transportation needs:    Medical: Not on file    Non-medical: Not on file  Tobacco Use  . Smoking status: Current Every Day Smoker    Packs/day: 0.33    Years: 55.00    Pack years: 18.15    Types: Cigarettes  . Smokeless tobacco: Never Used  Substance and Sexual Activity  . Alcohol use: No    Alcohol/week: 0.0 standard drinks    Comment: on special occasions  . Drug use: No  .  Sexual activity: Yes  Lifestyle  . Physical activity:    Days per week: Not on file    Minutes per session: Not on file  . Stress: Not on file  Relationships  . Social connections:    Talks on phone: Not on file    Gets together: Not on file    Attends religious service: Not on file    Active member of club or organization: Not on file    Attends meetings of clubs or organizations: Not on file    Relationship status: Not on file  . Intimate partner violence:    Fear of current or ex partner: Not on file    Emotionally abused: Not on file    Physically abused: Not on file    Forced sexual activity: Not on file  Other Topics Concern  . Not on file  Social History Narrative  . Not on file     BP (!) 130/92   Pulse 77   Ht 5\' 8"  (1.727 m)   Wt 230 lb 3.2 oz (104.4 kg)   SpO2 96%   BMI 35.00 kg/m   Physical Exam:  Well appearing obese woman, NAD HEENT: Unremarkable Neck:  6 cm JVD, no thyromegally Lymphatics:  No adenopathy Back:  No CVA tenderness Lungs:  Clear with no wheezes HEART:  Regular rate rhythm, no murmurs, no rubs, no clicks Abd:  soft, positive bowel sounds, no organomegally, no rebound, no guarding Ext:  2 plus pulses, no edema, no cyanosis, no clubbing Skin:  No rashes no nodules Neuro:  CN II through XII intact, motor grossly intact  EKG - nsr with biv pacing  DEVICE  Normal device function.  See PaceArt for details.   Assess/Plan: 1. Chronic systolic heart failure - she is euvolemic and class 2. She will continue her current meds. 2. HTN - her blood pressure today is fairly well controlled. No change in meds. 3. ICD - her Medtronic Biv ICD is working normally.  We will recheck in several months.  Leonia Reeves.D.

## 2018-07-29 LAB — CUP PACEART INCLINIC DEVICE CHECK
Battery Remaining Longevity: 15 mo
Battery Voltage: 2.9 V
Brady Statistic AP VP Percent: 0.17 %
Brady Statistic AP VS Percent: 0 %
Brady Statistic AS VS Percent: 1.42 %
Brady Statistic RA Percent Paced: 0.18 %
Date Time Interrogation Session: 20191031134044
HighPow Impedance: 71 Ohm
Implantable Lead Implant Date: 20161013
Implantable Lead Implant Date: 20161013
Implantable Lead Implant Date: 20161013
Implantable Lead Location: 753858
Implantable Lead Location: 753859
Lead Channel Impedance Value: 151.406
Lead Channel Impedance Value: 162.857
Lead Channel Impedance Value: 174.595
Lead Channel Impedance Value: 285 Ohm
Lead Channel Impedance Value: 323 Ohm
Lead Channel Impedance Value: 342 Ohm
Lead Channel Impedance Value: 380 Ohm
Lead Channel Impedance Value: 399 Ohm
Lead Channel Impedance Value: 494 Ohm
Lead Channel Impedance Value: 513 Ohm
Lead Channel Impedance Value: 703 Ohm
Lead Channel Pacing Threshold Pulse Width: 0.4 ms
Lead Channel Pacing Threshold Pulse Width: 0.4 ms
Lead Channel Pacing Threshold Pulse Width: 0.8 ms
Lead Channel Sensing Intrinsic Amplitude: 18.25 mV
Lead Channel Sensing Intrinsic Amplitude: 2.5 mV
Lead Channel Sensing Intrinsic Amplitude: 3 mV
Lead Channel Setting Pacing Amplitude: 1.5 V
Lead Channel Setting Pacing Amplitude: 2 V
Lead Channel Setting Pacing Amplitude: 3 V
Lead Channel Setting Pacing Pulse Width: 0.3 ms
Lead Channel Setting Pacing Pulse Width: 1 ms
MDC IDC LEAD LOCATION: 753860
MDC IDC MSMT LEADCHNL LV IMPEDANCE VALUE: 155.455
MDC IDC MSMT LEADCHNL LV IMPEDANCE VALUE: 166.114
MDC IDC MSMT LEADCHNL LV IMPEDANCE VALUE: 513 Ohm
MDC IDC MSMT LEADCHNL LV IMPEDANCE VALUE: 513 Ohm
MDC IDC MSMT LEADCHNL LV IMPEDANCE VALUE: 570 Ohm
MDC IDC MSMT LEADCHNL LV IMPEDANCE VALUE: 570 Ohm
MDC IDC MSMT LEADCHNL LV IMPEDANCE VALUE: 646 Ohm
MDC IDC MSMT LEADCHNL LV PACING THRESHOLD AMPLITUDE: 3.75 V
MDC IDC MSMT LEADCHNL RA PACING THRESHOLD AMPLITUDE: 0.75 V
MDC IDC MSMT LEADCHNL RV PACING THRESHOLD AMPLITUDE: 0.75 V
MDC IDC MSMT LEADCHNL RV SENSING INTR AMPL: 18.75 mV
MDC IDC PG IMPLANT DT: 20161013
MDC IDC SET LEADCHNL RV SENSING SENSITIVITY: 0.3 mV
MDC IDC STAT BRADY AS VP PERCENT: 98.4 %
MDC IDC STAT BRADY RV PERCENT PACED: 20.78 %

## 2018-08-15 ENCOUNTER — Ambulatory Visit (INDEPENDENT_AMBULATORY_CARE_PROVIDER_SITE_OTHER): Payer: Medicare Other

## 2018-08-15 DIAGNOSIS — I428 Other cardiomyopathies: Secondary | ICD-10-CM | POA: Diagnosis not present

## 2018-08-15 NOTE — Progress Notes (Signed)
Remote ICD transmission.   

## 2018-08-18 ENCOUNTER — Encounter: Payer: Self-pay | Admitting: Cardiology

## 2018-09-26 ENCOUNTER — Other Ambulatory Visit: Payer: Self-pay | Admitting: Internal Medicine

## 2018-10-11 LAB — CUP PACEART REMOTE DEVICE CHECK
Battery Remaining Longevity: 15 mo
Battery Voltage: 2.89 V
Brady Statistic AP VP Percent: 0.1 %
Brady Statistic AS VS Percent: 1.47 %
Date Time Interrogation Session: 20191118093724
HighPow Impedance: 79 Ohm
Implantable Lead Implant Date: 20161013
Implantable Lead Implant Date: 20161013
Implantable Lead Implant Date: 20161013
Implantable Lead Location: 753858
Lead Channel Impedance Value: 155.455
Lead Channel Impedance Value: 166.114
Lead Channel Impedance Value: 285 Ohm
Lead Channel Impedance Value: 323 Ohm
Lead Channel Impedance Value: 437 Ohm
Lead Channel Impedance Value: 494 Ohm
Lead Channel Impedance Value: 513 Ohm
Lead Channel Impedance Value: 551 Ohm
Lead Channel Impedance Value: 570 Ohm
Lead Channel Pacing Threshold Amplitude: 0.625 V
Lead Channel Pacing Threshold Amplitude: 3.75 V
Lead Channel Pacing Threshold Pulse Width: 0.4 ms
Lead Channel Pacing Threshold Pulse Width: 0.4 ms
Lead Channel Sensing Intrinsic Amplitude: 2.875 mV
Lead Channel Sensing Intrinsic Amplitude: 2.875 mV
Lead Channel Setting Pacing Amplitude: 1.5 V
Lead Channel Setting Pacing Pulse Width: 1 ms
Lead Channel Setting Sensing Sensitivity: 0.3 mV
MDC IDC LEAD LOCATION: 753859
MDC IDC LEAD LOCATION: 753860
MDC IDC MSMT LEADCHNL LV IMPEDANCE VALUE: 151.406
MDC IDC MSMT LEADCHNL LV IMPEDANCE VALUE: 155.455
MDC IDC MSMT LEADCHNL LV IMPEDANCE VALUE: 171 Ohm
MDC IDC MSMT LEADCHNL LV IMPEDANCE VALUE: 342 Ohm
MDC IDC MSMT LEADCHNL LV IMPEDANCE VALUE: 342 Ohm
MDC IDC MSMT LEADCHNL LV IMPEDANCE VALUE: 437 Ohm
MDC IDC MSMT LEADCHNL LV IMPEDANCE VALUE: 513 Ohm
MDC IDC MSMT LEADCHNL LV PACING THRESHOLD PULSEWIDTH: 0.8 ms
MDC IDC MSMT LEADCHNL RV IMPEDANCE VALUE: 494 Ohm
MDC IDC MSMT LEADCHNL RV IMPEDANCE VALUE: 570 Ohm
MDC IDC MSMT LEADCHNL RV PACING THRESHOLD AMPLITUDE: 0.75 V
MDC IDC MSMT LEADCHNL RV SENSING INTR AMPL: 24.125 mV
MDC IDC MSMT LEADCHNL RV SENSING INTR AMPL: 24.125 mV
MDC IDC PG IMPLANT DT: 20161013
MDC IDC SET LEADCHNL LV PACING AMPLITUDE: 3 V
MDC IDC SET LEADCHNL RV PACING AMPLITUDE: 2 V
MDC IDC SET LEADCHNL RV PACING PULSEWIDTH: 0.3 ms
MDC IDC STAT BRADY AP VS PERCENT: 0 %
MDC IDC STAT BRADY AS VP PERCENT: 98.42 %
MDC IDC STAT BRADY RA PERCENT PACED: 0.1 %
MDC IDC STAT BRADY RV PERCENT PACED: 21.85 %

## 2018-11-14 ENCOUNTER — Ambulatory Visit (INDEPENDENT_AMBULATORY_CARE_PROVIDER_SITE_OTHER): Payer: Medicare Other

## 2018-11-14 DIAGNOSIS — I428 Other cardiomyopathies: Secondary | ICD-10-CM

## 2018-11-14 DIAGNOSIS — I5022 Chronic systolic (congestive) heart failure: Secondary | ICD-10-CM

## 2018-11-14 LAB — CUP PACEART REMOTE DEVICE CHECK
Battery Voltage: 2.89 V
Brady Statistic AP VP Percent: 0.09 %
Brady Statistic AP VS Percent: 0 %
Brady Statistic AS VP Percent: 98.46 %
Brady Statistic RA Percent Paced: 0.09 %
Brady Statistic RV Percent Paced: 25.95 %
HighPow Impedance: 76 Ohm
Implantable Lead Implant Date: 20161013
Implantable Lead Implant Date: 20161013
Implantable Lead Location: 753858
Implantable Lead Location: 753860
Implantable Lead Model: 4398
Implantable Lead Model: 5076
Lead Channel Impedance Value: 180 Ohm
Lead Channel Impedance Value: 285 Ohm
Lead Channel Impedance Value: 323 Ohm
Lead Channel Impedance Value: 342 Ohm
Lead Channel Impedance Value: 380 Ohm
Lead Channel Impedance Value: 437 Ohm
Lead Channel Impedance Value: 513 Ohm
Lead Channel Impedance Value: 608 Ohm
Lead Channel Pacing Threshold Amplitude: 0.75 V
Lead Channel Pacing Threshold Amplitude: 3.75 V
Lead Channel Pacing Threshold Pulse Width: 0.4 ms
Lead Channel Sensing Intrinsic Amplitude: 18.25 mV
Lead Channel Sensing Intrinsic Amplitude: 18.25 mV
Lead Channel Sensing Intrinsic Amplitude: 2.875 mV
Lead Channel Sensing Intrinsic Amplitude: 2.875 mV
Lead Channel Setting Pacing Amplitude: 3 V
Lead Channel Setting Pacing Pulse Width: 0.3 ms
MDC IDC LEAD IMPLANT DT: 20161013
MDC IDC LEAD LOCATION: 753859
MDC IDC MSMT BATTERY REMAINING LONGEVITY: 13 mo
MDC IDC MSMT LEADCHNL LV IMPEDANCE VALUE: 151.406
MDC IDC MSMT LEADCHNL LV IMPEDANCE VALUE: 155.455
MDC IDC MSMT LEADCHNL LV IMPEDANCE VALUE: 162.857
MDC IDC MSMT LEADCHNL LV IMPEDANCE VALUE: 166.114
MDC IDC MSMT LEADCHNL LV IMPEDANCE VALUE: 456 Ohm
MDC IDC MSMT LEADCHNL LV IMPEDANCE VALUE: 513 Ohm
MDC IDC MSMT LEADCHNL LV IMPEDANCE VALUE: 551 Ohm
MDC IDC MSMT LEADCHNL LV IMPEDANCE VALUE: 570 Ohm
MDC IDC MSMT LEADCHNL LV PACING THRESHOLD PULSEWIDTH: 0.8 ms
MDC IDC MSMT LEADCHNL RA PACING THRESHOLD AMPLITUDE: 0.75 V
MDC IDC MSMT LEADCHNL RA PACING THRESHOLD PULSEWIDTH: 0.4 ms
MDC IDC MSMT LEADCHNL RV IMPEDANCE VALUE: 437 Ohm
MDC IDC MSMT LEADCHNL RV IMPEDANCE VALUE: 494 Ohm
MDC IDC PG IMPLANT DT: 20161013
MDC IDC SESS DTM: 20200217093823
MDC IDC SET LEADCHNL LV PACING PULSEWIDTH: 1 ms
MDC IDC SET LEADCHNL RA PACING AMPLITUDE: 1.5 V
MDC IDC SET LEADCHNL RV PACING AMPLITUDE: 2 V
MDC IDC SET LEADCHNL RV SENSING SENSITIVITY: 0.3 mV
MDC IDC STAT BRADY AS VS PERCENT: 1.45 %

## 2018-11-23 NOTE — Progress Notes (Signed)
Remote ICD transmission.   

## 2019-02-13 ENCOUNTER — Ambulatory Visit (INDEPENDENT_AMBULATORY_CARE_PROVIDER_SITE_OTHER): Payer: Medicare Other | Admitting: *Deleted

## 2019-02-13 ENCOUNTER — Other Ambulatory Visit: Payer: Self-pay

## 2019-02-13 DIAGNOSIS — I428 Other cardiomyopathies: Secondary | ICD-10-CM

## 2019-02-14 LAB — CUP PACEART REMOTE DEVICE CHECK
Battery Remaining Longevity: 11 mo
Battery Voltage: 2.87 V
Brady Statistic AP VP Percent: 0.15 %
Brady Statistic AP VS Percent: 0 %
Brady Statistic AS VP Percent: 98.35 %
Brady Statistic AS VS Percent: 1.5 %
Brady Statistic RA Percent Paced: 0.15 %
Brady Statistic RV Percent Paced: 22.86 %
Date Time Interrogation Session: 20200518101606
HighPow Impedance: 80 Ohm
Implantable Lead Implant Date: 20161013
Implantable Lead Implant Date: 20161013
Implantable Lead Implant Date: 20161013
Implantable Lead Location: 753858
Implantable Lead Location: 753859
Implantable Lead Location: 753860
Implantable Lead Model: 4398
Implantable Lead Model: 5076
Implantable Pulse Generator Implant Date: 20161013
Lead Channel Impedance Value: 155.455
Lead Channel Impedance Value: 162.857
Lead Channel Impedance Value: 166.25 Ohm
Lead Channel Impedance Value: 180 Ohm
Lead Channel Impedance Value: 194.634
Lead Channel Impedance Value: 285 Ohm
Lead Channel Impedance Value: 342 Ohm
Lead Channel Impedance Value: 342 Ohm
Lead Channel Impedance Value: 380 Ohm
Lead Channel Impedance Value: 399 Ohm
Lead Channel Impedance Value: 437 Ohm
Lead Channel Impedance Value: 494 Ohm
Lead Channel Impedance Value: 494 Ohm
Lead Channel Impedance Value: 494 Ohm
Lead Channel Impedance Value: 551 Ohm
Lead Channel Impedance Value: 570 Ohm
Lead Channel Impedance Value: 608 Ohm
Lead Channel Impedance Value: 646 Ohm
Lead Channel Pacing Threshold Amplitude: 0.625 V
Lead Channel Pacing Threshold Amplitude: 0.75 V
Lead Channel Pacing Threshold Amplitude: 3.75 V
Lead Channel Pacing Threshold Pulse Width: 0.4 ms
Lead Channel Pacing Threshold Pulse Width: 0.4 ms
Lead Channel Pacing Threshold Pulse Width: 0.8 ms
Lead Channel Sensing Intrinsic Amplitude: 2.75 mV
Lead Channel Sensing Intrinsic Amplitude: 2.75 mV
Lead Channel Sensing Intrinsic Amplitude: 20 mV
Lead Channel Sensing Intrinsic Amplitude: 20 mV
Lead Channel Setting Pacing Amplitude: 1.5 V
Lead Channel Setting Pacing Amplitude: 2 V
Lead Channel Setting Pacing Amplitude: 3 V
Lead Channel Setting Pacing Pulse Width: 0.3 ms
Lead Channel Setting Pacing Pulse Width: 1 ms
Lead Channel Setting Sensing Sensitivity: 0.3 mV

## 2019-02-22 ENCOUNTER — Encounter: Payer: Self-pay | Admitting: Cardiology

## 2019-02-22 NOTE — Progress Notes (Signed)
Remote ICD transmission.   

## 2019-04-11 ENCOUNTER — Other Ambulatory Visit: Payer: Self-pay | Admitting: *Deleted

## 2019-04-11 DIAGNOSIS — Z20822 Contact with and (suspected) exposure to covid-19: Secondary | ICD-10-CM

## 2019-04-16 LAB — NOVEL CORONAVIRUS, NAA: SARS-CoV-2, NAA: NOT DETECTED

## 2019-05-08 ENCOUNTER — Other Ambulatory Visit: Payer: Self-pay

## 2019-05-08 ENCOUNTER — Ambulatory Visit (INDEPENDENT_AMBULATORY_CARE_PROVIDER_SITE_OTHER): Payer: Medicare Other | Admitting: Orthopaedic Surgery

## 2019-05-08 ENCOUNTER — Encounter: Payer: Self-pay | Admitting: Orthopaedic Surgery

## 2019-05-08 ENCOUNTER — Ambulatory Visit: Payer: Self-pay

## 2019-05-08 DIAGNOSIS — M5442 Lumbago with sciatica, left side: Secondary | ICD-10-CM

## 2019-05-08 DIAGNOSIS — M79605 Pain in left leg: Secondary | ICD-10-CM

## 2019-05-08 DIAGNOSIS — G8929 Other chronic pain: Secondary | ICD-10-CM

## 2019-05-08 MED ORDER — METHYLPREDNISOLONE 4 MG PO TABS: ORAL_TABLET | ORAL | 0 refills | Status: DC

## 2019-05-08 NOTE — Progress Notes (Signed)
Office Visit Note   Patient: Nicole Chan           Date of Birth: May 11, 1941           MRN: [id number] Visit Date: 05/08/2019              Requested by: Willey Blade, [address] [place],  [place] [address] PCP: Willey Blade, MD   Assessment & Plan: Visit Diagnoses:  1. Pain in left leg   2. Chronic left-sided low back pain with left-sided sciatica     Plan: I feel that her symptoms are more radicular in nature.  She did let me know that she is been told in the past that she did have degenerative disc disease in the lumbar spine.  I would like to send her to outpatient physical therapy to work on various modalities that can hopefully decrease the radicular symptoms she is having going down her left lower extremity.  I am also going to try a 6-day steroid taper.  All question concerns were answered addressed.  I would like to see her back in 4 weeks to see how she is responded to the medication in the therapy.  I would also like an AP and lateral of the lumbar spine at that visit.  Follow-Up Instructions: Return in about 4 weeks (around 06/05/2019).   Orders:  Orders Placed This Encounter  Procedures   XR Tibia/Fibula Left   Meds ordered this encounter  Medications   methylPREDNISolone (MEDROL) 4 MG tablet    Sig: Medrol dose pack. Take as instructed    Dispense:  21 tablet    Refill:  0      Procedures: No procedures performed   Clinical Data: No additional findings.   Subjective: Chief Complaint  Patient presents with   Left Leg - Pain  The patient is a very pleasant and active 78 year old female who comes in for evaluation treatment of left lateral leg pain is been going on for a long period of time.  She denies any injuries and is not a diabetic.  Is worse in the evenings.  She does wake up in pain as well.  She does have a little bit of pain in her sciatic area.  She denies any change in bowel bladder function.  She denies any  left-sided groin pain or left-sided knee pain.  She takes Advil on occasion but tries avoid this due to high blood pressure.  HPI  Review of Systems She currently denies any headache, chest pain, shortness of breath, fever, chills, nausea, vomiting  Objective: Vital Signs: There were no vitals taken for this visit.  Physical Exam She is alert and orient x3 and in no acute distress Ortho Exam Examination of her left lower extremity shows a normal exam throughout the left lower extremity and the right lower extremity.  She has 5 out of 5 strength in her upper and lower legs.  She has normal sensation all dermatomes and normal reflexes.  Her right and left hips move normally with no pain in the groin.  She has some slight pain over the trochanteric area on her left side. Specialty Comments:  No specialty comments available.  Imaging: Xr Tibia/fibula Left  Result Date: 05/08/2019 2 views of the left tibia and fibula show no acute findings.    PMFS History: Patient Active Problem List   Diagnosis Date Noted   Near syncope 10/15/2015   Hypotension 10/15/2015   NICM (nonischemic cardiomyopathy) ([place]) 10/15/2015  Chronic systolic CHF (congestive heart failure) (HCC) 10/15/2015   Tobacco abuse 10/15/2015   Hyperglycemia 10/15/2015   ICD (implantable cardioverter-defibrillator), biventricular, in situ 10/11/2015   Hypertensive heart disease with CHF (HCC) 06/05/2015   Chronic systolic heart failure (HCC) 11/12/2014   Dizziness 11/12/2014   Tobacco use 10/26/2014   Essential hypertension 10/25/2014   LBBB (left bundle branch block)- new 10/24/2014   Past Medical History:  Diagnosis Date   Arthritis    "maybe in my hands/fingers; not terrible" (07/12/2015)   Chronic systolic CHF (congestive heart failure) (HCC)    Dilated cardiomyopathy (HCC)    Episode of dizziness, secondary to cardiomyopathy possibly  10/25/2014   Essential hypertension    Hypertensive heart  disease    LBBB (left bundle branch block)    a. noted on 10/24/14 admission for dizziness    NICM (nonischemic cardiomyopathy) (HCC)    a. Cath 10/29/14: normal coronary arteries (tortuous, compatible with hypertension). b. s/p Medtronic BiV-ICD 06/2015.   Tobacco abuse     Family History  Problem Relation Age of Onset   Arrhythmia Mother    Hypertension Mother    Diabetes Mother    Heart disease Mother    Cancer Father    Lung cancer Sister    Healthy Paternal Aunt    Heart attack Neg Hx    Stroke Neg Hx     Past Surgical History:  Procedure Laterality Date   ABDOMINAL HYSTERECTOMY  1970's   APPENDECTOMY     BI-VENTRICULAR IMPLANTABLE CARDIOVERTER DEFIBRILLATOR  (CRT-D)  07/11/2015   CARDIAC CATHETERIZATION     EP IMPLANTABLE DEVICE N/A 07/11/2015   Procedure: BiV ICD Insertion CRT-D;  Surgeon: Marinus Maw, MD;  Location: [facility] INVASIVE CV LAB;  Service: Cardiovascular;  Laterality: N/A;   LEFT HEART CATHETERIZATION WITH CORONARY ANGIOGRAM N/A 10/29/2014   Procedure: LEFT HEART CATHETERIZATION WITH CORONARY ANGIOGRAM;  Surgeon: Lesleigh Noe, MD;  Location[facility] C[facility] LAB;  Service: Cardiovascular;  Laterality: N/A;   TONSILLECTOMY     TUBAL LIGATION  1970's   Social History   Occupational History   Not on file  Tobacco Use   Smoking status: Current Every Day Smoker    Packs/day: 0.33    Years: 55.00    Pack years: 18.15    Types: Cigarettes   Smokeless tobacco: Never Used  Substance and Sexual Activity   Alcohol use: No    Alcohol/week: 0.0 standard drinks    Comment: on special occasions   Drug use: No   Sexual activity: Yes

## 2019-05-15 ENCOUNTER — Ambulatory Visit (INDEPENDENT_AMBULATORY_CARE_PROVIDER_SITE_OTHER): Payer: Medicare Other | Admitting: *Deleted

## 2019-05-15 ENCOUNTER — Telehealth: Payer: Self-pay

## 2019-05-15 DIAGNOSIS — I428 Other cardiomyopathies: Secondary | ICD-10-CM | POA: Diagnosis not present

## 2019-05-15 LAB — CUP PACEART REMOTE DEVICE CHECK
Battery Remaining Longevity: 9 mo
Battery Voltage: 2.85 V
Brady Statistic AP VP Percent: 0.36 %
Brady Statistic AP VS Percent: 0.01 %
Brady Statistic AS VP Percent: 98.14 %
Brady Statistic AS VS Percent: 1.49 %
Brady Statistic RA Percent Paced: 0.37 %
Brady Statistic RV Percent Paced: 25.83 %
Date Time Interrogation Session: 20200817062704
HighPow Impedance: 73 Ohm
Implantable Lead Implant Date: 20161013
Implantable Lead Implant Date: 20161013
Implantable Lead Implant Date: 20161013
Implantable Lead Location: 753858
Implantable Lead Location: 753859
Implantable Lead Location: 753860
Implantable Lead Model: 4398
Implantable Lead Model: 5076
Implantable Pulse Generator Implant Date: 20161013
Lead Channel Impedance Value: 155.455
Lead Channel Impedance Value: 172.5 Ohm
Lead Channel Impedance Value: 172.5 Ohm
Lead Channel Impedance Value: 191.854
Lead Channel Impedance Value: 218.5 Ohm
Lead Channel Impedance Value: 285 Ohm
Lead Channel Impedance Value: 342 Ohm
Lead Channel Impedance Value: 437 Ohm
Lead Channel Impedance Value: 437 Ohm
Lead Channel Impedance Value: 437 Ohm
Lead Channel Impedance Value: 513 Ohm
Lead Channel Impedance Value: 551 Ohm
Lead Channel Impedance Value: 551 Ohm
Lead Channel Impedance Value: 570 Ohm
Lead Channel Impedance Value: 608 Ohm
Lead Channel Impedance Value: 665 Ohm
Lead Channel Impedance Value: 703 Ohm
Lead Channel Impedance Value: 722 Ohm
Lead Channel Pacing Threshold Amplitude: 0.625 V
Lead Channel Pacing Threshold Amplitude: 0.75 V
Lead Channel Pacing Threshold Amplitude: 3.75 V
Lead Channel Pacing Threshold Pulse Width: 0.4 ms
Lead Channel Pacing Threshold Pulse Width: 0.4 ms
Lead Channel Pacing Threshold Pulse Width: 0.8 ms
Lead Channel Sensing Intrinsic Amplitude: 15.625 mV
Lead Channel Sensing Intrinsic Amplitude: 15.625 mV
Lead Channel Sensing Intrinsic Amplitude: 2.5 mV
Lead Channel Sensing Intrinsic Amplitude: 2.5 mV
Lead Channel Setting Pacing Amplitude: 1.5 V
Lead Channel Setting Pacing Amplitude: 2 V
Lead Channel Setting Pacing Amplitude: 3 V
Lead Channel Setting Pacing Pulse Width: 0.3 ms
Lead Channel Setting Pacing Pulse Width: 1 ms
Lead Channel Setting Sensing Sensitivity: 0.3 mV

## 2019-05-15 NOTE — Telephone Encounter (Signed)
LMOVM to inform pt of alert for ERI. Will schedule monthly battery checks.

## 2019-05-16 NOTE — Telephone Encounter (Signed)
Spoke w/ pt and informed her that her device is nearing ERI and that we will start following the device monthly. Pt verbalized understanding.

## 2019-05-23 ENCOUNTER — Encounter: Payer: Self-pay | Admitting: Cardiology

## 2019-05-23 NOTE — Progress Notes (Signed)
Remote ICD transmission.   

## 2019-06-08 ENCOUNTER — Ambulatory Visit: Payer: Medicare Other | Admitting: Orthopaedic Surgery

## 2019-06-15 ENCOUNTER — Ambulatory Visit (INDEPENDENT_AMBULATORY_CARE_PROVIDER_SITE_OTHER): Payer: Medicare Other | Admitting: *Deleted

## 2019-06-15 DIAGNOSIS — I428 Other cardiomyopathies: Secondary | ICD-10-CM

## 2019-06-15 LAB — CUP PACEART REMOTE DEVICE CHECK
Battery Remaining Longevity: 8 mo
Battery Voltage: 2.84 V
Brady Statistic AP VP Percent: 0.26 %
Brady Statistic AP VS Percent: 0.01 %
Brady Statistic AS VP Percent: 98.28 %
Brady Statistic AS VS Percent: 1.45 %
Brady Statistic RA Percent Paced: 0.26 %
Brady Statistic RV Percent Paced: 17.25 %
Date Time Interrogation Session: 20200917103528
HighPow Impedance: 84 Ohm
Implantable Lead Implant Date: 20161013
Implantable Lead Implant Date: 20161013
Implantable Lead Implant Date: 20161013
Implantable Lead Location: 753858
Implantable Lead Location: 753859
Implantable Lead Location: 753860
Implantable Lead Model: 4398
Implantable Lead Model: 5076
Implantable Pulse Generator Implant Date: 20161013
Lead Channel Impedance Value: 149.625
Lead Channel Impedance Value: 149.625
Lead Channel Impedance Value: 159.6 Ohm
Lead Channel Impedance Value: 171 Ohm
Lead Channel Impedance Value: 184.154
Lead Channel Impedance Value: 266 Ohm
Lead Channel Impedance Value: 342 Ohm
Lead Channel Impedance Value: 342 Ohm
Lead Channel Impedance Value: 399 Ohm
Lead Channel Impedance Value: 437 Ohm
Lead Channel Impedance Value: 437 Ohm
Lead Channel Impedance Value: 494 Ohm
Lead Channel Impedance Value: 513 Ohm
Lead Channel Impedance Value: 551 Ohm
Lead Channel Impedance Value: 570 Ohm
Lead Channel Impedance Value: 608 Ohm
Lead Channel Impedance Value: 608 Ohm
Lead Channel Impedance Value: 646 Ohm
Lead Channel Pacing Threshold Amplitude: 0.625 V
Lead Channel Pacing Threshold Amplitude: 0.75 V
Lead Channel Pacing Threshold Amplitude: 3.75 V
Lead Channel Pacing Threshold Pulse Width: 0.4 ms
Lead Channel Pacing Threshold Pulse Width: 0.4 ms
Lead Channel Pacing Threshold Pulse Width: 0.8 ms
Lead Channel Sensing Intrinsic Amplitude: 15.625 mV
Lead Channel Sensing Intrinsic Amplitude: 15.625 mV
Lead Channel Sensing Intrinsic Amplitude: 2.25 mV
Lead Channel Sensing Intrinsic Amplitude: 2.25 mV
Lead Channel Setting Pacing Amplitude: 1.5 V
Lead Channel Setting Pacing Amplitude: 2 V
Lead Channel Setting Pacing Amplitude: 3 V
Lead Channel Setting Pacing Pulse Width: 0.3 ms
Lead Channel Setting Pacing Pulse Width: 1 ms
Lead Channel Setting Sensing Sensitivity: 0.3 mV

## 2019-06-19 ENCOUNTER — Encounter: Payer: Self-pay | Admitting: Cardiology

## 2019-06-19 NOTE — Progress Notes (Signed)
Remote ICD transmission.   

## 2019-07-17 ENCOUNTER — Encounter: Payer: Medicare Other | Admitting: *Deleted

## 2019-07-26 ENCOUNTER — Other Ambulatory Visit: Payer: Self-pay | Admitting: Internal Medicine

## 2019-07-26 DIAGNOSIS — E2839 Other primary ovarian failure: Secondary | ICD-10-CM

## 2019-07-28 ENCOUNTER — Other Ambulatory Visit: Payer: Self-pay | Admitting: Internal Medicine

## 2019-07-28 DIAGNOSIS — I119 Hypertensive heart disease without heart failure: Secondary | ICD-10-CM

## 2019-07-29 NOTE — Progress Notes (Signed)
Cardiology Office Note Date:  07/31/2019  Patient ID:  Nicole, Chan 1941/06/18, MRN [id number] PCP:  Willey Blade, MD  Electrophysiologist:  Dr. Lovena Le   Chief Complaint:  annual visit   History of Present Illness: Nicole Chan is a 78 y.o. female with history of HTN, obesity LBBB, NICM, chronic CHF (systolic), ICD.  She comes in today to be seen for Dr. Lovena Le.  Last seen by him 06/2018.  AT that time she was doing well, class II symptoms, no changes were made.  She is doing PT for sciatica pain this is helping.  Outside if this doing quite well.  No CP, palpitations or cardiac awareness.  No near syncope or syncope, no shocks.  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 Arthritis    "maybe in my hands/fingers; not terrible" (07/12/2015)  . Chronic systolic CHF (congestive heart failure) ([place])   . Dilated cardiomyopathy ([place])   . Episode of dizziness, secondary to cardiomyopathy possibly  10/25/2014  . Essential hypertension   . Hypertensive heart disease   . LBBB (left bundle branch block)    a. noted on 10/24/14 admission for dizziness   . NICM (nonischemic cardiomyopathy) ([place])    a. Cath 10/29/14: normal coronary arteries (tortuous, compatible with hypertension). b. s/p Medtronic BiV-ICD 06/2015.  . Tobacco abuse     Past Surgical History:  Procedure Laterality Date  . ABDOMINAL HYSTERECTOMY  1970's  . APPENDECTOMY    . BI-VENTRICULAR IMPLANTABLE CARDIOVERTER DEFIBRILLATOR  (CRT-D)  07/11/2015  . CARDIAC CATHETERIZATION    . EP IMPLANTABLE DEVICE N/A 07/11/2015   Procedure: BiV ICD Insertion CRT-D;  Surgeon: Evans Lance, MD;  Location: [place];  Service: Cardiovascular;  Laterality: N/A;  . LEFT HEART CATHETERIZATION WITH CORONARY ANGIOGRAM N/A 10/29/2014   Procedure: LEFT HEART  CATHETERIZATION WITH CORONARY ANGIOGRAM;  Surgeon: Sinclair Grooms, MD;  Location: [facility] CATH LAB;  Service: Cardiovascular;  Laterality: N/A;  . TONSILLECTOMY    . TUBAL LIGATION  1970's    Current Outpatient Medications  Medication Sig Dispense Refill  . aspirin EC 81 MG EC tablet Take 1 tablet (81 mg total) by mouth daily.    . carvedilol (COREG) 12.5 MG tablet TAKE 1 TABLET BY MOUTH TWICE DAILY...PLEASE KEEP APPT WITH DR/TAYLOR IN OCTOBER 180 tablet 3  . irbesartan-hydrochlorothiazide (AVALIDE) 300-12.5 MG tablet Take 1 tablet by mouth daily. 90 tablet 3  . nitroGLYCERIN (NITROSTAT) 0.4 MG SL tablet Place 1 tablet (0.4 mg total) under the tongue every 5 (five) minutes as needed for chest pain. 25 tablet 4  . potassium chloride SA (K-DUR,KLOR-CON) 20 MEQ tablet Take 1 tablet (20 mEq total) by mouth daily. 30 tablet 11  . Vitamin D, Ergocalciferol, (DRISDOL) 50000 UNITS CAPS capsule Take 50,000 Units by mouth 2 (two) times a week.      No current facility-administered medications for this visit.     Allergies:   Shellfish allergy   Social History:  The patient  reports that she has been smoking cigarettes. She has a 18.15 pack-year smoking history. She has never used smokeless tobacco. She reports that she does not drink alcohol or use drugs.   Family History:  The patient's family history includes Arrhythmia in her mother; Cancer in her father; Diabetes in her mother; Healthy in her paternal  aunt; Heart disease in her mother; Hypertension in her mother; Lung cancer in her sister.  ROS:  Please see the history of present illness.    All other systems are reviewed and otherwise negative.   PHYSICAL EXAM:  VS:  BP (!) 154/84   Pulse 75   Ht 5\' 8"  (1.727 m)   Wt 214 lb (97.1 kg)   BMI 32.54 kg/m  BMI: Body mass index is 32.54 kg/m. Well nourished, well developed, in no acute distress  HEENT: normocephalic, atraumatic  Neck: no JVD, carotid bruits or masses Cardiac:   RRR; no  significant murmurs, no rubs, or gallops Lungs:  CTA b/l, no wheezing, rhonchi or rales  Abd: soft, nontender MS: no deformity or atrophy Ext: no edema  Skin: warm and dry, no rash Neuro:  No gross deficits appreciated Psych: euthymic mood, full affect  ICD site is stable, no tethering or discomfort   EKG:  Done today and reviewed by myself shows A sensed, V paced ICD interrogation done today and reviewed by myself: battery estimate is 47mo No arrhythmias or device detections Lead measurements are stable from previous LV lead threshold is 2.75V (up 1/4V) output is increased to 3.25V to give 1/2 volt safety margin on this lead OptiVol had a crrossing about a month ago, back below threshold currently     06/19/2017: TTE Study Conclusions - Left ventricle: The cavity size was normal. Wall thickness was   increased in a pattern of moderate LVH. Systolic function was   severely reduced. The estimated ejection fraction was in the   range of 25% to 30%. Septal-lateral dyssynchrony with severe   septal hypokinesis. Relative preservation of lateral wall   function. Doppler parameters are consistent with abnormal left   ventricular relaxation (grade 1 diastolic dysfunction). Abnormal   strain pattern, especially in lateral wall. GLS -11%. - Aortic valve: Trileaflet; moderately calcified leaflets. There   was no stenosis. There was mild regurgitation. - Mitral valve: Moderately calcified annulus. Mildly calcified   leaflets . There was no significant regurgitation. - Left atrium: The atrium was at the upper limits of normal in   size. - Right ventricle: The cavity size was normal. Systolic function   was normal. - Tricuspid valve: Peak RV-RA gradient (S): 23 mm Hg. - Pulmonary arteries: PA peak pressure: 26 mm Hg (S). - Inferior vena cava: The vessel was normal in size. The   respirophasic diameter changes were in the normal range (= 50%),   consistent with normal central venous  pressure.  Impressions: - Normal LV size with moderate LV hypertrophy. EF 25-30% with   septal-lateral dyssynchrony and severe septal hypokinesis   (relatively preserved lateral wall function). Normal RV size and   systolic function. Mild AI.    Recent Labs: No results found for requested labs within last 8760 hours.  No results found for requested labs within last 8760 hours.   CrCl cannot be calculated (Patient's most recent lab result is older than the maximum 21 days allowed.).   Wt Readings from Last 3 Encounters:  07/31/19 214 lb (97.1 kg)  07/28/18 230 lb 3.2 oz (104.4 kg)  07/28/17 230 lb (104.3 kg)     Other studies reviewed: Additional studies/records reviewed today include: summarized above  ASSESSMENT AND PLAN:  1. CRT-D     Intact function     Battery est 39mo, on monthly remote battery checks     Will have her back in 4 mo, sooner if needed  2. NICM 3. Chronic CHF (systolic)      On BB/ARB, diuretic     OptiVol looks god     No symptoms or exam findings to suggest volume OL     She reports labs done recently with her PMD  4. HTN     A little high though just took her meds prior to coming in     Disposition: F/u with Dr. Ladona Ridgel in 76mo, sooner if needed, battery nearing ERI  Current medicines are reviewed at length with the patient today.  The patient did not have any concerns regarding medicines.  Norma Fredrickson, PA-C 07/31/2019 1:19 PM     [facility] [address] [place] [address] [phone] (office)  [phone] (fax)

## 2019-07-31 ENCOUNTER — Ambulatory Visit: Payer: Medicare Other | Admitting: Physician Assistant

## 2019-07-31 ENCOUNTER — Other Ambulatory Visit: Payer: Self-pay

## 2019-07-31 VITALS — BP 154/84 | HR 75 | Ht 68.0 in | Wt 214.0 lb

## 2019-07-31 DIAGNOSIS — I428 Other cardiomyopathies: Secondary | ICD-10-CM

## 2019-07-31 DIAGNOSIS — I5022 Chronic systolic (congestive) heart failure: Secondary | ICD-10-CM | POA: Diagnosis not present

## 2019-07-31 DIAGNOSIS — I10 Essential (primary) hypertension: Secondary | ICD-10-CM | POA: Diagnosis not present

## 2019-07-31 DIAGNOSIS — Z95810 Presence of automatic (implantable) cardiac defibrillator: Secondary | ICD-10-CM

## 2019-07-31 NOTE — Patient Instructions (Signed)
Medication Instructions:  Your physician recommends that you continue on your current medications as directed. Please refer to the Current Medication list given to you today.  *If you need a refill on your cardiac medications before your next appointment, please call your pharmacy*  Lab Work: [place]   If you have labs (blood work) drawn today and your tests are completely normal, you will receive your results only by: Marland Kitchen MyChart Message (if you have MyChart) OR . A paper copy in the mail If you have any lab test that is abnormal or we need to change your treatment, we will call you to review the results.  Testing/Procedures: NONE ORDERED  TODAY   Follow-Up: At [facility], you and your health needs are our priority.  As part of our continuing mission to provide you with exceptional heart care, we have created designated Provider Care Teams.  These Care Teams include your primary Cardiologist (physician) and Advanced Practice Providers (APPs -  Physician Assistants and Nurse Practitioners) who all work together to provide you with the care you need, when you need it.  Your next appointment:   4 months  The format for your next appointment:   In Person  Provider:   You may see None Dr. Lovena Le  or one of the following Advanced Practice Providers on your designated Care Team:    Chanetta Marshall, NP  Tommye Standard, PA-C  Legrand Como "Oda Kilts, [place]   Other Instructions

## 2019-08-04 ENCOUNTER — Ambulatory Visit
Admission: RE | Admit: 2019-08-04 | Discharge: 2019-08-04 | Disposition: A | Discharge status: Home / Self Care | Payer: Medicare Other | Source: Ambulatory Visit | Attending: Internal Medicine | Admitting: Internal Medicine

## 2019-08-04 DIAGNOSIS — I119 Hypertensive heart disease without heart failure: Secondary | ICD-10-CM

## 2019-08-17 ENCOUNTER — Encounter: Payer: Medicare Other | Admitting: *Deleted

## 2019-09-18 ENCOUNTER — Ambulatory Visit (INDEPENDENT_AMBULATORY_CARE_PROVIDER_SITE_OTHER): Payer: Medicare Other | Admitting: *Deleted

## 2019-09-18 DIAGNOSIS — Z95810 Presence of automatic (implantable) cardiac defibrillator: Secondary | ICD-10-CM

## 2019-09-18 LAB — CUP PACEART REMOTE DEVICE CHECK
Battery Remaining Longevity: 5 mo
Battery Voltage: 2.81 V
Brady Statistic AP VP Percent: 0.12 %
Brady Statistic AP VS Percent: 0 %
Brady Statistic AS VP Percent: 98.36 %
Brady Statistic AS VS Percent: 1.51 %
Brady Statistic RA Percent Paced: 0.13 %
Brady Statistic RV Percent Paced: 19.1 %
Date Time Interrogation Session: 20201221044226
HighPow Impedance: 78 Ohm
Implantable Lead Implant Date: 20161013
Implantable Lead Implant Date: 20161013
Implantable Lead Implant Date: 20161013
Implantable Lead Location: 753858
Implantable Lead Location: 753859
Implantable Lead Location: 753860
Implantable Lead Model: 4398
Implantable Lead Model: 5076
Implantable Pulse Generator Implant Date: 20161013
Lead Channel Impedance Value: 149.625
Lead Channel Impedance Value: 149.625
Lead Channel Impedance Value: 159.6 Ohm
Lead Channel Impedance Value: 171 Ohm
Lead Channel Impedance Value: 184.154
Lead Channel Impedance Value: 266 Ohm
Lead Channel Impedance Value: 342 Ohm
Lead Channel Impedance Value: 342 Ohm
Lead Channel Impedance Value: 399 Ohm
Lead Channel Impedance Value: 437 Ohm
Lead Channel Impedance Value: 437 Ohm
Lead Channel Impedance Value: 513 Ohm
Lead Channel Impedance Value: 513 Ohm
Lead Channel Impedance Value: 513 Ohm
Lead Channel Impedance Value: 551 Ohm
Lead Channel Impedance Value: 551 Ohm
Lead Channel Impedance Value: 608 Ohm
Lead Channel Impedance Value: 646 Ohm
Lead Channel Pacing Threshold Amplitude: 0.75 V
Lead Channel Pacing Threshold Amplitude: 0.75 V
Lead Channel Pacing Threshold Amplitude: 3.75 V
Lead Channel Pacing Threshold Pulse Width: 0.4 ms
Lead Channel Pacing Threshold Pulse Width: 0.4 ms
Lead Channel Pacing Threshold Pulse Width: 0.8 ms
Lead Channel Sensing Intrinsic Amplitude: 2.5 mV
Lead Channel Sensing Intrinsic Amplitude: 2.5 mV
Lead Channel Sensing Intrinsic Amplitude: 25 mV
Lead Channel Sensing Intrinsic Amplitude: 25 mV
Lead Channel Setting Pacing Amplitude: 1.5 V
Lead Channel Setting Pacing Amplitude: 2 V
Lead Channel Setting Pacing Amplitude: 3.25 V
Lead Channel Setting Pacing Pulse Width: 0.3 ms
Lead Channel Setting Pacing Pulse Width: 1 ms
Lead Channel Setting Sensing Sensitivity: 0.3 mV

## 2019-10-19 ENCOUNTER — Ambulatory Visit (INDEPENDENT_AMBULATORY_CARE_PROVIDER_SITE_OTHER): Payer: Medicare Other | Admitting: *Deleted

## 2019-10-19 DIAGNOSIS — Z95810 Presence of automatic (implantable) cardiac defibrillator: Secondary | ICD-10-CM | POA: Diagnosis not present

## 2019-10-19 LAB — CUP PACEART REMOTE DEVICE CHECK
Battery Remaining Longevity: 4 mo
Battery Voltage: 2.8 V
Brady Statistic AP VP Percent: 0.04 %
Brady Statistic AP VS Percent: 0 %
Brady Statistic AS VP Percent: 98.51 %
Brady Statistic AS VS Percent: 1.44 %
Brady Statistic RA Percent Paced: 0.04 %
Brady Statistic RV Percent Paced: 19.5 %
Date Time Interrogation Session: 20210121012502
HighPow Impedance: 68 Ohm
Implantable Lead Implant Date: 20161013
Implantable Lead Implant Date: 20161013
Implantable Lead Implant Date: 20161013
Implantable Lead Location: 753858
Implantable Lead Location: 753859
Implantable Lead Location: 753860
Implantable Lead Model: 4398
Implantable Lead Model: 5076
Implantable Pulse Generator Implant Date: 20161013
Lead Channel Impedance Value: 155.455
Lead Channel Impedance Value: 162.857
Lead Channel Impedance Value: 172.5 Ohm
Lead Channel Impedance Value: 180 Ohm
Lead Channel Impedance Value: 203.256
Lead Channel Impedance Value: 285 Ohm
Lead Channel Impedance Value: 342 Ohm
Lead Channel Impedance Value: 380 Ohm
Lead Channel Impedance Value: 399 Ohm
Lead Channel Impedance Value: 437 Ohm
Lead Channel Impedance Value: 437 Ohm
Lead Channel Impedance Value: 513 Ohm
Lead Channel Impedance Value: 513 Ohm
Lead Channel Impedance Value: 551 Ohm
Lead Channel Impedance Value: 570 Ohm
Lead Channel Impedance Value: 570 Ohm
Lead Channel Impedance Value: 608 Ohm
Lead Channel Impedance Value: 703 Ohm
Lead Channel Pacing Threshold Amplitude: 0.75 V
Lead Channel Pacing Threshold Amplitude: 0.75 V
Lead Channel Pacing Threshold Amplitude: 3.75 V
Lead Channel Pacing Threshold Pulse Width: 0.4 ms
Lead Channel Pacing Threshold Pulse Width: 0.4 ms
Lead Channel Pacing Threshold Pulse Width: 0.8 ms
Lead Channel Sensing Intrinsic Amplitude: 2.25 mV
Lead Channel Sensing Intrinsic Amplitude: 2.25 mV
Lead Channel Sensing Intrinsic Amplitude: 22.375 mV
Lead Channel Sensing Intrinsic Amplitude: 22.375 mV
Lead Channel Setting Pacing Amplitude: 1.5 V
Lead Channel Setting Pacing Amplitude: 2 V
Lead Channel Setting Pacing Amplitude: 3.25 V
Lead Channel Setting Pacing Pulse Width: 0.3 ms
Lead Channel Setting Pacing Pulse Width: 1 ms
Lead Channel Setting Sensing Sensitivity: 0.3 mV

## 2019-11-20 ENCOUNTER — Ambulatory Visit (INDEPENDENT_AMBULATORY_CARE_PROVIDER_SITE_OTHER): Payer: Medicare Other | Admitting: *Deleted

## 2019-11-20 DIAGNOSIS — Z95810 Presence of automatic (implantable) cardiac defibrillator: Secondary | ICD-10-CM | POA: Diagnosis not present

## 2019-11-20 LAB — CUP PACEART REMOTE DEVICE CHECK
Battery Remaining Longevity: 4 mo
Battery Voltage: 2.78 V
Brady Statistic AP VP Percent: 0.02 %
Brady Statistic AP VS Percent: 0 %
Brady Statistic AS VP Percent: 98.52 %
Brady Statistic AS VS Percent: 1.46 %
Brady Statistic RA Percent Paced: 0.02 %
Brady Statistic RV Percent Paced: 21.72 %
Date Time Interrogation Session: 20210222042206
HighPow Impedance: 71 Ohm
Implantable Lead Implant Date: 20161013
Implantable Lead Implant Date: 20161013
Implantable Lead Implant Date: 20161013
Implantable Lead Location: 753858
Implantable Lead Location: 753859
Implantable Lead Location: 753860
Implantable Lead Model: 4398
Implantable Lead Model: 5076
Implantable Pulse Generator Implant Date: 20161013
Lead Channel Impedance Value: 155.455
Lead Channel Impedance Value: 155.455
Lead Channel Impedance Value: 166.25 Ohm
Lead Channel Impedance Value: 171 Ohm
Lead Channel Impedance Value: 184.154
Lead Channel Impedance Value: 285 Ohm
Lead Channel Impedance Value: 342 Ohm
Lead Channel Impedance Value: 342 Ohm
Lead Channel Impedance Value: 399 Ohm
Lead Channel Impedance Value: 399 Ohm
Lead Channel Impedance Value: 437 Ohm
Lead Channel Impedance Value: 494 Ohm
Lead Channel Impedance Value: 513 Ohm
Lead Channel Impedance Value: 551 Ohm
Lead Channel Impedance Value: 551 Ohm
Lead Channel Impedance Value: 570 Ohm
Lead Channel Impedance Value: 608 Ohm
Lead Channel Impedance Value: 646 Ohm
Lead Channel Pacing Threshold Amplitude: 0.625 V
Lead Channel Pacing Threshold Amplitude: 0.75 V
Lead Channel Pacing Threshold Amplitude: 3.75 V
Lead Channel Pacing Threshold Pulse Width: 0.4 ms
Lead Channel Pacing Threshold Pulse Width: 0.4 ms
Lead Channel Pacing Threshold Pulse Width: 0.8 ms
Lead Channel Sensing Intrinsic Amplitude: 2.25 mV
Lead Channel Sensing Intrinsic Amplitude: 2.25 mV
Lead Channel Sensing Intrinsic Amplitude: 23.375 mV
Lead Channel Sensing Intrinsic Amplitude: 23.375 mV
Lead Channel Setting Pacing Amplitude: 1.5 V
Lead Channel Setting Pacing Amplitude: 2 V
Lead Channel Setting Pacing Amplitude: 3.25 V
Lead Channel Setting Pacing Pulse Width: 0.3 ms
Lead Channel Setting Pacing Pulse Width: 1 ms
Lead Channel Setting Sensing Sensitivity: 0.3 mV

## 2019-11-20 NOTE — Progress Notes (Signed)
ICD Remote  

## 2019-12-02 NOTE — Progress Notes (Signed)
Cardiology Office Note Date:  12/02/2019  Patient ID:  Nicole Chan 1941/08/27, MRN [id number] PCP:  Andi Devon, MD  Electrophysiologist:  Dr. Ladona Ridgel   Chief Complaint:  annual visit   History of Present Illness: Nicole Chan is a 79 y.o. female with history of HTN, obesity LBBB, NICM, chronic CHF (systolic), ICD.  She comes in today to be seen for Dr. Ladona Ridgel.  Last seen by him 06/2018.  AT that time she was doing well, class II symptoms, no changes were made.  I saw her Nov 2020, she was doing PT for sciatica pain that was helping.  Outside if this doing quite well.  No CP, palpitations or cardiac awareness.  No near syncope or syncope, no shocks.  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  She continues to do very well, has no complaints.  Her husband recently hospitalized, though on th emend. No CP, palpitations or cardiac awareness, no dizzy spells, near syncope or syncope. No shocks    Device information MDT CRT-D, implanted 07/11/15   Past Medical History:  Diagnosis Date  . Arthritis    "maybe in my hands/fingers; not terrible" (07/12/2015)  . Chronic systolic CHF (congestive heart failure) ([facility])   . Dilated cardiomyopathy ([facility])   . Episode of dizziness, secondary to cardiomyopathy possibly  10/25/2014  . Essential hypertension   . Hypertensive heart disease   . LBBB (left bundle branch block)    a. noted on 10/24/14 admission for dizziness   . NICM (nonischemic cardiomyopathy) ([facility])    a. Cath 10/29/14: normal coronary arteries (tortuous, compatible with hypertension). b. s/p Medtronic BiV-ICD 06/2015.  . Tobacco abuse     Past Surgical History:  Procedure Laterality Date  . ABDOMINAL HYSTERECTOMY  1970's  . APPENDECTOMY    .  BI-VENTRICULAR IMPLANTABLE CARDIOVERTER DEFIBRILLATOR  (CRT-D)  07/11/2015  . CARDIAC CATHETERIZATION    . EP IMPLANTABLE DEVICE N/A 07/11/2015   Procedure: BiV ICD Insertion CRT-D;  Surgeon: Marinus Maw, MD;  Location: [facility] INV[facility];  Service: Cardiovascular;  Laterality: N/A;  . LEFT HEART CATHETERIZATION WITH CORONARY ANGIOGRAM N/A 10/29/2014   Procedure: LEFT HEART CATHETERIZATION WITH CORONARY ANGIOGRAM;  Surgeon: Lesleigh Noe, MD;  Location: [facility] CATH LAB;  Service: Cardiovascular;  Laterality: N/A;  . TONSILLECTOMY    . TUBAL LIGATION  1970's    Current Outpatient Medications  Medication Sig Dispense Refill  . aspirin EC 81 MG EC tablet Take 1 tablet (81 mg total) by mouth daily.    . carvedilol (COREG) 12.5 MG tablet TAKE 1 TABLET BY MOUTH TWICE DAILY...PLEASE KEEP APPT WITH DR/TAYLOR IN OCTOBER 180 tablet 3  . irbesartan-hydrochlorothiazide (AVALIDE) 300-12.5 MG tablet Take 1 tablet by mouth daily. 90 tablet 3  . nitroGLYCERIN (NITROSTAT) 0.4 MG SL tablet Place 1 tablet (0.4 mg total) under the tongue every 5 (five) minutes as needed for chest pain. 25 tablet 4  . potassium chloride SA (K-DUR,KLOR-CON) 20 MEQ tablet Take 1 tablet (20 mEq total) by mouth daily. 30 tablet 11  . Vitamin D, Ergocalciferol, (DRISDOL) 50000 UNITS CAPS capsule Take 50,000 Units by mouth 2 (two) times a week.      No current facility-administered medications for this visit.    Allergies:   Shellfish allergy  Social History:  The patient  reports that she has been smoking cigarettes. She has a 18.15 pack-year smoking history. She has never used smokeless tobacco. She reports that she does not drink alcohol or use drugs.   Family History:  The patient's family history includes Arrhythmia in her mother; Cancer in her father; Diabetes in her mother; Healthy in her paternal aunt; Heart disease in her mother; Hypertension in her mother; Lung cancer in her sister.  ROS:  Please see the history of  present illness.    All other systems are reviewed and otherwise negative.   PHYSICAL EXAM:  VS:  There were no vitals taken for this visit. BMI: There is no height or weight on file to calculate BMI. Well nourished, well developed, in no acute distress  HEENT: normocephalic, atraumatic  Neck: no JVD, carotid bruits or masses Cardiac:   RRR; no significant murmurs, no rubs, or gallops Lungs:  CTA b/l, no wheezing, rhonchi or rales  Abd: soft, nontender MS: no deformity or atrophy Ext: no edema  Skin: warm and dry, no rash Neuro:  No gross deficits appreciated Psych: euthymic mood, full affect  ICD site is stable, no tethering or discomfort   EKG:  Not done today  ICD interrogation done today and reviewed by myself:  Battery estimate is 3 mo Lead measurements are stable No arrhytymias    06/19/2017: TTE Study Conclusions - Left ventricle: The cavity size was normal. Wall thickness was   increased in a pattern of moderate LVH. Systolic function was   severely reduced. The estimated ejection fraction was in the   range of 25% to 30%. Septal-lateral dyssynchrony with severe   septal hypokinesis. Relative preservation of lateral wall   function. Doppler parameters are consistent with abnormal left   ventricular relaxation (grade 1 diastolic dysfunction). Abnormal   strain pattern, especially in lateral wall. GLS -11%. - Aortic valve: Trileaflet; moderately calcified leaflets. There   was no stenosis. There was mild regurgitation. - Mitral valve: Moderately calcified annulus. Mildly calcified   leaflets . There was no significant regurgitation. - Left atrium: The atrium was at the upper limits of normal in   size. - Right ventricle: The cavity size was normal. Systolic function   was normal. - Tricuspid valve: Peak RV-RA gradient (S): 23 mm Hg. - Pulmonary arteries: PA peak pressure: 26 mm Hg (S). - Inferior vena cava: The vessel was normal in size. The   respirophasic  diameter changes were in the normal range (= 50%),   consistent with normal central venous pressure.  Impressions: - Normal LV size with moderate LV hypertrophy. EF 25-30% with   septal-lateral dyssynchrony and severe septal hypokinesis   (relatively preserved lateral wall function). Normal RV size and   systolic function. Mild AI.    Recent Labs: No results found for requested labs within last 8760 hours.  No results found for requested labs within last 8760 hours.   CrCl cannot be calculated (Patient's most recent lab result is older than the maximum 21 days allowed.).   Wt Readings from Last 3 Encounters:  07/31/19 214 lb (97.1 kg)  07/28/18 230 lb 3.2 oz (104.4 kg)  07/28/17 230 lb (104.3 kg)     Other studies reviewed: Additional studies/records reviewed today include: summarized above  ASSESSMENT AND PLAN:  1. CRT-D     Intact function     Battery estimate is 3 mo  We discussed generator change procedure today, potential risks and benefots She is  willing to proceed when the time comes. She has a cruise in Sept, hopefully will get done by then, I suspect it will be She does not need to come in once ERI is reached, she is OK get scheduled when the time comes, she will need pre-ops then  2. NICM 3. Chronic CHF (systolic)     On BB/ARB, diuretic     OptiVol looks god     No symptoms or exam findings to suggest volume OL       4. HTN     Looks good, no changes     Disposition: monthly battery checks (she is scheduled) tentatively  schedule Dr. Lovena Le for 4 months, though suspect she will be post gen change then and will adjust as needed.  Current medicines are reviewed at length with the patient today.  The patient did not have any concerns regarding medicines.  Venetia Night, PA-C 12/02/2019 10:04 AM     [facility] [place] [address] [phone] (office)  [phone] (fax)

## 2019-12-04 ENCOUNTER — Ambulatory Visit (INDEPENDENT_AMBULATORY_CARE_PROVIDER_SITE_OTHER): Payer: Medicare Other | Admitting: Physician Assistant

## 2019-12-04 ENCOUNTER — Other Ambulatory Visit: Payer: Self-pay

## 2019-12-04 VITALS — BP 124/72 | HR 81 | Ht 68.0 in | Wt 212.0 lb

## 2019-12-04 DIAGNOSIS — I10 Essential (primary) hypertension: Secondary | ICD-10-CM | POA: Diagnosis not present

## 2019-12-04 DIAGNOSIS — I428 Other cardiomyopathies: Secondary | ICD-10-CM | POA: Diagnosis not present

## 2019-12-04 DIAGNOSIS — Z95810 Presence of automatic (implantable) cardiac defibrillator: Secondary | ICD-10-CM

## 2019-12-04 DIAGNOSIS — I5022 Chronic systolic (congestive) heart failure: Secondary | ICD-10-CM

## 2019-12-04 NOTE — Patient Instructions (Addendum)
Medication Instructions:   Your physician recommends that you continue on your current medications as directed. Please refer to the Current Medication list given to you today.   *If you need a refill on your cardiac medications before your next appointment, please call your pharmacy*   Lab Work: NONE ORDERED  TODAY    If you have labs (blood work) drawn today and your tests are completely normal, you will receive your results only by: . MyChart Message (if you have MyChart) OR . A paper copy in the mail If you have any lab test that is abnormal or we need to change your treatment, we will call you to review the results.   Testing/Procedures: NONE ORDERED  TODAY   Follow-Up: At [facility], you and your health needs are our priority.  As part of our continuing mission to provide you with exceptional heart care, we have created designated Provider Care Teams.  These Care Teams include your primary Cardiologist (physician) and Advanced Practice Providers (APPs -  Physician Assistants and Nurse Practitioners) who all work together to provide you with the care you need, when you need it.  We recommend signing up for the patient portal called "MyChart".  Sign up information is provided on this After Visit Summary.  MyChart is used to connect with patients for Virtual Visits (Telemedicine).  Patients are able to view lab/test results, encounter notes, upcoming appointments, etc.  Non-urgent messages can be sent to your provider as well.   To learn more about what you can do with MyChart, go to [email or web address]://[email or web address].com.    Your next appointment:   4 month(s)  The format for your next appointment:   In Person  Provider:   You may see Dr. Taylor  or one of the following Advanced Practice Providers on your designated Care Team:    Amber Seiler, NP  Renee Ursuy, PA-C  Michael "Andy" Tillery, PA-C    Other Instructions   

## 2019-12-12 ENCOUNTER — Other Ambulatory Visit: Payer: Self-pay | Admitting: Internal Medicine

## 2019-12-21 ENCOUNTER — Ambulatory Visit (INDEPENDENT_AMBULATORY_CARE_PROVIDER_SITE_OTHER): Payer: Medicare Other | Admitting: *Deleted

## 2019-12-21 DIAGNOSIS — Z95810 Presence of automatic (implantable) cardiac defibrillator: Secondary | ICD-10-CM | POA: Diagnosis not present

## 2019-12-21 LAB — CUP PACEART REMOTE DEVICE CHECK
Battery Remaining Longevity: 3 mo
Battery Voltage: 2.76 V
Brady Statistic AP VP Percent: 0.02 %
Brady Statistic AP VS Percent: 0 %
Brady Statistic AS VP Percent: 98.51 %
Brady Statistic AS VS Percent: 1.47 %
Brady Statistic RA Percent Paced: 0.02 %
Brady Statistic RV Percent Paced: 24.46 %
Date Time Interrogation Session: 20210325022603
HighPow Impedance: 78 Ohm
Implantable Lead Implant Date: 20161013
Implantable Lead Implant Date: 20161013
Implantable Lead Implant Date: 20161013
Implantable Lead Location: 753858
Implantable Lead Location: 753859
Implantable Lead Location: 753860
Implantable Lead Model: 4398
Implantable Lead Model: 5076
Implantable Pulse Generator Implant Date: 20161013
Lead Channel Impedance Value: 149.625
Lead Channel Impedance Value: 156.471
Lead Channel Impedance Value: 165.351
Lead Channel Impedance Value: 180 Ohm
Lead Channel Impedance Value: 203.256
Lead Channel Impedance Value: 266 Ohm
Lead Channel Impedance Value: 342 Ohm
Lead Channel Impedance Value: 380 Ohm
Lead Channel Impedance Value: 380 Ohm
Lead Channel Impedance Value: 380 Ohm
Lead Channel Impedance Value: 437 Ohm
Lead Channel Impedance Value: 456 Ohm
Lead Channel Impedance Value: 551 Ohm
Lead Channel Impedance Value: 551 Ohm
Lead Channel Impedance Value: 608 Ohm
Lead Channel Impedance Value: 608 Ohm
Lead Channel Impedance Value: 646 Ohm
Lead Channel Impedance Value: 722 Ohm
Lead Channel Pacing Threshold Amplitude: 0.625 V
Lead Channel Pacing Threshold Amplitude: 0.75 V
Lead Channel Pacing Threshold Amplitude: 3.75 V
Lead Channel Pacing Threshold Pulse Width: 0.4 ms
Lead Channel Pacing Threshold Pulse Width: 0.4 ms
Lead Channel Pacing Threshold Pulse Width: 0.8 ms
Lead Channel Sensing Intrinsic Amplitude: 21.625 mV
Lead Channel Sensing Intrinsic Amplitude: 21.625 mV
Lead Channel Sensing Intrinsic Amplitude: 3 mV
Lead Channel Sensing Intrinsic Amplitude: 3 mV
Lead Channel Setting Pacing Amplitude: 1.5 V
Lead Channel Setting Pacing Amplitude: 2 V
Lead Channel Setting Pacing Amplitude: 3.25 V
Lead Channel Setting Pacing Pulse Width: 0.3 ms
Lead Channel Setting Pacing Pulse Width: 1 ms
Lead Channel Setting Sensing Sensitivity: 0.3 mV

## 2020-01-02 ENCOUNTER — Ambulatory Visit (HOSPITAL_COMMUNITY)
Admission: EM | Admit: 2020-01-02 | Discharge: 2020-01-02 | Disposition: A | Discharge status: Home / Self Care | Payer: Medicare Other | Attending: Physician Assistant | Admitting: Physician Assistant

## 2020-01-02 ENCOUNTER — Other Ambulatory Visit: Payer: Self-pay

## 2020-01-02 ENCOUNTER — Encounter (HOSPITAL_COMMUNITY): Payer: Self-pay

## 2020-01-02 DIAGNOSIS — W57XXXA Bitten or stung by nonvenomous insect and other nonvenomous arthropods, initial encounter: Secondary | ICD-10-CM | POA: Diagnosis not present

## 2020-01-02 DIAGNOSIS — T7840XA Allergy, unspecified, initial encounter: Secondary | ICD-10-CM | POA: Diagnosis not present

## 2020-01-02 MED ORDER — DIPHENHYDRAMINE-ZINC ACETATE 2-0.1 % EX CREA: 1.0000 "application " | TOPICAL_CREAM | Freq: Three times a day (TID) | CUTANEOUS | 0 refills | Status: DC | PRN

## 2020-01-02 MED ORDER — TRIAMCINOLONE ACETONIDE 0.1 % EX CREA: 1.0000 "application " | TOPICAL_CREAM | Freq: Two times a day (BID) | CUTANEOUS | 0 refills | Status: DC

## 2020-01-02 NOTE — ED Provider Notes (Signed)
[facility]    CSN: [id number] Arrival date & time: 01/02/20  1028      History   Chief Complaint Chief Complaint  Patient presents with  . Insect Bite    HPI Nicole Chan is a 79 y.o. female.   Patient reported to urgent care for evaluation of bug bites.  She also report swelling in her finger location at a location of bite and has been unable to remove her ring.  She reports the bites are itchy.  She noticed these last night.  She notes bite on her finger, forearm and the back of her neck.  Denies any difficulty breathing or wheezing.     Past Medical History:  Diagnosis Date  . Arthritis    "maybe in my hands/fingers; not terrible" (07/12/2015)  . Chronic systolic CHF (congestive heart failure) (HCC)   . Dilated cardiomyopathy (HCC)   . Episode of dizziness, secondary to cardiomyopathy possibly  10/25/2014  . Essential hypertension   . Hypertensive heart disease   . LBBB (left bundle branch block)    a. noted on 10/24/14 admission for dizziness   . NICM (nonischemic cardiomyopathy) (HCC)    a. Cath 10/29/14: normal coronary arteries (tortuous, compatible with hypertension). b. s/p Medtronic BiV-ICD 06/2015.  . Tobacco abuse     Patient Active Problem List   Diagnosis Date Noted  . Near syncope 10/15/2015  . Hypotension 10/15/2015  . NICM (nonischemic cardiomyopathy) (HCC) 10/15/2015  . Chronic systolic CHF (congestive heart failure) (HCC) 10/15/2015  . Tobacco abuse 10/15/2015  . Hyperglycemia 10/15/2015  . ICD (implantable cardioverter-defibrillator), biventricular, in situ 10/11/2015  . Hypertensive heart disease with CHF (HCC) 06/05/2015  . Chronic systolic heart failure (HCC) 11/12/2014  . Dizziness 11/12/2014  . Tobacco use 10/26/2014  . Essential hypertension 10/25/2014  . LBBB (left bundle branch block)- new 10/24/2014    Past Surgical History:  Procedure Laterality Date  . ABDOMINAL HYSTERECTOMY  1970's  . APPENDECTOMY    .  BI-VENTRICULAR IMPLANTABLE CARDIOVERTER DEFIBRILLATOR  (CRT-D)  07/11/2015  . CARDIAC CATHETERIZATION    . EP IMPLANTABLE DEVICE N/A 07/11/2015   Procedure: BiV ICD Insertion CRT-D;  Surgeon: Marinus Maw, MD;  Location: [facility] INVASIVE CV LAB;  Service: Cardiovascular;  Laterality: N/A;  . LEFT HEART CATHETERIZATION WITH CORONARY ANGIOGRAM N/A 10/29/2014   Procedure: LEFT HEART CATHETERIZATION WITH CORONARY ANGIOGRAM;  Surgeon: Lesleigh Noe, MD;  Location: [facility] CATH LAB;  Service: Cardiovascular;  Laterality: N/A;  . TONSILLECTOMY    . TUBAL LIGATION  1970's    OB History   No obstetric history on file.      Home Medications    Prior to Admission medications   Medication Sig Start Date End Date Taking? Authorizing Provider  aspirin EC 81 MG EC tablet Take 1 tablet (81 mg total) by mouth daily. 10/26/14   Leone Brand, NP  carvedilol (COREG) 12.5 MG tablet TAKE 1 TABLET BY MOUTH TWICE DAILY.Marland Kitchen PLEASE KEEP APPT WITH DR/TAYLOR IN OCTOBER 12/12/19   Marinus Maw, MD  diphenhydrAMINE-zinc acetate (BENADRYL EXTRA STRENGTH) cream Apply 1 application topically 3 (three) times daily as needed for itching. 01/02/20   Jochebed Bills, Veryl Speak, PA-C  irbesartan-hydrochlorothiazide (AVALIDE) 300-12.5 MG tablet Take 1 tablet by mouth daily. 10/11/17   Janetta Hora, PA-C  potassium chloride SA (K-DUR,KLOR-CON) 20 MEQ tablet Take 1 tablet (20 mEq total) by mouth daily. 06/13/15   Lyn Records, MD  triamcinolone cream (KENALOG) 0.1 % Apply 1  application topically 2 (two) times daily. 01/02/20   Allea Kassner, Veryl Speak, PA-C  Vitamin D, Ergocalciferol, (DRISDOL) 50000 UNITS CAPS capsule Take 50,000 Units by mouth 2 (two) times a week.     [provider]    Family History Family History  Problem Relation Age of Onset  . Arrhythmia Mother   . Hypertension Mother   . Diabetes Mother   . Heart disease Mother   . Cancer Father   . Lung cancer Sister   . Healthy Paternal Aunt   . Heart attack Neg Hx   .  Stroke Neg Hx     Social History Social History   Tobacco Use  . Smoking status: Current Every Day Smoker    Packs/day: 0.33    Years: 55.00    Pack years: 18.15    Types: Cigarettes  . Smokeless tobacco: Never Used  Substance Use Topics  . Alcohol use: No    Alcohol/week: 0.0 standard drinks    Comment: on special occasions  . Drug use: No     Allergies   Shellfish allergy   Review of Systems Review of Systems  Constitutional: Negative for fever.  Respiratory: Negative for cough, chest tightness, shortness of breath and wheezing.   Cardiovascular: Negative for chest pain.  Skin: Positive for color change and rash.     Physical Exam Triage Vital Signs ED Triage Vitals  Enc Vitals Group     BP 01/02/20 1109 137/82     Pulse Rate 01/02/20 1109 94     Resp 01/02/20 1109 18     Temp 01/02/20 1109 98 F (36.7 C)     Temp Source 01/02/20 1109 Oral     SpO2 01/02/20 1109 100 %     Weight --      Height --      Head Circumference --      Peak Flow --      Pain Score 01/02/20 1108 0     Pain Loc --      Pain Edu? --      Excl. in GC? --    No data found.  Updated Vital Signs BP 137/82 (BP Location: Right Arm)   Pulse 94   Temp 98 F (36.7 C) (Oral)   Resp 18   SpO2 100%   Visual Acuity Right Eye Distance:   Left Eye Distance:   Bilateral Distance:    Right Eye Near:   Left Eye Near:    Bilateral Near:     Physical Exam Vitals and nursing note reviewed.  Constitutional:      General: She is not in acute distress.    Appearance: She is well-developed. She is not ill-appearing.  HENT:     Head: Normocephalic and atraumatic.  Eyes:     Conjunctiva/sclera: Conjunctivae normal.  Cardiovascular:     Rate and Rhythm: Normal rate.  Pulmonary:     Effort: Pulmonary effort is normal. No respiratory distress.  Musculoskeletal:     Cervical back: Neck supple.  Skin:    General: Skin is warm and dry.     Comments: Left ring finger with swelling  distal to wedding band.  Unable to remove manually.  Post ring cut swelling does reduce slightly.  There are 3 erythematous raised papule/urticarial lesions in total.  One on left ring finger, 1 on left forearm and one on back of neck.  No evidence of cellulitis or significant swelling in other locations.  Neurological:  General: No focal deficit present.     Mental Status: She is alert and oriented to person, place, and time.      UC Treatments / Results  Labs (all labs ordered are listed, but only abnormal results are displayed) Labs Reviewed - No data to display  EKG   Radiology No results found.  Procedures Procedures (including critical care time)  Medications Ordered in UC Medications - No data to display  Initial Impression / Assessment and Plan / UC Course  I have reviewed the triage vital signs and the nursing notes.  Pertinent labs & imaging results that were available during my care of the patient were reviewed by me and considered in my medical decision making (see chart for details).     #Insect bite #Allergic reaction Patient is 79 year old no presenting with insect bites with local allergic reactions.  There was enough swelling the ring finger to disallow removal.  This was cut off due to concern for continued swelling.  Patient been given topical triamcinolone for locations of itching.  To follow-up if not improving.   Final Clinical Impressions(s) / UC Diagnoses   Final diagnoses:  Insect bite, unspecified site, initial encounter  Allergic reaction, initial encounter     Discharge Instructions     I think you will have good response with a topical cream for itch as opposed to oral steroids, as there are risks with steroids. As well I believe the finger swelling will diminish now that we removed the ring.  The cream is stronger than over the counter. Apply this twice a day.   If you have zero improvement or worsening swelling, developing pain or  fever please return.      ED Prescriptions    Medication Sig Dispense Auth. Provider   triamcinolone cream (KENALOG) 0.1 % Apply 1 application topically 2 (two) times daily. 30 g Roman Dubuc, Marguerita Beards, PA-C   diphenhydrAMINE-zinc acetate (BENADRYL EXTRA STRENGTH) cream Apply 1 application topically 3 (three) times daily as needed for itching. 28.4 g Joye Wesenberg, Marguerita Beards, PA-C     PDMP not reviewed this encounter.   Purnell Shoemaker, PA-C 01/02/20 1907

## 2020-01-02 NOTE — Discharge Instructions (Addendum)
I think you will have good response with a topical cream for itch as opposed to oral steroids, as there are risks with steroids. As well I believe the finger swelling will diminish now that we removed the ring.  The cream is stronger than over the counter. Apply this twice a day.   If you have zero improvement or worsening swelling, developing pain or fever please return.

## 2020-01-02 NOTE — ED Triage Notes (Signed)
C/o insect bites on her left hand x1day. Left hand is swelling.

## 2020-01-22 ENCOUNTER — Ambulatory Visit (INDEPENDENT_AMBULATORY_CARE_PROVIDER_SITE_OTHER): Payer: Medicare Other | Admitting: *Deleted

## 2020-01-22 DIAGNOSIS — Z95810 Presence of automatic (implantable) cardiac defibrillator: Secondary | ICD-10-CM | POA: Diagnosis not present

## 2020-01-22 LAB — CUP PACEART REMOTE DEVICE CHECK
Battery Remaining Longevity: 2 mo
Battery Voltage: 2.73 V
Brady Statistic AP VP Percent: 0.03 %
Brady Statistic AP VS Percent: 0 %
Brady Statistic AS VP Percent: 98.56 %
Brady Statistic AS VS Percent: 1.41 %
Brady Statistic RA Percent Paced: 0.03 %
Brady Statistic RV Percent Paced: 28.62 %
Date Time Interrogation Session: 20210426042306
HighPow Impedance: 82 Ohm
Implantable Lead Implant Date: 20161013
Implantable Lead Implant Date: 20161013
Implantable Lead Implant Date: 20161013
Implantable Lead Location: 753858
Implantable Lead Location: 753859
Implantable Lead Location: 753860
Implantable Lead Model: 4398
Implantable Lead Model: 5076
Implantable Pulse Generator Implant Date: 20161013
Lead Channel Impedance Value: 156.471
Lead Channel Impedance Value: 156.471
Lead Channel Impedance Value: 165.351
Lead Channel Impedance Value: 190 Ohm
Lead Channel Impedance Value: 203.256
Lead Channel Impedance Value: 266 Ohm
Lead Channel Impedance Value: 380 Ohm
Lead Channel Impedance Value: 380 Ohm
Lead Channel Impedance Value: 380 Ohm
Lead Channel Impedance Value: 437 Ohm
Lead Channel Impedance Value: 456 Ohm
Lead Channel Impedance Value: 513 Ohm
Lead Channel Impedance Value: 551 Ohm
Lead Channel Impedance Value: 551 Ohm
Lead Channel Impedance Value: 570 Ohm
Lead Channel Impedance Value: 570 Ohm
Lead Channel Impedance Value: 646 Ohm
Lead Channel Impedance Value: 703 Ohm
Lead Channel Pacing Threshold Amplitude: 0.625 V
Lead Channel Pacing Threshold Amplitude: 0.75 V
Lead Channel Pacing Threshold Amplitude: 3.75 V
Lead Channel Pacing Threshold Pulse Width: 0.4 ms
Lead Channel Pacing Threshold Pulse Width: 0.4 ms
Lead Channel Pacing Threshold Pulse Width: 0.8 ms
Lead Channel Sensing Intrinsic Amplitude: 2.625 mV
Lead Channel Sensing Intrinsic Amplitude: 2.625 mV
Lead Channel Sensing Intrinsic Amplitude: 21.625 mV
Lead Channel Sensing Intrinsic Amplitude: 21.625 mV
Lead Channel Setting Pacing Amplitude: 1.5 V
Lead Channel Setting Pacing Amplitude: 2 V
Lead Channel Setting Pacing Amplitude: 3.25 V
Lead Channel Setting Pacing Pulse Width: 0.3 ms
Lead Channel Setting Pacing Pulse Width: 1 ms
Lead Channel Setting Sensing Sensitivity: 0.3 mV

## 2020-01-22 NOTE — Progress Notes (Signed)
ICD Remote  

## 2020-02-13 ENCOUNTER — Telehealth: Payer: Self-pay | Admitting: Emergency Medicine

## 2020-02-13 NOTE — Telephone Encounter (Signed)
Notified patient that ICD at RRT. Patient saw Macky Lower PA on 12/04/19 and the discussion of risks and benefits of replacement was documented in the Epic note. Patient informed she will be receiving a call to schedule the procedure. She requested that the generator change be done in mid June.

## 2020-02-15 ENCOUNTER — Other Ambulatory Visit: Payer: Self-pay | Admitting: Internal Medicine

## 2020-02-15 DIAGNOSIS — M79662 Pain in left lower leg: Secondary | ICD-10-CM

## 2020-02-15 DIAGNOSIS — M79661 Pain in right lower leg: Secondary | ICD-10-CM

## 2020-02-19 ENCOUNTER — Telehealth: Payer: Self-pay

## 2020-02-19 DIAGNOSIS — I5022 Chronic systolic (congestive) heart failure: Secondary | ICD-10-CM

## 2020-02-19 NOTE — Telephone Encounter (Signed)
Uvaldo Rising, RN at 02/13/2020 9:52 AM  Status: Signed    Notified patient that ICD at RRT. Patient saw Macky Lower PA on 12/04/19 and the discussion of risks and benefits of replacement was documented in the Epic note. Patient informed she will be receiving a call to schedule the procedure. She requested that the generator change be done in mid June.

## 2020-02-20 NOTE — Telephone Encounter (Signed)
Pt scheduled for ICD gen change Mar 15, 2020 at 11:30 am  Labs/covid test June 15

## 2020-02-21 ENCOUNTER — Ambulatory Visit
Admission: RE | Admit: 2020-02-21 | Discharge: 2020-02-21 | Disposition: A | Discharge status: Home / Self Care | Payer: Medicare Other | Source: Ambulatory Visit | Attending: Internal Medicine | Admitting: Internal Medicine

## 2020-02-21 DIAGNOSIS — M79661 Pain in right lower leg: Secondary | ICD-10-CM

## 2020-02-21 DIAGNOSIS — M79662 Pain in left lower leg: Secondary | ICD-10-CM

## 2020-02-21 NOTE — Telephone Encounter (Signed)
Work up complete ° °Instruction letter mailed to Pt. °

## 2020-02-22 ENCOUNTER — Ambulatory Visit (INDEPENDENT_AMBULATORY_CARE_PROVIDER_SITE_OTHER): Payer: Medicare Other | Admitting: *Deleted

## 2020-02-22 ENCOUNTER — Telehealth: Payer: Self-pay

## 2020-02-22 DIAGNOSIS — I428 Other cardiomyopathies: Secondary | ICD-10-CM | POA: Diagnosis not present

## 2020-02-22 LAB — CUP PACEART REMOTE DEVICE CHECK
Battery Remaining Longevity: 1 mo — CL
Battery Voltage: 2.68 V
Brady Statistic AP VP Percent: 0.11 %
Brady Statistic AP VS Percent: 0 %
Brady Statistic AS VP Percent: 98.48 %
Brady Statistic AS VS Percent: 1.41 %
Brady Statistic RA Percent Paced: 0.11 %
Brady Statistic RV Percent Paced: 21.8 %
Date Time Interrogation Session: 20210527012305
HighPow Impedance: 67 Ohm
Implantable Lead Implant Date: 20161013
Implantable Lead Implant Date: 20161013
Implantable Lead Implant Date: 20161013
Implantable Lead Location: 753858
Implantable Lead Location: 753859
Implantable Lead Location: 753860
Implantable Lead Model: 4398
Implantable Lead Model: 5076
Implantable Pulse Generator Implant Date: 20161013
Lead Channel Impedance Value: 156.471
Lead Channel Impedance Value: 156.471
Lead Channel Impedance Value: 165.351
Lead Channel Impedance Value: 190 Ohm
Lead Channel Impedance Value: 203.256
Lead Channel Impedance Value: 266 Ohm
Lead Channel Impedance Value: 380 Ohm
Lead Channel Impedance Value: 380 Ohm
Lead Channel Impedance Value: 380 Ohm
Lead Channel Impedance Value: 437 Ohm
Lead Channel Impedance Value: 437 Ohm
Lead Channel Impedance Value: 513 Ohm
Lead Channel Impedance Value: 551 Ohm
Lead Channel Impedance Value: 551 Ohm
Lead Channel Impedance Value: 570 Ohm
Lead Channel Impedance Value: 570 Ohm
Lead Channel Impedance Value: 608 Ohm
Lead Channel Impedance Value: 665 Ohm
Lead Channel Pacing Threshold Amplitude: 0.625 V
Lead Channel Pacing Threshold Amplitude: 0.75 V
Lead Channel Pacing Threshold Amplitude: 3.75 V
Lead Channel Pacing Threshold Pulse Width: 0.4 ms
Lead Channel Pacing Threshold Pulse Width: 0.4 ms
Lead Channel Pacing Threshold Pulse Width: 0.8 ms
Lead Channel Sensing Intrinsic Amplitude: 15.125 mV
Lead Channel Sensing Intrinsic Amplitude: 15.125 mV
Lead Channel Sensing Intrinsic Amplitude: 2.125 mV
Lead Channel Sensing Intrinsic Amplitude: 2.125 mV
Lead Channel Setting Pacing Amplitude: 1.5 V
Lead Channel Setting Pacing Amplitude: 2 V
Lead Channel Setting Pacing Amplitude: 3.25 V
Lead Channel Setting Pacing Pulse Width: 0.3 ms
Lead Channel Setting Pacing Pulse Width: 1 ms
Lead Channel Setting Sensing Sensitivity: 0.3 mV

## 2020-02-22 NOTE — Telephone Encounter (Signed)
Carelink alert received 02/22/20 for optivol fluid index increasing. Called patient to assess, patient denies any complaints such as lower leg swelling, shortness of breath or any other complaints related. Reports of medication compliance. Informed patient I will forward her information to [facility] clinic. Verbalizes understanding. Informed patient to call DC back if she has any questions or concerns, verbalizes understanding.

## 2020-02-23 NOTE — Progress Notes (Signed)
Remote ICD transmission.   

## 2020-03-05 NOTE — Addendum Note (Signed)
Addended by: Roney Mans A on: 03/05/2020 04:02 PM   Modules accepted: Orders

## 2020-03-12 ENCOUNTER — Other Ambulatory Visit (HOSPITAL_COMMUNITY): Payer: Medicare Other

## 2020-03-12 ENCOUNTER — Other Ambulatory Visit: Payer: Medicare Other

## 2020-03-12 ENCOUNTER — Other Ambulatory Visit: Payer: Self-pay

## 2020-03-12 DIAGNOSIS — I5022 Chronic systolic (congestive) heart failure: Secondary | ICD-10-CM

## 2020-03-12 LAB — CBC WITH DIFFERENTIAL/PLATELET
Basophils Absolute: 0 10*3/uL (ref 0.0–0.2)
Basos: 1 %
EOS (ABSOLUTE): 0.2 10*3/uL (ref 0.0–0.4)
Eos: 4 %
Hematocrit: 39.7 % (ref 34.0–46.6)
Hemoglobin: 13.4 g/dL (ref 11.1–15.9)
Lymphocytes Absolute: 1.6 10*3/uL (ref 0.7–3.1)
Lymphs: 25 %
MCH: 30.2 pg (ref 26.6–33.0)
MCHC: 33.8 g/dL (ref 31.5–35.7)
MCV: 90 fL (ref 79–97)
Monocytes Absolute: 0.6 10*3/uL (ref 0.1–0.9)
Monocytes: 9 %
Neutrophils Absolute: 4.1 10*3/uL (ref 1.4–7.0)
Neutrophils: 61 %
Platelets: 240 10*3/uL (ref 150–450)
RBC: 4.43 x10E6/uL (ref 3.77–5.28)
RDW: 13.9 % (ref 11.7–15.4)
WBC: 6.5 10*3/uL (ref 3.4–10.8)

## 2020-03-12 LAB — BASIC METABOLIC PANEL
BUN/Creatinine Ratio: 20 (ref 12–28)
BUN: 19 mg/dL (ref 8–27)
CO2: 26 mmol/L (ref 20–29)
Calcium: 10.6 mg/dL — ABNORMAL HIGH (ref 8.7–10.3)
Chloride: 103 mmol/L (ref 96–106)
Creatinine, Ser: 0.96 mg/dL (ref 0.57–1.00)
GFR calc Af Amer: 66 mL/min/{1.73_m2} (ref >59)
GFR calc non Af Amer: 57 mL/min/{1.73_m2} — ABNORMAL LOW (ref >59)
Glucose: 135 mg/dL — ABNORMAL HIGH (ref 65–99)
Potassium: 3.7 mmol/L (ref 3.5–5.2)
Sodium: 137 mmol/L (ref 134–144)

## 2020-03-15 ENCOUNTER — Ambulatory Visit (HOSPITAL_COMMUNITY)
Admission: RE | Admit: 2020-03-15 | Discharge: 2020-03-15 | Disposition: A | Discharge status: Home / Self Care | Payer: Medicare Other | Attending: Internal Medicine | Admitting: Internal Medicine

## 2020-03-15 ENCOUNTER — Encounter (HOSPITAL_COMMUNITY)
Admission: RE | Disposition: A | Discharge status: Home / Self Care | Payer: Self-pay | Source: Home / Self Care | Attending: Internal Medicine

## 2020-03-15 ENCOUNTER — Other Ambulatory Visit: Payer: Self-pay

## 2020-03-15 DIAGNOSIS — I447 Left bundle-branch block, unspecified: Secondary | ICD-10-CM | POA: Insufficient documentation

## 2020-03-15 DIAGNOSIS — F17210 Nicotine dependence, cigarettes, uncomplicated: Secondary | ICD-10-CM | POA: Diagnosis not present

## 2020-03-15 DIAGNOSIS — Z7982 Long term (current) use of aspirin: Secondary | ICD-10-CM | POA: Diagnosis not present

## 2020-03-15 DIAGNOSIS — Z79899 Other long term (current) drug therapy: Secondary | ICD-10-CM | POA: Insufficient documentation

## 2020-03-15 DIAGNOSIS — E669 Obesity, unspecified: Secondary | ICD-10-CM | POA: Diagnosis not present

## 2020-03-15 DIAGNOSIS — Z006 Encounter for examination for normal comparison and control in clinical research program: Secondary | ICD-10-CM | POA: Insufficient documentation

## 2020-03-15 DIAGNOSIS — I428 Other cardiomyopathies: Secondary | ICD-10-CM | POA: Diagnosis not present

## 2020-03-15 DIAGNOSIS — Z4502 Encounter for adjustment and management of automatic implantable cardiac defibrillator: Secondary | ICD-10-CM

## 2020-03-15 DIAGNOSIS — Z6832 Body mass index (BMI) 32.0-32.9, adult: Secondary | ICD-10-CM | POA: Insufficient documentation

## 2020-03-15 DIAGNOSIS — I5022 Chronic systolic (congestive) heart failure: Secondary | ICD-10-CM

## 2020-03-15 DIAGNOSIS — I110 Hypertensive heart disease with heart failure: Secondary | ICD-10-CM | POA: Diagnosis not present

## 2020-03-15 HISTORY — PX: BIV ICD GENERATOR CHANGEOUT: EP1194

## 2020-03-15 SURGERY — BIV ICD GENERATOR CHANGEOUT

## 2020-03-15 MED ORDER — CEFAZOLIN SODIUM-DEXTROSE 2-4 GM/100ML-% IV SOLN
INTRAVENOUS | Status: AC
  Filled 2020-03-15: qty 100

## 2020-03-15 MED ORDER — ONDANSETRON HCL 4 MG/2ML IJ SOLN: 4.0000 mg | Freq: Four times a day (QID) | INTRAMUSCULAR | Status: DC | PRN

## 2020-03-15 MED ORDER — ACETAMINOPHEN 325 MG PO TABS: 325.0000 mg | ORAL_TABLET | ORAL | Status: DC | PRN

## 2020-03-15 MED ORDER — SODIUM CHLORIDE 0.9 % IV SOLN
INTRAVENOUS | Status: AC
  Filled 2020-03-15: qty 2

## 2020-03-15 MED ORDER — SODIUM CHLORIDE 0.9 % IV SOLN
80.0000 mg | INTRAVENOUS | Status: AC
  Administered 2020-03-15: 80 mg
  Filled 2020-03-15: qty 2

## 2020-03-15 MED ORDER — LIDOCAINE HCL (PF) 1 % IJ SOLN
INTRAMUSCULAR | Status: DC | PRN
  Administered 2020-03-15: 60 mL

## 2020-03-15 MED ORDER — FENTANYL CITRATE (PF) 100 MCG/2ML IJ SOLN
INTRAMUSCULAR | Status: AC
  Filled 2020-03-15: qty 2

## 2020-03-15 MED ORDER — MIDAZOLAM HCL 5 MG/5ML IJ SOLN
INTRAMUSCULAR | Status: AC
  Filled 2020-03-15: qty 5

## 2020-03-15 MED ORDER — SODIUM CHLORIDE 0.9 % IV SOLN: INTRAVENOUS | Status: DC

## 2020-03-15 MED ORDER — CEFAZOLIN SODIUM-DEXTROSE 2-4 GM/100ML-% IV SOLN
2.0000 g | INTRAVENOUS | Status: AC
  Administered 2020-03-15: 2 g via INTRAVENOUS
  Filled 2020-03-15: qty 100

## 2020-03-15 MED ORDER — LIDOCAINE HCL 1 % IJ SOLN
INTRAMUSCULAR | Status: AC
  Filled 2020-03-15: qty 60

## 2020-03-15 MED ORDER — FENTANYL CITRATE (PF) 100 MCG/2ML IJ SOLN
INTRAMUSCULAR | Status: DC | PRN
  Administered 2020-03-15: 25 ug via INTRAVENOUS
  Administered 2020-03-15: 12.5 ug via INTRAVENOUS

## 2020-03-15 MED ORDER — CHLORHEXIDINE GLUCONATE 4 % EX LIQD
4.0000 "application " | Freq: Once | CUTANEOUS | Status: DC
  Filled 2020-03-15: qty 60

## 2020-03-15 MED ORDER — MIDAZOLAM HCL 5 MG/5ML IJ SOLN
INTRAMUSCULAR | Status: DC | PRN
  Administered 2020-03-15: 2 mg via INTRAVENOUS
  Administered 2020-03-15: 1 mg via INTRAVENOUS

## 2020-03-15 SURGICAL SUPPLY — 4 items
CABLE SURGICAL S-101-97-12 (CABLE) ×2 IMPLANT
ICD CLARIA MRI DTMA1QQ (ICD Generator) ×1 IMPLANT
PAD PRO RADIOLUCENT 2001M-C (PAD) ×2 IMPLANT
TRAY PACEMAKER INSERTION (PACKS) ×2 IMPLANT

## 2020-03-15 NOTE — H&P (Signed)
HPI Nicole Chan returns today for ongoing evaluation and management of her ICD and chronic systolic heart failure. She is a very pleasant 79 year old woman with a history of chronic systolic heart failure, a nonischemic cardiomyopathy, hypertension and obesity. She underwent biventricular ICD implantation secondary to all the above several years ago. Her heart failure has remained class II. She has not been hospitalized with heart failure symptoms. She has had no ICD shocks. She denies syncope. No edema.     Allergies  Allergen Reactions  . Shellfish Allergy Itching           Current Outpatient Medications  Medication Sig Dispense Refill  . aspirin EC 81 MG EC tablet Take 1 tablet (81 mg total) by mouth daily.    . carvedilol (COREG) 12.5 MG tablet Take 1 tablet (12.5 mg total) by mouth 2 (two) times daily. Please keep upcoming appt with Dr. Lovena Le in October. Thank you 180 tablet 0  . irbesartan-hydrochlorothiazide (AVALIDE) 300-12.5 MG tablet Take 1 tablet by mouth daily. 90 tablet 3  . nitroGLYCERIN (NITROSTAT) 0.4 MG SL tablet Place 1 tablet (0.4 mg total) under the tongue every 5 (five) minutes as needed for chest pain. 25 tablet 4  . potassium chloride SA (K-DUR,KLOR-CON) 20 MEQ tablet Take 1 tablet (20 mEq total) by mouth daily. 30 tablet 11  . Vitamin D, Ergocalciferol, (DRISDOL) 50000 UNITS CAPS capsule Take 50,000 Units by mouth 2 (two) times a week.      No current facility-administered medications for this visit.          Past Medical History:  Diagnosis Date  . Arthritis    "maybe in my hands/fingers; not terrible" (07/12/2015)  . Chronic systolic CHF (congestive heart failure) ([place])   . Dilated cardiomyopathy ([place])   . Episode of dizziness, secondary to cardiomyopathy possibly  10/25/2014  . Essential hypertension   . Hypertensive heart disease   . LBBB (left bundle branch block)    a. noted on 10/24/14 admission for dizziness   .  NICM (nonischemic cardiomyopathy) ([place])    a. Cath 10/29/14: normal coronary arteries (tortuous, compatible with hypertension). b. s/p Medtronic BiV-ICD 06/2015.  . Tobacco abuse     ROS:   All systems reviewed and negative except as noted in the HPI.        Past Surgical History:  Procedure Laterality Date  . ABDOMINAL HYSTERECTOMY  1970's  . APPENDECTOMY    . BI-VENTRICULAR IMPLANTABLE CARDIOVERTER DEFIBRILLATOR  (CRT-D)  07/11/2015  . CARDIAC CATHETERIZATION    . EP IMPLANTABLE DEVICE N/A 07/11/2015   Procedure: BiV ICD Insertion CRT-D;  Surgeon: Evans Lance, MD;  Location: [place] CV LAB;  Service: Cardiovascular;  Laterality: N/A;  . LEFT HEART CATHETERIZATION WITH CORONARY ANGIOGRAM N/A 10/29/2014   Procedure: LEFT HEART CATHETERIZATION WITH CORONARY ANGIOGRAM;  Surgeon: Sinclair Grooms, MD;  Location: [facility] CATH LAB;  Service: Cardiovascular;  Laterality: N/A;  . TONSILLECTOMY    . TUBAL LIGATION  1970's          Family History  Problem Relation Age of Onset  . Arrhythmia Mother   . Hypertension Mother   . Diabetes Mother   . Heart disease Mother   . Cancer Father   . Lung cancer Sister   . Healthy Paternal Aunt   . Heart attack Neg Hx   . Stroke Neg Hx      Social History        Socioeconomic History  .  Marital status: Married    Spouse name: Not on file  . Number of children: Not on file  . Years of education: Not on file  . Highest education level: Not on file  Occupational History  . Not on file  Social Needs  . Financial resource strain: Not on file  . Food insecurity:    Worry: Not on file    Inability: Not on file  . Transportation needs:    Medical: Not on file    Non-medical: Not on file  Tobacco Use  . Smoking status: Current Every Day Smoker    Packs/day: 0.33    Years: 55.00    Pack years: 18.15    Types: Cigarettes  . Smokeless tobacco: Never Used  Substance and Sexual  Activity  . Alcohol use: No    Alcohol/week: 0.0 standard drinks    Comment: on special occasions  . Drug use: No  . Sexual activity: Yes  Lifestyle  . Physical activity:    Days per week: Not on file    Minutes per session: Not on file  . Stress: Not on file  Relationships  . Social connections:    Talks on phone: Not on file    Gets together: Not on file    Attends religious service: Not on file    Active member of club or organization: Not on file    Attends meetings of clubs or organizations: Not on file    Relationship status: Not on file  . Intimate partner violence:    Fear of current or ex partner: Not on file    Emotionally abused: Not on file    Physically abused: Not on file    Forced sexual activity: Not on file  Other Topics Concern  . Not on file  Social History Narrative  . Not on file     BP (!) 130/92   Pulse 77   Ht 5\' 8"  (1.727 m)   Wt 230 lb 3.2 oz (104.4 kg)   SpO2 96%   BMI 35.00 kg/m   Physical Exam:  Well appearing obese woman, NAD HEENT: Unremarkable Neck:  6 cm JVD, no thyromegally Lymphatics:  No adenopathy Back:  No CVA tenderness Lungs:  Clear with no wheezes HEART:  Regular rate rhythm, no murmurs, no rubs, no clicks Abd:  soft, positive bowel sounds, no organomegally, no rebound, no guarding Ext:  2 plus pulses, no edema, no cyanosis, no clubbing Skin:  No rashes no nodules Neuro:  CN II through XII intact, motor grossly intact  EKG - nsr with biv pacing  DEVICE  Normal device function.  See PaceArt for details.   Assess/Plan: 1. Chronic systolic heart failure - she is euvolemic and class 2. She will continue her current meds. 2. HTN - her blood pressure today is fairly well controlled. No change in meds. 3. ICD - her Medtronic Biv ICD is working normally.  We will recheck in several months.  .  EP Attending  Patient seen and examined. Agree with the findings as noted  above. The patient is doing well and presents for ICD gen change out. I have reviewed the indications/risks/benefits/goals/expectations and she wishes to proceed.  Gerhard Munch.D.

## 2020-03-15 NOTE — Discharge Instructions (Signed)

## 2020-03-17 ENCOUNTER — Other Ambulatory Visit: Payer: Self-pay

## 2020-03-17 ENCOUNTER — Emergency Department (HOSPITAL_COMMUNITY)
Admission: EM | Admit: 2020-03-17 | Discharge: 2020-03-17 | Disposition: A | Discharge status: Home / Self Care | Payer: Medicare Other | Attending: Emergency Medicine | Admitting: Emergency Medicine

## 2020-03-17 ENCOUNTER — Encounter (HOSPITAL_COMMUNITY): Payer: Self-pay | Admitting: Emergency Medicine

## 2020-03-17 DIAGNOSIS — Z5321 Procedure and treatment not carried out due to patient leaving prior to being seen by health care provider: Secondary | ICD-10-CM | POA: Diagnosis not present

## 2020-03-17 DIAGNOSIS — Z950 Presence of cardiac pacemaker: Secondary | ICD-10-CM | POA: Diagnosis not present

## 2020-03-17 NOTE — ED Triage Notes (Signed)
Per pt, states she was visiting her husband on the floor-as she was leaving with her son, she got real hot-recently had a pacemaker placed on friday

## 2020-03-17 NOTE — ED Notes (Signed)
Per registration, patient turned in labels and reports she is leaving. 

## 2020-03-18 ENCOUNTER — Encounter (HOSPITAL_COMMUNITY): Payer: Self-pay | Admitting: Internal Medicine

## 2020-03-18 MED FILL — Lidocaine HCl Local Inj 1%: INTRAMUSCULAR | Qty: 60 | Status: AC

## 2020-03-18 NOTE — Telephone Encounter (Signed)
Patient referred to [facility] clinic by Unk Lightning, Device RN.  Patient has device battery change on 03/15/2020 and will need 6 weeks to develop Optivol baseline in order to be followed in [facility] clinic.

## 2020-03-26 ENCOUNTER — Other Ambulatory Visit: Payer: Self-pay

## 2020-03-26 ENCOUNTER — Ambulatory Visit (INDEPENDENT_AMBULATORY_CARE_PROVIDER_SITE_OTHER): Payer: Medicare Other | Admitting: Emergency Medicine

## 2020-03-26 DIAGNOSIS — I5022 Chronic systolic (congestive) heart failure: Secondary | ICD-10-CM

## 2020-03-26 DIAGNOSIS — Z95810 Presence of automatic (implantable) cardiac defibrillator: Secondary | ICD-10-CM | POA: Diagnosis not present

## 2020-03-26 DIAGNOSIS — I428 Other cardiomyopathies: Secondary | ICD-10-CM

## 2020-03-29 LAB — CUP PACEART INCLINIC DEVICE CHECK
Battery Remaining Longevity: 55 mo
Battery Voltage: 3.09 V
Brady Statistic AP VP Percent: 0.2 %
Brady Statistic AP VS Percent: 0.01 %
Brady Statistic AS VP Percent: 99.76 %
Brady Statistic AS VS Percent: 0.03 %
Brady Statistic RA Percent Paced: 0.21 %
Brady Statistic RV Percent Paced: 99.88 %
Date Time Interrogation Session: 20210629140600
HighPow Impedance: 66 Ohm
Implantable Lead Implant Date: 20161013
Implantable Lead Implant Date: 20161013
Implantable Lead Implant Date: 20161013
Implantable Lead Location: 753858
Implantable Lead Location: 753859
Implantable Lead Location: 753860
Implantable Lead Model: 4398
Implantable Lead Model: 5076
Implantable Pulse Generator Implant Date: 20210618
Lead Channel Impedance Value: 168.889
Lead Channel Impedance Value: 172.541
Lead Channel Impedance Value: 188.19 Ohm
Lead Channel Impedance Value: 194.634
Lead Channel Impedance Value: 220.723
Lead Channel Impedance Value: 304 Ohm
Lead Channel Impedance Value: 380 Ohm
Lead Channel Impedance Value: 380 Ohm
Lead Channel Impedance Value: 399 Ohm
Lead Channel Impedance Value: 399 Ohm
Lead Channel Impedance Value: 494 Ohm
Lead Channel Impedance Value: 570 Ohm
Lead Channel Impedance Value: 570 Ohm
Lead Channel Impedance Value: 589 Ohm
Lead Channel Impedance Value: 627 Ohm
Lead Channel Impedance Value: 646 Ohm
Lead Channel Impedance Value: 665 Ohm
Lead Channel Impedance Value: 760 Ohm
Lead Channel Pacing Threshold Amplitude: 0.75 V
Lead Channel Pacing Threshold Amplitude: 0.75 V
Lead Channel Pacing Threshold Amplitude: 3.25 V
Lead Channel Pacing Threshold Pulse Width: 0.4 ms
Lead Channel Pacing Threshold Pulse Width: 0.4 ms
Lead Channel Pacing Threshold Pulse Width: 1 ms
Lead Channel Sensing Intrinsic Amplitude: 13.625 mV
Lead Channel Sensing Intrinsic Amplitude: 3 mV
Lead Channel Setting Pacing Amplitude: 0.5 V
Lead Channel Setting Pacing Amplitude: 1.5 V
Lead Channel Setting Pacing Amplitude: 4 V
Lead Channel Setting Pacing Pulse Width: 0.03 ms
Lead Channel Setting Pacing Pulse Width: 1 ms
Lead Channel Setting Sensing Sensitivity: 0.3 mV

## 2020-03-29 NOTE — Progress Notes (Signed)
Wound check appointment. Steri-strips removed. Wound without redness or edema. Incision edges approximated, wound well healed. Normal device function. Thresholds, sensing, and impedances consistent with implant measurements. RV output remains programmed sub-threshold per Dr. Ladona Ridgel. Outputs at chronic settings s/p generator replacement. Patient reports intermittent PNS with LV1-RVc vector, reprogrammed to previous vector (LV2-RVc) per Dr. Ladona Ridgel. Histogram distribution appropriate for patient and level of activity. No mode switches or ventricular arrhythmias noted. LVP 99.9% (effective 88.9%, likely due to intermittent LOC per stored Effective CRT episodes, will continue to monitor for now due to vector change today). Patient educated about wound care, arm mobility, shock plan, and Carelink monitor. Carelink on 06/14/20 and ROV with Dr. Ladona Ridgel on 06/17/20.

## 2020-06-14 ENCOUNTER — Ambulatory Visit (INDEPENDENT_AMBULATORY_CARE_PROVIDER_SITE_OTHER): Payer: Medicare Other | Admitting: *Deleted

## 2020-06-14 DIAGNOSIS — I428 Other cardiomyopathies: Secondary | ICD-10-CM | POA: Diagnosis not present

## 2020-06-14 LAB — CUP PACEART REMOTE DEVICE CHECK
Battery Remaining Longevity: 49 mo
Battery Voltage: 3.01 V
Brady Statistic AP VP Percent: 0.14 %
Brady Statistic AP VS Percent: 0.03 %
Brady Statistic AS VP Percent: 99.8 %
Brady Statistic AS VS Percent: 0.03 %
Brady Statistic RA Percent Paced: 0.17 %
Brady Statistic RV Percent Paced: 99.79 %
Date Time Interrogation Session: 20210917033307
HighPow Impedance: 76 Ohm
Implantable Lead Implant Date: 20161013
Implantable Lead Implant Date: 20161013
Implantable Lead Implant Date: 20161013
Implantable Lead Location: 753858
Implantable Lead Location: 753859
Implantable Lead Location: 753860
Implantable Lead Model: 4398
Implantable Lead Model: 5076
Implantable Pulse Generator Implant Date: 20210618
Lead Channel Impedance Value: 174.595
Lead Channel Impedance Value: 174.595
Lead Channel Impedance Value: 185.725
Lead Channel Impedance Value: 190 Ohm
Lead Channel Impedance Value: 203.256
Lead Channel Impedance Value: 323 Ohm
Lead Channel Impedance Value: 380 Ohm
Lead Channel Impedance Value: 380 Ohm
Lead Channel Impedance Value: 380 Ohm
Lead Channel Impedance Value: 437 Ohm
Lead Channel Impedance Value: 456 Ohm
Lead Channel Impedance Value: 570 Ohm
Lead Channel Impedance Value: 570 Ohm
Lead Channel Impedance Value: 570 Ohm
Lead Channel Impedance Value: 589 Ohm
Lead Channel Impedance Value: 627 Ohm
Lead Channel Impedance Value: 665 Ohm
Lead Channel Impedance Value: 665 Ohm
Lead Channel Pacing Threshold Amplitude: 0.5 V
Lead Channel Pacing Threshold Amplitude: 3.5 V
Lead Channel Pacing Threshold Pulse Width: 0.4 ms
Lead Channel Pacing Threshold Pulse Width: 1 ms
Lead Channel Sensing Intrinsic Amplitude: 11 mV
Lead Channel Sensing Intrinsic Amplitude: 11 mV
Lead Channel Sensing Intrinsic Amplitude: 2.375 mV
Lead Channel Sensing Intrinsic Amplitude: 2.375 mV
Lead Channel Setting Pacing Amplitude: 0.5 V
Lead Channel Setting Pacing Amplitude: 1.5 V
Lead Channel Setting Pacing Amplitude: 4 V
Lead Channel Setting Pacing Pulse Width: 0.03 ms
Lead Channel Setting Pacing Pulse Width: 1 ms
Lead Channel Setting Sensing Sensitivity: 0.3 mV

## 2020-06-17 ENCOUNTER — Encounter: Payer: Self-pay | Admitting: Internal Medicine

## 2020-06-17 ENCOUNTER — Ambulatory Visit: Payer: Medicare Other | Admitting: Internal Medicine

## 2020-06-17 VITALS — BP 116/72 | HR 72 | Ht 67.0 in | Wt 211.0 lb

## 2020-06-17 DIAGNOSIS — I5022 Chronic systolic (congestive) heart failure: Secondary | ICD-10-CM | POA: Diagnosis not present

## 2020-06-17 DIAGNOSIS — Z95810 Presence of automatic (implantable) cardiac defibrillator: Secondary | ICD-10-CM | POA: Diagnosis not present

## 2020-06-17 DIAGNOSIS — I10 Essential (primary) hypertension: Secondary | ICD-10-CM | POA: Diagnosis not present

## 2020-06-17 LAB — CUP PACEART INCLINIC DEVICE CHECK
Battery Remaining Longevity: 49 mo
Battery Voltage: 3 V
Brady Statistic AP VP Percent: 0.3 %
Brady Statistic AP VS Percent: 0.03 %
Brady Statistic AS VP Percent: 99.64 %
Brady Statistic AS VS Percent: 0.03 %
Brady Statistic RA Percent Paced: 0.33 %
Brady Statistic RV Percent Paced: 99.79 %
Date Time Interrogation Session: 20210920170842
HighPow Impedance: 62 Ohm
Implantable Lead Implant Date: 20161013
Implantable Lead Implant Date: 20161013
Implantable Lead Implant Date: 20161013
Implantable Lead Location: 753858
Implantable Lead Location: 753859
Implantable Lead Location: 753860
Implantable Lead Model: 4398
Implantable Lead Model: 5076
Implantable Pulse Generator Implant Date: 20210618
Lead Channel Impedance Value: 168.889
Lead Channel Impedance Value: 168.889
Lead Channel Impedance Value: 179.282
Lead Channel Impedance Value: 190 Ohm
Lead Channel Impedance Value: 203.256
Lead Channel Impedance Value: 304 Ohm
Lead Channel Impedance Value: 380 Ohm
Lead Channel Impedance Value: 380 Ohm
Lead Channel Impedance Value: 399 Ohm
Lead Channel Impedance Value: 437 Ohm
Lead Channel Impedance Value: 456 Ohm
Lead Channel Impedance Value: 570 Ohm
Lead Channel Impedance Value: 570 Ohm
Lead Channel Impedance Value: 589 Ohm
Lead Channel Impedance Value: 627 Ohm
Lead Channel Impedance Value: 627 Ohm
Lead Channel Impedance Value: 646 Ohm
Lead Channel Impedance Value: 665 Ohm
Lead Channel Pacing Threshold Amplitude: 0.5 V
Lead Channel Pacing Threshold Amplitude: 3.75 V
Lead Channel Pacing Threshold Pulse Width: 0.4 ms
Lead Channel Pacing Threshold Pulse Width: 1 ms
Lead Channel Sensing Intrinsic Amplitude: 13.625 mV
Lead Channel Sensing Intrinsic Amplitude: 2.25 mV
Lead Channel Setting Pacing Amplitude: 0.5 V
Lead Channel Setting Pacing Amplitude: 1.5 V
Lead Channel Setting Pacing Amplitude: 4 V
Lead Channel Setting Pacing Pulse Width: 0.03 ms
Lead Channel Setting Pacing Pulse Width: 1 ms
Lead Channel Setting Sensing Sensitivity: 0.3 mV

## 2020-06-17 NOTE — Patient Instructions (Signed)
Medication Instructions:  Your physician recommends that you continue on your current medications as directed. Please refer to the Current Medication list given to you today.  Labwork: None ordered.  Testing/Procedures: None ordered.  Follow-Up: Your physician wants you to follow-up in: one year with Dr. Ladona Ridgel.   You will receive a reminder letter in the mail two months in advance. If you don't receive a letter, please call our office to schedule the follow-up appointment.  Remote monitoring is used to monitor your ICD from home. This monitoring reduces the number of office visits required to check your device to one time per year. It allows [place] to keep an eye on the functioning of your device to ensure it is working properly. You are scheduled for a device check from home on 09/13/2020. You may send your transmission at any time that day. If you have a wireless device, the transmission will be sent automatically. After your physician reviews your transmission, you will receive a postcard with your next transmission date.  Any Other Special Instructions Will Be Listed Below (If Applicable).  If you need a refill on your cardiac medications before your next appointment, please call your pharmacy.

## 2020-06-17 NOTE — Progress Notes (Signed)
Remote ICD transmission.   

## 2020-06-17 NOTE — Progress Notes (Signed)
HPI [name] returns today for followup. She is a pleasant 79 yo woman with a non-ischemic CM, LBBB, s/p biv ICD insertion with ICD gen change out several months ago. In the interim, she notes that she lost her husband of 56 years. He had dementia. She denies chest pain or sob. She is pending a trip to [place].   Allergies  Allergen Reactions  . Shellfish Allergy Itching     Current Outpatient Medications  Medication Sig Dispense Refill  . atorvastatin (LIPITOR) 20 MG tablet Take 20 mg by mouth daily.    . carvedilol (COREG) 12.5 MG tablet TAKE 1 TABLET BY MOUTH TWICE DAILY.Marland Kitchen PLEASE KEEP APPT WITH DR/Caydan Mctavish IN OCTOBER (Patient taking differently: Take 12.5 mg by mouth 2 (two) times daily with a meal. ) 180 tablet 3  . diphenhydrAMINE-zinc acetate (BENADRYL EXTRA STRENGTH) cream Apply 1 application topically 3 (three) times daily as needed for itching. 28.4 g 0  . irbesartan-hydrochlorothiazide (AVALIDE) 300-12.5 MG tablet Take 1 tablet by mouth daily. 90 tablet 3  . magnesium oxide (MAG-OX) 400 MG tablet Take 400 mg by mouth daily.    . potassium chloride SA (K-DUR,KLOR-CON) 20 MEQ tablet Take 1 tablet (20 mEq total) by mouth daily. 30 tablet 11  . predniSONE (STERAPRED UNI-PAK 48 TAB) 10 MG (48) TBPK tablet Take 1 tablet by mouth as directed.    . Vitamin D, Ergocalciferol, (DRISDOL) 50000 UNITS CAPS capsule Take 50,000 Units by mouth 2 (two) times a week.      No current facility-administered medications for this visit.     Past Medical History:  Diagnosis Date  . Arthritis    "maybe in my hands/fingers; not terrible" (07/12/2015)  . Chronic systolic CHF (congestive heart failure) (HCC)   . Dilated cardiomyopathy (HCC)   . Episode of dizziness, secondary to cardiomyopathy possibly  10/25/2014  . Essential hypertension   . Hypertensive heart disease   . LBBB (left bundle branch block)    a. noted on 10/24/14 admission for dizziness   . NICM (nonischemic cardiomyopathy)  (HCC)    a. Cath 10/29/14: normal coronary arteries (tortuous, compatible with hypertension). b. s/p Medtronic BiV-ICD 06/2015.  . Tobacco abuse     ROS:   All systems reviewed and negative except as noted in the HPI.   Past Surgical History:  Procedure Laterality Date  . ABDOMINAL HYSTERECTOMY  1970's  . APPENDECTOMY    . BI-VENTRICULAR IMPLANTABLE CARDIOVERTER DEFIBRILLATOR  (CRT-D)  07/11/2015  . BIV ICD GENERATOR CHANGEOUT N/A 03/15/2020   Procedure: BIV ICD GENERATOR CHANGEOUT;  Surgeon: Marinus Maw, MD;  Location[facility] INVASIVE CV LAB;  Service: Cardiovascular;  Laterality: N/A;  . CARDIAC CATHETERIZATION    . EP IMPLANTABLE DEVICE N/A 07/11/2015   Procedure: BiV ICD Insertion CRT-D;  Surgeon: Marinus Maw, MD;  Location: [facility] INVASIVE CV LAB;  Service: Cardiovascular;  Laterality: N/A;  . LEFT HEART CATHETERIZATION WITH CORONARY ANGIOGRAM N/A 10/29/2014   Procedure: LEFT HEART CATHETERIZATION WITH CORONARY ANGIOGRAM;  Surgeon: Lesleigh Noe, MD;  Location: [facility] CATH LAB;  Service: Cardiovascular;  Laterality: N/A;  . TONSILLECTOMY    . TUBAL LIGATION  1970's     Family History  Problem Relation Age of Onset  . Arrhythmia Mother   . Hypertension Mother   . Diabetes Mother   . Heart disease Mother   . Cancer Father   . Lung cancer Sister   . Healthy Paternal Aunt   . Heart attack  Neg Hx   . Stroke Neg Hx      Social History   Socioeconomic History  . Marital status: Married    Spouse name: Not on file  . Number of children: Not on file  . Years of education: Not on file  . Highest education level: Not on file  Occupational History  . Not on file  Tobacco Use  . Smoking status: Current Every Day Smoker    Packs/day: 0.33    Years: 55.00    Pack years: 18.15    Types: Cigarettes  . Smokeless tobacco: Never Used  Substance and Sexual Activity  . Alcohol use: No    Alcohol/week: 0.0 standard drinks    Comment: on special occasions  . Drug use: No  .  Sexual activity: Yes  Other Topics Concern  . Not on file  Social History Narrative  . Not on file   Social Determinants of Health   Financial Resource Strain:   . Difficulty of Paying Living Expenses: Not on file  Food Insecurity:   . Worried About Programme researcher, broadcasting/film/video in the Last Year: Not on file  . Ran Out of Food in the Last Year: Not on file  Transportation Needs:   . Lack of Transportation (Medical): Not on file  . Lack of Transportation (Non-Medical): Not on file  Physical Activity:   . Days of Exercise per Week: Not on file  . Minutes of Exercise per Session: Not on file  Stress:   . Feeling of Stress : Not on file  Social Connections:   . Frequency of Communication with Friends and Family: Not on file  . Frequency of Social Gatherings with Friends and Family: Not on file  . Attends Religious Services: Not on file  . Active Member of Clubs or Organizations: Not on file  . Attends Banker Meetings: Not on file  . Marital Status: Not on file  Intimate Partner Violence:   . Fear of Current or Ex-Partner: Not on file  . Emotionally Abused: Not on file  . Physically Abused: Not on file  . Sexually Abused: Not on file     BP 116/72   Pulse 72   Ht 5\' 7"  (1.702 m)   Wt 211 lb (95.7 kg)   SpO2 98%   BMI 33.05 kg/m   Physical Exam:  Well appearing NAD HEENT: Unremarkable Neck:  No JVD, no thyromegally Lymphatics:  No adenopathy Back:  No CVA tenderness Lungs:  Clear HEART:  Regular rate rhythm, no murmurs, no rubs, no clicks Abd:  soft, positive bowel sounds, no organomegally, no rebound, no guarding Ext:  2 plus pulses, no edema, no cyanosis, no clubbing Skin:  No rashes no nodules Neuro:  CN II through XII intact, motor grossly intact  EKG - nsr with LV pacing  DEVICE  Normal device function.  See PaceArt for details.   Assess/Plan: 1. Chronic systolic heart failure - her symptoms remain class 2. She will continue her current meds.  2.  ICD -her medtronic device is working normally. Her LV threshold remains high and will result in her device depleting after only 4 years.  3. Obesity - her weight is unchanged. We will follow. She is encouraged to lose weight.   Kiaja Shorty,MD

## 2020-07-09 ENCOUNTER — Encounter: Payer: Medicare Other | Admitting: Internal Medicine

## 2020-07-24 ENCOUNTER — Other Ambulatory Visit: Payer: Self-pay | Admitting: Internal Medicine

## 2020-07-24 DIAGNOSIS — E2839 Other primary ovarian failure: Secondary | ICD-10-CM

## 2020-09-13 ENCOUNTER — Ambulatory Visit (INDEPENDENT_AMBULATORY_CARE_PROVIDER_SITE_OTHER): Payer: Medicare Other

## 2020-09-13 DIAGNOSIS — I428 Other cardiomyopathies: Secondary | ICD-10-CM

## 2020-09-13 LAB — CUP PACEART REMOTE DEVICE CHECK
Battery Remaining Longevity: 44 mo
Battery Voltage: 3 V
Brady Statistic AP VP Percent: 0.14 %
Brady Statistic AP VS Percent: 0.02 %
Brady Statistic AS VP Percent: 99.76 %
Brady Statistic AS VS Percent: 0.08 %
Brady Statistic RA Percent Paced: 0.16 %
Brady Statistic RV Percent Paced: 99.78 %
Date Time Interrogation Session: 20211217012206
HighPow Impedance: 71 Ohm
Implantable Lead Implant Date: 20161013
Implantable Lead Implant Date: 20161013
Implantable Lead Implant Date: 20161013
Implantable Lead Location: 753858
Implantable Lead Location: 753859
Implantable Lead Location: 753860
Implantable Lead Model: 4398
Implantable Lead Model: 5076
Implantable Pulse Generator Implant Date: 20210618
Lead Channel Impedance Value: 160.941
Lead Channel Impedance Value: 160.941
Lead Channel Impedance Value: 171 Ohm
Lead Channel Impedance Value: 172.541
Lead Channel Impedance Value: 184.154
Lead Channel Impedance Value: 304 Ohm
Lead Channel Impedance Value: 342 Ohm
Lead Channel Impedance Value: 342 Ohm
Lead Channel Impedance Value: 380 Ohm
Lead Channel Impedance Value: 399 Ohm
Lead Channel Impedance Value: 456 Ohm
Lead Channel Impedance Value: 532 Ohm
Lead Channel Impedance Value: 570 Ohm
Lead Channel Impedance Value: 570 Ohm
Lead Channel Impedance Value: 589 Ohm
Lead Channel Impedance Value: 589 Ohm
Lead Channel Impedance Value: 646 Ohm
Lead Channel Impedance Value: 646 Ohm
Lead Channel Pacing Threshold Amplitude: 0.625 V
Lead Channel Pacing Threshold Amplitude: 4.5 V
Lead Channel Pacing Threshold Pulse Width: 0.4 ms
Lead Channel Pacing Threshold Pulse Width: 1 ms
Lead Channel Sensing Intrinsic Amplitude: 12.375 mV
Lead Channel Sensing Intrinsic Amplitude: 12.375 mV
Lead Channel Sensing Intrinsic Amplitude: 2.125 mV
Lead Channel Sensing Intrinsic Amplitude: 2.125 mV
Lead Channel Setting Pacing Amplitude: 0.5 V
Lead Channel Setting Pacing Amplitude: 1.5 V
Lead Channel Setting Pacing Amplitude: 4 V
Lead Channel Setting Pacing Pulse Width: 0.03 ms
Lead Channel Setting Pacing Pulse Width: 1 ms
Lead Channel Setting Sensing Sensitivity: 0.3 mV

## 2020-09-26 NOTE — Progress Notes (Signed)
Remote ICD transmission.   

## 2020-10-01 ENCOUNTER — Other Ambulatory Visit: Payer: Self-pay

## 2020-10-01 DIAGNOSIS — I119 Hypertensive heart disease without heart failure: Secondary | ICD-10-CM | POA: Insufficient documentation

## 2020-10-01 DIAGNOSIS — R7309 Other abnormal glucose: Secondary | ICD-10-CM | POA: Insufficient documentation

## 2020-10-01 DIAGNOSIS — Z1211 Encounter for screening for malignant neoplasm of colon: Secondary | ICD-10-CM | POA: Insufficient documentation

## 2020-10-01 DIAGNOSIS — E669 Obesity, unspecified: Secondary | ICD-10-CM | POA: Insufficient documentation

## 2020-10-01 DIAGNOSIS — I739 Peripheral vascular disease, unspecified: Secondary | ICD-10-CM

## 2020-10-01 DIAGNOSIS — R141 Gas pain: Secondary | ICD-10-CM | POA: Insufficient documentation

## 2020-10-01 DIAGNOSIS — M179 Osteoarthritis of knee, unspecified: Secondary | ICD-10-CM | POA: Insufficient documentation

## 2020-10-01 DIAGNOSIS — R142 Eructation: Secondary | ICD-10-CM | POA: Insufficient documentation

## 2020-10-01 DIAGNOSIS — M1710 Unilateral primary osteoarthritis, unspecified knee: Secondary | ICD-10-CM | POA: Insufficient documentation

## 2020-10-01 DIAGNOSIS — E559 Vitamin D deficiency, unspecified: Secondary | ICD-10-CM | POA: Insufficient documentation

## 2020-10-01 DIAGNOSIS — E785 Hyperlipidemia, unspecified: Secondary | ICD-10-CM | POA: Insufficient documentation

## 2020-10-16 ENCOUNTER — Encounter: Payer: Medicare Other | Admitting: Vascular Surgery

## 2020-10-16 ENCOUNTER — Encounter (HOSPITAL_COMMUNITY): Payer: Medicare Other

## 2020-10-23 ENCOUNTER — Other Ambulatory Visit: Payer: Self-pay

## 2020-10-23 ENCOUNTER — Encounter: Payer: Self-pay | Admitting: Vascular Surgery

## 2020-10-23 ENCOUNTER — Ambulatory Visit: Payer: Medicare Other | Admitting: Vascular Surgery

## 2020-10-23 VITALS — BP 135/71 | HR 68 | Temp 98.0°F | Resp 20 | Ht 67.0 in | Wt 209.2 lb

## 2020-10-23 DIAGNOSIS — I739 Peripheral vascular disease, unspecified: Secondary | ICD-10-CM

## 2020-10-23 NOTE — Progress Notes (Signed)
REASON FOR CONSULT:    Peripheral vascular disease.  The consult is requested by Dr. Andi Chan.  ASSESSMENT & PLAN:   PERIPHERAL VASCULAR DISEASE: Based on her exam she has evidence of infrainguinal arterial occlusive disease bilaterally.  However currently she is really not having any significant symptoms related to her peripheral vascular disease.  She does have some arthritis in her left knee.  We have had a long discussion about the importance of tobacco cessation.  I encouraged her to stay as active as possible.  We also discussed the importance of nutrition.  If her symptoms progress and she developed disabling claudication, rest pain, or nonhealing ulcer, then certainly we could consider arteriography.  However currently she is having minimal symptoms related to her peripheral vascular disease.  I have ordered follow-up ABIs in 6 months and I will see her back at that time.  She knows to call sooner if she has problems.   Nicole Ferrari, MD Office: [phone]   HPI:   Nicole Chan is a pleasant 80 y.o. female, who was referred for evaluation of peripheral vascular disease.  The patient had been having some pain in the left leg and was seen by orthopedics and apparently does have a history of some arthritis in the left knee.  On my history I do not get any history of claudication although I think her activity is fairly limited.  She denies any history of rest pain or nonhealing ulcers.  Her risk factors for peripheral vascular disease include hypertension, hypercholesterolemia, and continued tobacco use.  She smokes anywhere from a third of a pack per day of cigarettes to half a pack per day of cigarettes and has been smoking for 55 years.  She denies any history of diabetes.  She denies any family history of premature cardiovascular disease.  She does have a history of a defibrillator and is followed by Dr. Sharrell Chan.  She also has a history of chronic systolic  congestive heart failure.  She has had no previous history of DVT and no previous history of stroke.  Past Medical History:  Diagnosis Date   Arthritis    "maybe in my hands/fingers; not terrible" (07/12/2015)   Chronic systolic CHF (congestive heart failure) (HCC)    Dilated cardiomyopathy (HCC)    Episode of dizziness, secondary to cardiomyopathy possibly  10/25/2014   Essential hypertension    Hypertensive heart disease    LBBB (left bundle branch block)    a. noted on 10/24/14 admission for dizziness    NICM (nonischemic cardiomyopathy) (HCC)    a. Cath 10/29/14: normal coronary arteries (tortuous, compatible with hypertension). b. s/p Medtronic BiV-ICD 06/2015.   Tobacco abuse     Family History  Problem Relation Age of Onset   Arrhythmia Mother    Hypertension Mother    Diabetes Mother    Heart disease Mother    Cancer Father    Lung cancer Sister    Healthy Paternal Aunt    Heart attack Neg Hx    Stroke Neg Hx     SOCIAL HISTORY: Social History   Socioeconomic History   Marital status: Widowed    Spouse name: Not on file   Number of children: 4   Years of education: Not on file   Highest education level: Not on file  Occupational History   Not on file  Tobacco Use   Smoking status: Current Every Day Smoker    Packs/day: 0.33    Years: 55.00  Pack years: 18.15    Types: Cigarettes   Smokeless tobacco: Never Used  Substance and Sexual Activity   Alcohol use: No    Alcohol/week: 0.0 standard drinks    Comment: on special occasions   Drug use: No   Sexual activity: Yes  Other Topics Concern   Not on file  Social History Narrative   Not on file   Social Determinants of Health   Financial Resource Strain: Not on file  Food Insecurity: Not on file  Transportation Needs: Not on file  Physical Activity: Not on file  Stress: Not on file  Social Connections: Not on file  Intimate Partner Violence: Not on file     Allergies  Allergen Reactions   Shellfish Allergy Itching    Current Outpatient Medications  Medication Sig Dispense Refill   carvedilol (COREG) 12.5 MG tablet TAKE 1 TABLET BY MOUTH TWICE DAILY.Marland Kitchen PLEASE KEEP APPT WITH DR/TAYLOR IN OCTOBER (Patient taking differently: Take 12.5 mg by mouth 2 (two) times daily with a meal.) 180 tablet 3   irbesartan-hydrochlorothiazide (AVALIDE) 300-12.5 MG tablet Take 1 tablet by mouth daily. 90 tablet 3   Vitamin D, Ergocalciferol, (DRISDOL) 50000 UNITS CAPS capsule Take 50,000 Units by mouth 2 (two) times a week.      atorvastatin (LIPITOR) 20 MG tablet Take 20 mg by mouth daily. (Patient not taking: Reported on 10/23/2020)     No current facility-administered medications for this visit.    REVIEW OF SYSTEMS:  [X]  denotes positive finding, [ ]  denotes negative finding Cardiac  Comments:  Chest pain or chest pressure:    Shortness of breath upon exertion:    Short of breath when lying flat:    Irregular heart rhythm:        Vascular    Pain in calf, thigh, or hip brought on by ambulation:    Pain in feet at night that wakes you up from your sleep:     Blood clot in your veins:    Leg swelling:         Pulmonary    Oxygen at home:    Productive cough:     Wheezing:         Neurologic    Sudden weakness in arms or legs:     Sudden numbness in arms or legs:     Sudden onset of difficulty speaking or slurred speech:    Temporary loss of vision in one eye:     Problems with dizziness:         Gastrointestinal    Blood in stool:     Vomited blood:         Genitourinary    Burning when urinating:     Blood in urine:        Psychiatric    Major depression:         Hematologic    Bleeding problems:    Problems with blood clotting too easily:        Skin    Rashes or ulcers:        Constitutional    Fever or chills:     PHYSICAL EXAM:   Vitals:   10/23/20 1456  BP: 135/71  Pulse: 68  Resp: 20  Temp: 98 F (36.7  C)  TempSrc: Temporal  SpO2: 95%  Weight: 209 lb 3.2 oz (94.9 kg)  Height: 5\' 7"  (1.702 m)   Body mass index is 32.77 kg/m.  GENERAL: The patient is a well-nourished female,  in no acute distress. The vital signs are documented above. CARDIAC: There is a regular rate and rhythm.  VASCULAR: I do not detect carotid bruits. She has normal femoral pulses. I cannot palpate pedal pulses. She has a monophasic peroneal and posterior tibial signal bilaterally. I cannot obtain an anterior tibial or dorsalis pedis signal on either side. Both feet appear adequately perfused. She has no significant lower extremity swelling. PULMONARY: There is good air exchange bilaterally without wheezing or rales. ABDOMEN: Soft and non-tender with normal pitched bowel sounds.  MUSCULOSKELETAL: There are no major deformities or cyanosis. NEUROLOGIC: No focal weakness or paresthesias are detected. SKIN: There are no ulcers or rashes noted. PSYCHIATRIC: The patient has a normal affect.  DATA:    I could not locate her noninvasive Doppler studies as these were done elsewhere and were not within the epic system.  I did do my own Doppler study and as noted above she has a monophasic peroneal and posterior tibial signal bilaterally.

## 2020-10-24 ENCOUNTER — Other Ambulatory Visit: Payer: Self-pay

## 2020-10-24 DIAGNOSIS — I739 Peripheral vascular disease, unspecified: Secondary | ICD-10-CM

## 2020-11-11 ENCOUNTER — Telehealth: Payer: Self-pay | Admitting: *Deleted

## 2020-11-11 DIAGNOSIS — I428 Other cardiomyopathies: Secondary | ICD-10-CM

## 2020-11-11 NOTE — Telephone Encounter (Signed)
-----   Message from Lyn Records, MD sent at 11/09/2020  5:55 PM EST ----- Regarding: ECHOCARDIOGRAM She has not had an echocardiogram in years. She needs to get echo to assess LV and current therapy. OV with me after the echo.

## 2020-11-11 NOTE — Telephone Encounter (Signed)
Spoke with pt and scheduled echo and appt with Dr. Katrinka Blazing.  Pt appreciative for call.

## 2020-11-13 ENCOUNTER — Other Ambulatory Visit: Payer: Medicare Other

## 2020-11-23 ENCOUNTER — Other Ambulatory Visit: Payer: Medicare Other

## 2020-12-02 ENCOUNTER — Ambulatory Visit (HOSPITAL_COMMUNITY): Payer: Medicare Other | Attending: Cardiology

## 2020-12-03 ENCOUNTER — Encounter (HOSPITAL_COMMUNITY): Payer: Self-pay | Admitting: Interventional Cardiology

## 2020-12-13 ENCOUNTER — Ambulatory Visit (INDEPENDENT_AMBULATORY_CARE_PROVIDER_SITE_OTHER): Payer: Medicare Other

## 2020-12-13 DIAGNOSIS — I428 Other cardiomyopathies: Secondary | ICD-10-CM

## 2020-12-16 LAB — CUP PACEART REMOTE DEVICE CHECK
Battery Remaining Longevity: 43 mo
Battery Voltage: 2.98 V
Brady Statistic AP VP Percent: 0.08 %
Brady Statistic AP VS Percent: 0.02 %
Brady Statistic AS VP Percent: 99.86 %
Brady Statistic AS VS Percent: 0.04 %
Brady Statistic RA Percent Paced: 0.1 %
Brady Statistic RV Percent Paced: 99.72 %
Date Time Interrogation Session: 20220321001605
HighPow Impedance: 65 Ohm
Implantable Lead Implant Date: 20161013
Implantable Lead Implant Date: 20161013
Implantable Lead Implant Date: 20161013
Implantable Lead Location: 753858
Implantable Lead Location: 753859
Implantable Lead Location: 753860
Implantable Lead Model: 4398
Implantable Lead Model: 5076
Implantable Pulse Generator Implant Date: 20210618
Lead Channel Impedance Value: 174.595
Lead Channel Impedance Value: 174.595
Lead Channel Impedance Value: 185.725
Lead Channel Impedance Value: 190 Ohm
Lead Channel Impedance Value: 203.256
Lead Channel Impedance Value: 323 Ohm
Lead Channel Impedance Value: 380 Ohm
Lead Channel Impedance Value: 380 Ohm
Lead Channel Impedance Value: 380 Ohm
Lead Channel Impedance Value: 437 Ohm
Lead Channel Impedance Value: 456 Ohm
Lead Channel Impedance Value: 570 Ohm
Lead Channel Impedance Value: 570 Ohm
Lead Channel Impedance Value: 589 Ohm
Lead Channel Impedance Value: 627 Ohm
Lead Channel Impedance Value: 627 Ohm
Lead Channel Impedance Value: 627 Ohm
Lead Channel Impedance Value: 646 Ohm
Lead Channel Pacing Threshold Amplitude: 0.625 V
Lead Channel Pacing Threshold Amplitude: 4.25 V
Lead Channel Pacing Threshold Pulse Width: 0.4 ms
Lead Channel Pacing Threshold Pulse Width: 1 ms
Lead Channel Sensing Intrinsic Amplitude: 12.375 mV
Lead Channel Sensing Intrinsic Amplitude: 12.375 mV
Lead Channel Sensing Intrinsic Amplitude: 2.125 mV
Lead Channel Sensing Intrinsic Amplitude: 2.125 mV
Lead Channel Setting Pacing Amplitude: 0.5 V
Lead Channel Setting Pacing Amplitude: 1.5 V
Lead Channel Setting Pacing Amplitude: 4 V
Lead Channel Setting Pacing Pulse Width: 0.03 ms
Lead Channel Setting Pacing Pulse Width: 1 ms
Lead Channel Setting Sensing Sensitivity: 0.3 mV

## 2020-12-17 ENCOUNTER — Telehealth (HOSPITAL_COMMUNITY): Payer: Self-pay | Admitting: Interventional Cardiology

## 2020-12-17 NOTE — Telephone Encounter (Signed)
Just an FYI. We have made several attempts to contact this patient including sending a letter to schedule or reschedule their echocardiogram. We will be removing the patient from the echo WQ.    12/02/20 NO SHOWED - MAILED LETTER [facility]     Thank you

## 2020-12-20 NOTE — Progress Notes (Signed)
Remote ICD transmission.   

## 2020-12-26 NOTE — Progress Notes (Deleted)
Cardiology Office Note:    Date:  12/26/2020   ID:  Nicole Chan, DOB [birth year], MRN [id number]  PCP:  Andi Devon, MD  Cardiologist:  No primary care provider on file.   Referring MD: Andi Devon, MD   No chief complaint on file.   History of Present Illness:    Nicole Chan is a 80 y.o. female with a hx of HTN, prior smoker, obesity LBBB, NICM, chronic CHF (systolic), and AICD.   ***  Past Medical History:  Diagnosis Date  . Arthritis    "maybe in my hands/fingers; not terrible" (07/12/2015)  . Chronic systolic CHF (congestive heart failure) (HCC)   . Dilated cardiomyopathy (HCC)   . Episode of dizziness, secondary to cardiomyopathy possibly  10/25/2014  . Essential hypertension   . Hypertensive heart disease   . LBBB (left bundle branch block)    a. noted on 10/24/14 admission for dizziness   . NICM (nonischemic cardiomyopathy) (HCC)    a. Cath 10/29/14: normal coronary arteries (tortuous, compatible with hypertension). b. s/p Medtronic BiV-ICD 06/2015.  . Tobacco abuse     Past Surgical History:  Procedure Laterality Date  . ABDOMINAL HYSTERECTOMY  1970's  . APPENDECTOMY    . BI-VENTRICULAR IMPLANTABLE CARDIOVERTER DEFIBRILLATOR  (CRT-D)  07/11/2015  . BIV ICD GENERATOR CHANGEOUT N/A 03/15/2020   Procedure: BIV ICD GENERATOR CHANGEOUT;  Surgeon: Marinus Maw, MD;  Location: [facility] INVASIVE CV LAB;  Service: Cardiovascular;  Laterality: N/A;  . CARDIAC CATHETERIZATION    . EP IMPLANTABLE DEVICE N/A 07/11/2015   Procedure: BiV ICD Insertion CRT-D;  Surgeon: Marinus Maw, MD;  Location: [facility] INVASIVE CV LAB;  Service: Cardiovascular;  Laterality: N/A;  . LEFT HEART CATHETERIZATION WITH CORONARY ANGIOGRAM N/A 10/29/2014   Procedure: LEFT HEART CATHETERIZATION WITH CORONARY ANGIOGRAM;  Surgeon: Lesleigh Noe, MD;  Location: [facility] CATH LAB;  Service: Cardiovascular;  Laterality: N/A;  . TONSILLECTOMY    . TUBAL LIGATION  1970's    Current  Medications: No outpatient medications have been marked as taking for the 12/27/20 encounter (Appointment) with Lyn Records, MD.     Allergies:   Shellfish allergy   Social History   Socioeconomic History  . Marital status: Widowed    Spouse name: Not on file  . Number of children: 4  . Years of education: Not on file  . Highest education level: Not on file  Occupational History  . Not on file  Tobacco Use  . Smoking status: Current Every Day Smoker    Packs/day: 0.33    Years: 55.00    Pack years: 18.15    Types: Cigarettes  . Smokeless tobacco: Never Used  Substance and Sexual Activity  . Alcohol use: No    Alcohol/week: 0.0 standard drinks    Comment: on special occasions  . Drug use: No  . Sexual activity: Yes  Other Topics Concern  . Not on file  Social History Narrative  . Not on file   Social Determinants of Health   Financial Resource Strain: Not on file  Food Insecurity: Not on file  Transportation Needs: Not on file  Physical Activity: Not on file  Stress: Not on file  Social Connections: Not on file     Family History: The patient's family history includes Arrhythmia in her mother; Cancer in her father; Diabetes in her mother; Healthy in her paternal aunt; Heart disease in her mother; Hypertension in her mother; Lung cancer in her sister. There is  no history of Heart attack or Stroke.  ROS:   Please see the history of present illness.    *** All other systems reviewed and are negative.  EKGs/Labs/Other Studies Reviewed:    The following studies were reviewed today: ECHOCARDIOGRAM 2016: Study Conclusions   - Left ventricle: The cavity size was normal. Wall thickness was  increased in a pattern of moderate LVH. Systolic function was  severely reduced. The estimated ejection fraction was in the  range of 25% to 30%. Septal-lateral dyssynchrony with severe  septal hypokinesis. Relative preservation of lateral wall  function. Doppler  parameters are consistent with abnormal left  ventricular relaxation (grade 1 diastolic dysfunction). Abnormal  strain pattern, especially in lateral wall. GLS -11%.  - Aortic valve: Trileaflet; moderately calcified leaflets. There  was no stenosis. There was mild regurgitation.  - Mitral valve: Moderately calcified annulus. Mildly calcified  leaflets . There was no significant regurgitation.  - Left atrium: The atrium was at the upper limits of normal in  size.  - Right ventricle: The cavity size was normal. Systolic function  was normal.  - Tricuspid valve: Peak RV-RA gradient (S): 23 mm Hg.  - Pulmonary arteries: PA peak pressure: 26 mm Hg (S).  - Inferior vena cava: The vessel was normal in size. The  respirophasic diameter changes were in the normal range (= 50%),  consistent with normal central venous pressure.   Impressions:   - Normal LV size with moderate LV hypertrophy. EF 25-30% with  septal-lateral dyssynchrony and severe septal hypokinesis  (relatively preserved lateral wall function). Normal RV size and  systolic function. Mild AI.   EKG:  EKG ***  Recent Labs: 03/12/2020: BUN 19; Creatinine, Ser 0.96; Hemoglobin 13.4; Platelets 240; Potassium 3.7; Sodium 137  Recent Lipid Panel No results found for: CHOL, TRIG, HDL, CHOLHDL, VLDL, LDLCALC, LDLDIRECT  Physical Exam:    VS:  There were no vitals taken for this visit.    Wt Readings from Last 3 Encounters:  10/23/20 209 lb 3.2 oz (94.9 kg)  06/17/20 211 lb (95.7 kg)  03/15/20 210 lb (95.3 kg)     GEN: ***. No acute distress HEENT: Normal NECK: No JVD. LYMPHATICS: No lymphadenopathy CARDIAC: *** murmur. RRR *** gallop, or edema. VASCULAR: *** Normal Pulses. No bruits. RESPIRATORY:  Clear to auscultation without rales, wheezing or rhonchi  ABDOMEN: Soft, non-tender, non-distended, No pulsatile mass, MUSCULOSKELETAL: No deformity  SKIN: Warm and dry NEUROLOGIC:  Alert and oriented x  3 PSYCHIATRIC:  Normal affect   ASSESSMENT:    1. NICM (nonischemic cardiomyopathy) (HCC)   2. Chronic systolic heart failure (HCC)   3. ICD (implantable cardioverter-defibrillator), biventricular, in situ   4. Essential hypertension   5. Biventricular implantable cardioverter-defibrillator in situ    PLAN:    In order of problems listed above:  1. ***   Medication Adjustments/Labs and Tests Ordered: Current medicines are reviewed at length with the patient today.  Concerns regarding medicines are outlined above.  No orders of the defined types were placed in this encounter.  No orders of the defined types were placed in this encounter.   There are no Patient Instructions on file for this visit.   Signed, Lesleigh Noe, MD  12/26/2020 5:29 PM    [facility]

## 2020-12-27 ENCOUNTER — Ambulatory Visit: Payer: Medicare Other | Admitting: Interventional Cardiology

## 2020-12-27 DIAGNOSIS — I428 Other cardiomyopathies: Secondary | ICD-10-CM

## 2020-12-27 DIAGNOSIS — I10 Essential (primary) hypertension: Secondary | ICD-10-CM

## 2020-12-27 DIAGNOSIS — Z95810 Presence of automatic (implantable) cardiac defibrillator: Secondary | ICD-10-CM

## 2020-12-27 DIAGNOSIS — I5022 Chronic systolic (congestive) heart failure: Secondary | ICD-10-CM

## 2021-03-14 ENCOUNTER — Ambulatory Visit (INDEPENDENT_AMBULATORY_CARE_PROVIDER_SITE_OTHER): Payer: Medicare Other

## 2021-03-14 DIAGNOSIS — I428 Other cardiomyopathies: Secondary | ICD-10-CM | POA: Diagnosis not present

## 2021-03-14 DIAGNOSIS — I5022 Chronic systolic (congestive) heart failure: Secondary | ICD-10-CM

## 2021-03-14 LAB — CUP PACEART REMOTE DEVICE CHECK
Battery Remaining Longevity: 38 mo
Battery Voltage: 2.98 V
Brady Statistic AP VP Percent: 0.09 %
Brady Statistic AP VS Percent: 0.03 %
Brady Statistic AS VP Percent: 99.85 %
Brady Statistic AS VS Percent: 0.03 %
Brady Statistic RA Percent Paced: 0.11 %
Brady Statistic RV Percent Paced: 99.82 %
Date Time Interrogation Session: 20220617082604
HighPow Impedance: 69 Ohm
Implantable Lead Implant Date: 20161013
Implantable Lead Implant Date: 20161013
Implantable Lead Implant Date: 20161013
Implantable Lead Location: 753858
Implantable Lead Location: 753859
Implantable Lead Location: 753860
Implantable Lead Model: 4398
Implantable Lead Model: 5076
Implantable Pulse Generator Implant Date: 20210618
Lead Channel Impedance Value: 166.114
Lead Channel Impedance Value: 178.5 Ohm
Lead Channel Impedance Value: 189.073
Lead Channel Impedance Value: 195.429
Lead Channel Impedance Value: 212.8 Ohm
Lead Channel Impedance Value: 323 Ohm
Lead Channel Impedance Value: 342 Ohm
Lead Channel Impedance Value: 399 Ohm
Lead Channel Impedance Value: 399 Ohm
Lead Channel Impedance Value: 437 Ohm
Lead Channel Impedance Value: 456 Ohm
Lead Channel Impedance Value: 513 Ohm
Lead Channel Impedance Value: 513 Ohm
Lead Channel Impedance Value: 532 Ohm
Lead Channel Impedance Value: 589 Ohm
Lead Channel Impedance Value: 627 Ohm
Lead Channel Impedance Value: 627 Ohm
Lead Channel Impedance Value: 665 Ohm
Lead Channel Pacing Threshold Amplitude: 0.75 V
Lead Channel Pacing Threshold Amplitude: 4.5 V
Lead Channel Pacing Threshold Pulse Width: 0.4 ms
Lead Channel Pacing Threshold Pulse Width: 1 ms
Lead Channel Sensing Intrinsic Amplitude: 13.125 mV
Lead Channel Sensing Intrinsic Amplitude: 13.125 mV
Lead Channel Sensing Intrinsic Amplitude: 2.5 mV
Lead Channel Sensing Intrinsic Amplitude: 2.5 mV
Lead Channel Setting Pacing Amplitude: 0.5 V
Lead Channel Setting Pacing Amplitude: 1.5 V
Lead Channel Setting Pacing Amplitude: 4 V
Lead Channel Setting Pacing Pulse Width: 0.03 ms
Lead Channel Setting Pacing Pulse Width: 1 ms
Lead Channel Setting Sensing Sensitivity: 0.3 mV

## 2021-04-02 NOTE — Progress Notes (Addendum)
Remote ICD transmission.   

## 2021-04-03 ENCOUNTER — Emergency Department (HOSPITAL_COMMUNITY): Payer: Medicare Other

## 2021-04-03 ENCOUNTER — Other Ambulatory Visit: Payer: Self-pay

## 2021-04-03 ENCOUNTER — Encounter (HOSPITAL_COMMUNITY): Payer: Self-pay | Admitting: Emergency Medicine

## 2021-04-03 ENCOUNTER — Emergency Department (HOSPITAL_COMMUNITY)
Admission: EM | Admit: 2021-04-03 | Discharge: 2021-04-03 | Disposition: A | Discharge status: Home / Self Care | Payer: Medicare Other | Attending: Emergency Medicine | Admitting: Emergency Medicine

## 2021-04-03 DIAGNOSIS — D649 Anemia, unspecified: Secondary | ICD-10-CM

## 2021-04-03 DIAGNOSIS — R112 Nausea with vomiting, unspecified: Secondary | ICD-10-CM | POA: Insufficient documentation

## 2021-04-03 DIAGNOSIS — F17210 Nicotine dependence, cigarettes, uncomplicated: Secondary | ICD-10-CM | POA: Diagnosis not present

## 2021-04-03 DIAGNOSIS — E876 Hypokalemia: Secondary | ICD-10-CM | POA: Insufficient documentation

## 2021-04-03 DIAGNOSIS — Z955 Presence of coronary angioplasty implant and graft: Secondary | ICD-10-CM | POA: Insufficient documentation

## 2021-04-03 DIAGNOSIS — Z79899 Other long term (current) drug therapy: Secondary | ICD-10-CM | POA: Diagnosis not present

## 2021-04-03 DIAGNOSIS — I110 Hypertensive heart disease with heart failure: Secondary | ICD-10-CM | POA: Diagnosis not present

## 2021-04-03 DIAGNOSIS — R42 Dizziness and giddiness: Secondary | ICD-10-CM | POA: Diagnosis present

## 2021-04-03 DIAGNOSIS — I5022 Chronic systolic (congestive) heart failure: Secondary | ICD-10-CM | POA: Insufficient documentation

## 2021-04-03 LAB — CBC WITH DIFFERENTIAL/PLATELET
Abs Immature Granulocytes: 0.02 10*3/uL (ref 0.00–0.07)
Basophils Absolute: 0 10*3/uL (ref 0.0–0.1)
Basophils Relative: 0 %
Eosinophils Absolute: 0.1 10*3/uL (ref 0.0–0.5)
Eosinophils Relative: 1 %
HCT: 28.8 % — ABNORMAL LOW (ref 36.0–46.0)
Hemoglobin: 9.3 g/dL — ABNORMAL LOW (ref 12.0–15.0)
Immature Granulocytes: 0 %
Lymphocytes Relative: 16 %
Lymphs Abs: 1 10*3/uL (ref 0.7–4.0)
MCH: 30.3 pg (ref 26.0–34.0)
MCHC: 32.3 g/dL (ref 30.0–36.0)
MCV: 93.8 fL (ref 80.0–100.0)
Monocytes Absolute: 0.4 10*3/uL (ref 0.1–1.0)
Monocytes Relative: 7 %
Neutro Abs: 4.4 10*3/uL (ref 1.7–7.7)
Neutrophils Relative %: 76 %
Platelets: 153 10*3/uL (ref 150–400)
RBC: 3.07 MIL/uL — ABNORMAL LOW (ref 3.87–5.11)
RDW: 13.2 % (ref 11.5–15.5)
WBC: 5.9 10*3/uL (ref 4.0–10.5)
nRBC: 0 % (ref 0.0–0.2)

## 2021-04-03 LAB — COMPREHENSIVE METABOLIC PANEL
ALT: 10 U/L (ref 0–44)
AST: 13 U/L — ABNORMAL LOW (ref 15–41)
Albumin: 1.8 g/dL — ABNORMAL LOW (ref 3.5–5.0)
Alkaline Phosphatase: 33 U/L — ABNORMAL LOW (ref 38–126)
Anion gap: 2 — ABNORMAL LOW (ref 5–15)
BUN: 11 mg/dL (ref 8–23)
CO2: 16 mmol/L — ABNORMAL LOW (ref 22–32)
Calcium: 5.4 mg/dL — CL (ref 8.9–10.3)
Chloride: 125 mmol/L — ABNORMAL HIGH (ref 98–111)
Creatinine, Ser: 0.48 mg/dL (ref 0.44–1.00)
GFR, Estimated: >60 mL/min (ref >60)
Glucose, Bld: 72 mg/dL (ref 70–99)
Potassium: 2.1 mmol/L — CL (ref 3.5–5.1)
Sodium: 143 mmol/L (ref 135–145)
Total Bilirubin: 0.7 mg/dL (ref 0.3–1.2)
Total Protein: 3.5 g/dL — ABNORMAL LOW (ref 6.5–8.1)

## 2021-04-03 LAB — URINALYSIS, ROUTINE W REFLEX MICROSCOPIC
Bilirubin Urine: NEGATIVE
Glucose, UA: NEGATIVE mg/dL
Hgb urine dipstick: NEGATIVE
Ketones, ur: NEGATIVE mg/dL
Leukocytes,Ua: NEGATIVE
Nitrite: NEGATIVE
Protein, ur: NEGATIVE mg/dL
Specific Gravity, Urine: 1.02 (ref 1.005–1.030)
pH: 6 (ref 5.0–8.0)

## 2021-04-03 LAB — POC OCCULT BLOOD, ED: Fecal Occult Bld: NEGATIVE

## 2021-04-03 LAB — MAGNESIUM: Magnesium: 1 mg/dL — ABNORMAL LOW (ref 1.7–2.4)

## 2021-04-03 MED ORDER — CALCIUM GLUCONATE-NACL 1-0.675 GM/50ML-% IV SOLN
1.0000 g | Freq: Once | INTRAVENOUS | Status: AC
  Administered 2021-04-03: 1000 mg via INTRAVENOUS
  Filled 2021-04-03: qty 50

## 2021-04-03 MED ORDER — SODIUM CHLORIDE 0.9 % IV BOLUS
1000.0000 mL | Freq: Once | INTRAVENOUS | Status: AC
  Administered 2021-04-03: 1000 mL via INTRAVENOUS

## 2021-04-03 MED ORDER — POTASSIUM CHLORIDE CRYS ER 20 MEQ PO TBCR
40.0000 meq | EXTENDED_RELEASE_TABLET | Freq: Once | ORAL | Status: AC
  Administered 2021-04-03: 40 meq via ORAL
  Filled 2021-04-03: qty 2

## 2021-04-03 MED ORDER — IRBESARTAN 300 MG PO TABS: 300.0000 mg | ORAL_TABLET | Freq: Every day | ORAL | 0 refills | Status: DC

## 2021-04-03 MED ORDER — MAGNESIUM SULFATE 2 GM/50ML IV SOLN
2.0000 g | Freq: Once | INTRAVENOUS | Status: AC
  Administered 2021-04-03: 2 g via INTRAVENOUS
  Filled 2021-04-03: qty 50

## 2021-04-03 NOTE — ED Notes (Signed)
RN performed interrogation of pacemaker

## 2021-04-03 NOTE — ED Triage Notes (Signed)
Patient arrives via EMS from her work place.  Patient had onset of dizziness with 2 episodes of vomiting today.  Patient denies any type of chest pain or discomfort.  EMS reports that the dizziness has subsided after getting IV bolus of 400 mls normal saline and zofran

## 2021-04-03 NOTE — ED Notes (Signed)
RN/provider informed of abnormal labs

## 2021-04-03 NOTE — ED Provider Notes (Addendum)
[facility]-EMERGENCY DEPT Provider Note   CSN: [id number] Arrival date & time: 04/03/21  1448     History Chief Complaint  Patient presents with   Dizziness    Nicole Chan is a 80 y.o. female.  Pt presents to the ED today with dizziness and 2 episodes of vomiting.  Pt said she has not eaten much today.  She was at work [employer]) today and felt these sx.  She denies any cp or sob.  She was given 400 cc NS and 4 mg Zofran en route by EMS.  She said she is feeling better now.      Past Medical History:  Diagnosis Date   Arthritis    "maybe in my hands/fingers; not terrible" (07/12/2015)   Chronic systolic CHF (congestive heart failure) (HCC)    Dilated cardiomyopathy (HCC)    Episode of dizziness, secondary to cardiomyopathy possibly  10/25/2014   Essential hypertension    Hypertensive heart disease    LBBB (left bundle branch block)    a. noted on 10/24/14 admission for dizziness    NICM (nonischemic cardiomyopathy) (HCC)    a. Cath 10/29/14: normal coronary arteries (tortuous, compatible with hypertension). b. s/p Medtronic BiV-ICD 06/2015.   Tobacco abuse     Patient Active Problem List   Diagnosis Date Noted   Abnormal glucose level 10/01/2020   Colon cancer screening 10/01/2020   Flatulence, eructation and gas pain 10/01/2020   Hyperlipidemia 10/01/2020   Hypertensive heart disease 10/01/2020   Obesity with body mass index 30 or greater 10/01/2020   Osteoarthritis of knee 10/01/2020   Vitamin Nicole deficiency 10/01/2020   Near syncope 10/15/2015   Hypotension 10/15/2015   NICM (nonischemic cardiomyopathy) (HCC) 10/15/2015   Chronic systolic CHF (congestive heart failure) (HCC) 10/15/2015   Tobacco abuse 10/15/2015   Hyperglycemia 10/15/2015   ICD (implantable cardioverter-defibrillator), biventricular, in situ 10/11/2015   Hypertensive heart disease with CHF (HCC) 06/05/2015   Chronic systolic heart failure (HCC) 11/12/2014    Dizziness 11/12/2014   Tobacco use 10/26/2014   Essential hypertension 10/25/2014   LBBB (left bundle branch block)- new 10/24/2014    Past Surgical History:  Procedure Laterality Date   ABDOMINAL HYSTERECTOMY  1970's   APPENDECTOMY     BI-VENTRICULAR IMPLANTABLE CARDIOVERTER DEFIBRILLATOR  (CRT-Nicole)  07/11/2015   BIV ICD GENERATOR CHANGEOUT N/A 03/15/2020   Procedure: BIV ICD GENERATOR CHANGEOUT;  Surgeon: Marinus Maw, MD;  Location: [facility] INVASIVE CV LAB;  Service: Cardiovascular;  Laterality: N/A;   CARDIAC CATHETERIZATION     EP IMPLANTABLE DEVICE N/A 07/11/2015   Procedure: BiV ICD Insertion CRT-Nicole;  Surgeon: Marinus Maw, MD;  Location: [facility] INVASIVE CV LAB;  Service: Cardiovascular;  Laterality: N/A;   LEFT HEART CATHETERIZATION WITH CORONARY ANGIOGRAM N/A 10/29/2014   Procedure: LEFT HEART CATHETERIZATION WITH CORONARY ANGIOGRAM;  Surgeon: Lesleigh Noe, MD;  Location: [facility] CATH LAB;  Service: Cardiovascular;  Laterality: N/A;   TONSILLECTOMY     TUBAL LIGATION  1970's     OB History   No obstetric history on file.     Family History  Problem Relation Age of Onset   Arrhythmia Mother    Hypertension Mother    Diabetes Mother    Heart disease Mother    Cancer Father    Lung cancer Sister    Healthy Paternal Aunt    Heart attack Neg Hx    Stroke Neg Hx     Social History  Tobacco Use   Smoking status: Every Day    Packs/day: 0.33    Years: 55.00    Pack years: 18.15    Types: Cigarettes   Smokeless tobacco: Never  Substance Use Topics   Alcohol use: No    Alcohol/week: 0.0 standard drinks    Comment: on special occasions   Drug use: No    Home Medications Prior to Admission medications   Medication Sig Start Date End Date Taking? Authorizing Provider  irbesartan (AVAPRO) 300 MG tablet Take 1 tablet (300 mg total) by mouth daily. 04/03/21  Yes Jacalyn Lefevre, MD  atorvastatin (LIPITOR) 20 MG tablet Take 20 mg by mouth daily. Patient not taking: Reported  on 10/23/2020    [provider]  carvedilol (COREG) 12.5 MG tablet TAKE 1 TABLET BY MOUTH TWICE DAILY.Marland Kitchen PLEASE KEEP APPT WITH DR/TAYLOR IN OCTOBER Patient taking differently: Take 12.5 mg by mouth 2 (two) times daily with a meal. 12/12/19   Marinus Maw, MD  Vitamin Nicole, Ergocalciferol, (DRISDOL) 50000 UNITS CAPS capsule Take 50,000 Units by mouth 2 (two) times a week.     [provider]    Allergies    Shellfish allergy  Review of Systems   Review of Systems  Gastrointestinal:  Positive for nausea and vomiting.  Neurological:  Positive for dizziness.  All other systems reviewed and are negative.  Physical Exam Updated Vital Signs BP (!) 153/129   Pulse 63   Temp 97.8 F (36.6 C) (Oral)   Resp 16   Ht 5\' 7"  (1.702 m)   Wt 95.3 kg   SpO2 97%   BMI 32.89 kg/m   Physical Exam Vitals and nursing note reviewed.  Constitutional:      Appearance: Normal appearance.  HENT:     Head: Normocephalic and atraumatic.     Right Ear: External ear normal.     Left Ear: External ear normal.     Nose: Nose normal.     Mouth/Throat:     Mouth: Mucous membranes are moist.     Pharynx: Oropharynx is clear.  Eyes:     Extraocular Movements: Extraocular movements intact.     Conjunctiva/sclera: Conjunctivae normal.     Pupils: Pupils are equal, round, and reactive to light.  Cardiovascular:     Rate and Rhythm: Normal rate and regular rhythm.     Pulses: Normal pulses.     Heart sounds: Normal heart sounds.  Pulmonary:     Effort: Pulmonary effort is normal.     Breath sounds: Normal breath sounds.  Abdominal:     General: Abdomen is flat. Bowel sounds are normal.     Palpations: Abdomen is soft.  Musculoskeletal:        General: Normal range of motion.     Cervical back: Normal range of motion and neck supple.  Skin:    General: Skin is warm.     Capillary Refill: Capillary refill takes less than 2 seconds.  Neurological:     General: No focal deficit  present.     Mental Status: She is alert and oriented to person, place, and time.  Psychiatric:        Mood and Affect: Mood normal.        Behavior: Behavior normal.        Thought Content: Thought content normal.        Judgment: Judgment normal.    ED Results / Procedures / Treatments   Labs (all labs ordered are  listed, but only abnormal results are displayed) Labs Reviewed  COMPREHENSIVE METABOLIC PANEL - Abnormal; Notable for the following components:      Result Value   Potassium 2.1 (*)    Chloride 125 (*)    CO2 16 (*)    Calcium 5.4 (*)    Total Protein 3.5 (*)    Albumin 1.8 (*)    AST 13 (*)    Alkaline Phosphatase 33 (*)    Anion gap 2 (*)    All other components within normal limits  CBC WITH DIFFERENTIAL/PLATELET - Abnormal; Notable for the following components:   RBC 3.07 (*)    Hemoglobin 9.3 (*)    HCT 28.8 (*)    All other components within normal limits  MAGNESIUM - Abnormal; Notable for the following components:   Magnesium 1.0 (*)    All other components within normal limits  URINALYSIS, ROUTINE W REFLEX MICROSCOPIC  POC OCCULT BLOOD, ED    EKG EKG Interpretation  Date/Time:  Thursday April 03 2021 15:48:18 EDT Ventricular Rate:  60 PR Interval:  178 QRS Duration: 143 QT Interval:  449 QTC Calculation: 449 R Axis:   21 Text Interpretation: Atrial-sensed ventricular-paced rhythm No further analysis attempted due to paced rhythm No significant change since last tracing Confirmed by Jacalyn Lefevre [phone]) on 04/03/2021 4:05:30 PM  Radiology DG Chest Portable 1 View  Result Date: 04/03/2021 CLINICAL DATA:  Dizziness EXAM: PORTABLE CHEST 1 VIEW COMPARISON:  07/12/2015 FINDINGS: Left-sided pacing device as before. No focal opacity or pleural effusion. Mild cardiomegaly with aortic atherosclerosis. No pneumothorax IMPRESSION: No active disease.  Low lung volumes Electronically Signed   By: Jasmine Pang M.Nicole.   On: 04/03/2021 17:06     Procedures Procedures   Medications Ordered in ED Medications  sodium chloride 0.9 % bolus 1,000 mL (0 mLs Intravenous Stopped 04/03/21 1828)  potassium chloride SA (KLOR-CON) CR tablet 40 mEq (40 mEq Oral Given 04/03/21 1707)  calcium gluconate 1 g/ 50 mL sodium chloride IVPB (0 g Intravenous Stopped 04/03/21 1828)  magnesium sulfate IVPB 2 g 50 mL (2 g Intravenous New Bag/Given 04/03/21 1829)    ED Course  I have reviewed the triage vital signs and the nursing notes.  Pertinent labs & imaging results that were available during my care of the patient were reviewed by me and considered in my medical decision making (see chart for details).    MDM Rules/Calculators/A&P                          Hemoglobin down to 9.3 from 13.4 in June 2021.  She denies any black or bloody stool.  She has never had a colonoscopy.  She has done the cologuard, but it came back inconclusive.  MCV is nl.  Stool guaiac negative.  Pt has multiple electrolyte abnormalities.  Low K, Mg, and Ca.  These are all replaced.  I am going to change her bp meds to get rid of the hctz.  Pacemaker interrogated.  Report in Media.  Nothing this am.  VT in June.  No shock.  She does not recall feeling bad a month ago.  Pt is feeling much better.  She has good follow up and her daughter will take her home.  Pt is stable for Nicole/c.  Return if worse.  Final Clinical Impression(s) / ED Diagnoses Final diagnoses:  Dizziness  Anemia, unspecified type  Hypokalemia  Hypomagnesemia  Hypocalcemia    Rx /  DC Orders ED Discharge Orders          Ordered    irbesartan (AVAPRO) 300 MG tablet  Daily        04/03/21 1752             Jacalyn Lefevre, MD 04/03/21 Brooke Pace    Jacalyn Lefevre, MD 04/03/21 2112

## 2021-04-03 NOTE — Discharge Instructions (Addendum)
I am changing your blood pressure medication from the irbesartan-hctz to just irbesartan.  I think the hctz is causing some of your electrolyte abnormalities.  You will need to follow up with your pcp and get your hemoglobin and electrolytes rechecked.

## 2021-05-12 ENCOUNTER — Other Ambulatory Visit: Payer: Medicare Other

## 2021-06-13 ENCOUNTER — Ambulatory Visit (INDEPENDENT_AMBULATORY_CARE_PROVIDER_SITE_OTHER): Payer: Medicare Other

## 2021-06-13 DIAGNOSIS — I428 Other cardiomyopathies: Secondary | ICD-10-CM

## 2021-06-13 DIAGNOSIS — I5022 Chronic systolic (congestive) heart failure: Secondary | ICD-10-CM

## 2021-06-16 LAB — CUP PACEART REMOTE DEVICE CHECK
Battery Remaining Longevity: 35 mo
Battery Voltage: 2.98 V
Brady Statistic AP VP Percent: 0.37 %
Brady Statistic AP VS Percent: 0.02 %
Brady Statistic AS VP Percent: 99.58 %
Brady Statistic AS VS Percent: 0.03 %
Brady Statistic RA Percent Paced: 0.39 %
Brady Statistic RV Percent Paced: 99.87 %
Date Time Interrogation Session: 20220916162403
HighPow Impedance: 73 Ohm
Implantable Lead Implant Date: 20161013
Implantable Lead Implant Date: 20161013
Implantable Lead Implant Date: 20161013
Implantable Lead Location: 753858
Implantable Lead Location: 753859
Implantable Lead Location: 753860
Implantable Lead Model: 4398
Implantable Lead Model: 5076
Implantable Pulse Generator Implant Date: 20210618
Lead Channel Impedance Value: 160.941
Lead Channel Impedance Value: 168.889
Lead Channel Impedance Value: 172.541
Lead Channel Impedance Value: 180 Ohm
Lead Channel Impedance Value: 194.634
Lead Channel Impedance Value: 304 Ohm
Lead Channel Impedance Value: 342 Ohm
Lead Channel Impedance Value: 380 Ohm
Lead Channel Impedance Value: 380 Ohm
Lead Channel Impedance Value: 380 Ohm
Lead Channel Impedance Value: 399 Ohm
Lead Channel Impedance Value: 532 Ohm
Lead Channel Impedance Value: 570 Ohm
Lead Channel Impedance Value: 589 Ohm
Lead Channel Impedance Value: 589 Ohm
Lead Channel Impedance Value: 627 Ohm
Lead Channel Impedance Value: 627 Ohm
Lead Channel Impedance Value: 665 Ohm
Lead Channel Pacing Threshold Amplitude: 0.625 V
Lead Channel Pacing Threshold Amplitude: 4 V
Lead Channel Pacing Threshold Pulse Width: 0.4 ms
Lead Channel Pacing Threshold Pulse Width: 1 ms
Lead Channel Sensing Intrinsic Amplitude: 13.375 mV
Lead Channel Sensing Intrinsic Amplitude: 13.375 mV
Lead Channel Sensing Intrinsic Amplitude: 2.875 mV
Lead Channel Sensing Intrinsic Amplitude: 2.875 mV
Lead Channel Setting Pacing Amplitude: 0.5 V
Lead Channel Setting Pacing Amplitude: 1.5 V
Lead Channel Setting Pacing Amplitude: 4 V
Lead Channel Setting Pacing Pulse Width: 0.03 ms
Lead Channel Setting Pacing Pulse Width: 1 ms
Lead Channel Setting Sensing Sensitivity: 0.3 mV

## 2021-06-18 NOTE — Progress Notes (Signed)
Remote ICD transmission.   

## 2021-08-12 ENCOUNTER — Other Ambulatory Visit: Payer: Self-pay

## 2021-08-12 MED ORDER — CARVEDILOL 12.5 MG PO TABS: 12.5000 mg | ORAL_TABLET | Freq: Two times a day (BID) | ORAL | 0 refills | Status: DC

## 2021-09-08 ENCOUNTER — Other Ambulatory Visit: Payer: Self-pay

## 2021-09-08 MED ORDER — CARVEDILOL 12.5 MG PO TABS: 12.5000 mg | ORAL_TABLET | Freq: Two times a day (BID) | ORAL | 0 refills | Status: DC

## 2021-09-12 ENCOUNTER — Ambulatory Visit (INDEPENDENT_AMBULATORY_CARE_PROVIDER_SITE_OTHER): Payer: Medicare Other

## 2021-09-12 DIAGNOSIS — I428 Other cardiomyopathies: Secondary | ICD-10-CM

## 2021-09-15 LAB — CUP PACEART REMOTE DEVICE CHECK
Battery Remaining Longevity: 33 mo
Battery Voltage: 2.96 V
Brady Statistic AP VP Percent: 0.25 %
Brady Statistic AP VS Percent: 0.02 %
Brady Statistic AS VP Percent: 99.69 %
Brady Statistic AS VS Percent: 0.03 %
Brady Statistic RA Percent Paced: 0.28 %
Brady Statistic RV Percent Paced: 99.87 %
Date Time Interrogation Session: 20221217003307
HighPow Impedance: 73 Ohm
Implantable Lead Implant Date: 20161013
Implantable Lead Implant Date: 20161013
Implantable Lead Implant Date: 20161013
Implantable Lead Location: 753858
Implantable Lead Location: 753859
Implantable Lead Location: 753860
Implantable Lead Model: 4398
Implantable Lead Model: 5076
Implantable Pulse Generator Implant Date: 20210618
Lead Channel Impedance Value: 174.595
Lead Channel Impedance Value: 174.595
Lead Channel Impedance Value: 178.5 Ohm
Lead Channel Impedance Value: 190 Ohm
Lead Channel Impedance Value: 194.634
Lead Channel Impedance Value: 323 Ohm
Lead Channel Impedance Value: 380 Ohm
Lead Channel Impedance Value: 380 Ohm
Lead Channel Impedance Value: 399 Ohm
Lead Channel Impedance Value: 437 Ohm
Lead Channel Impedance Value: 494 Ohm
Lead Channel Impedance Value: 532 Ohm
Lead Channel Impedance Value: 532 Ohm
Lead Channel Impedance Value: 589 Ohm
Lead Channel Impedance Value: 589 Ohm
Lead Channel Impedance Value: 627 Ohm
Lead Channel Impedance Value: 627 Ohm
Lead Channel Impedance Value: 665 Ohm
Lead Channel Pacing Threshold Amplitude: 0.625 V
Lead Channel Pacing Threshold Amplitude: 3.75 V
Lead Channel Pacing Threshold Pulse Width: 0.4 ms
Lead Channel Pacing Threshold Pulse Width: 1 ms
Lead Channel Sensing Intrinsic Amplitude: 12.375 mV
Lead Channel Sensing Intrinsic Amplitude: 12.375 mV
Lead Channel Sensing Intrinsic Amplitude: 2.375 mV
Lead Channel Sensing Intrinsic Amplitude: 2.375 mV
Lead Channel Setting Pacing Amplitude: 0.5 V
Lead Channel Setting Pacing Amplitude: 1.5 V
Lead Channel Setting Pacing Amplitude: 4 V
Lead Channel Setting Pacing Pulse Width: 0.03 ms
Lead Channel Setting Pacing Pulse Width: 1 ms
Lead Channel Setting Sensing Sensitivity: 0.3 mV

## 2021-09-24 NOTE — Progress Notes (Signed)
Remote ICD transmission.   

## 2021-12-09 ENCOUNTER — Other Ambulatory Visit: Payer: Self-pay | Admitting: Internal Medicine

## 2021-12-09 DIAGNOSIS — F17200 Nicotine dependence, unspecified, uncomplicated: Secondary | ICD-10-CM

## 2021-12-10 ENCOUNTER — Other Ambulatory Visit: Payer: Self-pay | Admitting: Internal Medicine

## 2021-12-10 DIAGNOSIS — E2839 Other primary ovarian failure: Secondary | ICD-10-CM

## 2021-12-12 ENCOUNTER — Ambulatory Visit (INDEPENDENT_AMBULATORY_CARE_PROVIDER_SITE_OTHER): Payer: Medicare Other

## 2021-12-12 DIAGNOSIS — I428 Other cardiomyopathies: Secondary | ICD-10-CM | POA: Diagnosis not present

## 2021-12-12 LAB — CUP PACEART REMOTE DEVICE CHECK
Battery Remaining Longevity: 31 mo
Battery Voltage: 2.96 V
Brady Statistic AP VP Percent: 0.38 %
Brady Statistic AP VS Percent: 0.03 %
Brady Statistic AS VP Percent: 99.55 %
Brady Statistic AS VS Percent: 0.04 %
Brady Statistic RA Percent Paced: 0.4 %
Brady Statistic RV Percent Paced: 99.85 %
Date Time Interrogation Session: 20230317033525
HighPow Impedance: 68 Ohm
Implantable Lead Implant Date: 20161013
Implantable Lead Implant Date: 20161013
Implantable Lead Implant Date: 20161013
Implantable Lead Location: 753858
Implantable Lead Location: 753859
Implantable Lead Location: 753860
Implantable Lead Model: 4398
Implantable Lead Model: 5076
Implantable Pulse Generator Implant Date: 20210618
Lead Channel Impedance Value: 166.114
Lead Channel Impedance Value: 174.595
Lead Channel Impedance Value: 174.595
Lead Channel Impedance Value: 180 Ohm
Lead Channel Impedance Value: 190 Ohm
Lead Channel Impedance Value: 323 Ohm
Lead Channel Impedance Value: 342 Ohm
Lead Channel Impedance Value: 380 Ohm
Lead Channel Impedance Value: 380 Ohm
Lead Channel Impedance Value: 380 Ohm
Lead Channel Impedance Value: 456 Ohm
Lead Channel Impedance Value: 532 Ohm
Lead Channel Impedance Value: 532 Ohm
Lead Channel Impedance Value: 570 Ohm
Lead Channel Impedance Value: 627 Ohm
Lead Channel Impedance Value: 627 Ohm
Lead Channel Impedance Value: 627 Ohm
Lead Channel Impedance Value: 646 Ohm
Lead Channel Pacing Threshold Amplitude: 0.625 V
Lead Channel Pacing Threshold Amplitude: 3.5 V
Lead Channel Pacing Threshold Pulse Width: 0.4 ms
Lead Channel Pacing Threshold Pulse Width: 1 ms
Lead Channel Sensing Intrinsic Amplitude: 12.875 mV
Lead Channel Sensing Intrinsic Amplitude: 12.875 mV
Lead Channel Sensing Intrinsic Amplitude: 2 mV
Lead Channel Sensing Intrinsic Amplitude: 2 mV
Lead Channel Setting Pacing Amplitude: 0.5 V
Lead Channel Setting Pacing Amplitude: 1.5 V
Lead Channel Setting Pacing Amplitude: 4 V
Lead Channel Setting Pacing Pulse Width: 0.03 ms
Lead Channel Setting Pacing Pulse Width: 1 ms
Lead Channel Setting Sensing Sensitivity: 0.3 mV

## 2021-12-19 NOTE — Progress Notes (Signed)
Remote ICD transmission.   

## 2021-12-26 ENCOUNTER — Other Ambulatory Visit: Payer: Self-pay | Admitting: Internal Medicine

## 2021-12-26 DIAGNOSIS — Z1231 Encounter for screening mammogram for malignant neoplasm of breast: Secondary | ICD-10-CM

## 2022-02-18 ENCOUNTER — Ambulatory Visit
Admission: RE | Admit: 2022-02-18 | Discharge: 2022-02-18 | Disposition: A | Discharge status: Home / Self Care | Payer: Medicare Other | Source: Ambulatory Visit | Attending: Internal Medicine | Admitting: Internal Medicine

## 2022-02-18 DIAGNOSIS — Z1231 Encounter for screening mammogram for malignant neoplasm of breast: Secondary | ICD-10-CM

## 2022-02-18 DIAGNOSIS — E2839 Other primary ovarian failure: Secondary | ICD-10-CM

## 2022-03-10 ENCOUNTER — Other Ambulatory Visit: Payer: Self-pay | Admitting: *Deleted

## 2022-03-10 DIAGNOSIS — I739 Peripheral vascular disease, unspecified: Secondary | ICD-10-CM

## 2022-03-13 ENCOUNTER — Ambulatory Visit (INDEPENDENT_AMBULATORY_CARE_PROVIDER_SITE_OTHER): Payer: Medicare Other

## 2022-03-13 DIAGNOSIS — I428 Other cardiomyopathies: Secondary | ICD-10-CM

## 2022-03-14 LAB — CUP PACEART REMOTE DEVICE CHECK
Battery Remaining Longevity: 29 mo
Battery Voltage: 2.96 V
Brady Statistic AP VP Percent: 0.4 %
Brady Statistic AP VS Percent: 0.03 %
Brady Statistic AS VP Percent: 99.54 %
Brady Statistic AS VS Percent: 0.03 %
Brady Statistic RA Percent Paced: 0.42 %
Brady Statistic RV Percent Paced: 99.88 %
Date Time Interrogation Session: 20230616074226
HighPow Impedance: 73 Ohm
Implantable Lead Implant Date: 20161013
Implantable Lead Implant Date: 20161013
Implantable Lead Implant Date: 20161013
Implantable Lead Location: 753858
Implantable Lead Location: 753859
Implantable Lead Location: 753860
Implantable Lead Model: 4398
Implantable Lead Model: 5076
Implantable Pulse Generator Implant Date: 20210618
Lead Channel Impedance Value: 171 Ohm
Lead Channel Impedance Value: 171 Ohm
Lead Channel Impedance Value: 171 Ohm
Lead Channel Impedance Value: 180 Ohm
Lead Channel Impedance Value: 180 Ohm
Lead Channel Impedance Value: 342 Ohm
Lead Channel Impedance Value: 342 Ohm
Lead Channel Impedance Value: 342 Ohm
Lead Channel Impedance Value: 380 Ohm
Lead Channel Impedance Value: 399 Ohm
Lead Channel Impedance Value: 456 Ohm
Lead Channel Impedance Value: 532 Ohm
Lead Channel Impedance Value: 570 Ohm
Lead Channel Impedance Value: 627 Ohm
Lead Channel Impedance Value: 627 Ohm
Lead Channel Impedance Value: 627 Ohm
Lead Channel Impedance Value: 646 Ohm
Lead Channel Impedance Value: 646 Ohm
Lead Channel Pacing Threshold Amplitude: 0.75 V
Lead Channel Pacing Threshold Amplitude: 3.75 V
Lead Channel Pacing Threshold Pulse Width: 0.4 ms
Lead Channel Pacing Threshold Pulse Width: 1 ms
Lead Channel Sensing Intrinsic Amplitude: 11.75 mV
Lead Channel Sensing Intrinsic Amplitude: 11.75 mV
Lead Channel Sensing Intrinsic Amplitude: 2.25 mV
Lead Channel Sensing Intrinsic Amplitude: 2.25 mV
Lead Channel Setting Pacing Amplitude: 0.5 V
Lead Channel Setting Pacing Amplitude: 1.5 V
Lead Channel Setting Pacing Amplitude: 4 V
Lead Channel Setting Pacing Pulse Width: 0.03 ms
Lead Channel Setting Pacing Pulse Width: 1 ms
Lead Channel Setting Sensing Sensitivity: 0.3 mV

## 2022-03-19 ENCOUNTER — Encounter: Payer: Self-pay | Admitting: Vascular Surgery

## 2022-03-19 ENCOUNTER — Ambulatory Visit (HOSPITAL_COMMUNITY)
Admission: RE | Admit: 2022-03-19 | Discharge: 2022-03-19 | Disposition: A | Discharge status: Home / Self Care | Payer: Medicare Other | Source: Ambulatory Visit | Attending: Vascular Surgery | Admitting: Vascular Surgery

## 2022-03-19 ENCOUNTER — Ambulatory Visit: Payer: Medicare Other | Admitting: Vascular Surgery

## 2022-03-19 VITALS — BP 168/70 | HR 73 | Temp 97.7°F | Resp 20 | Ht 67.0 in | Wt 206.0 lb

## 2022-03-19 DIAGNOSIS — I739 Peripheral vascular disease, unspecified: Secondary | ICD-10-CM | POA: Diagnosis present

## 2022-03-19 NOTE — Progress Notes (Signed)
REASON FOR VISIT:   Peripheral arterial disease  MEDICAL ISSUES:   PERIPHERAL ARTERIAL DISEASE: This patient has evidence of infrainguinal arterial occlusive disease bilaterally.  I encouraged her to stay as active as possible.  She is really not having significant symptoms so I would not recommend an aggressive work-up unless she developed disabling claudication, rest pain, or nonhealing ulcer.  I have offered to see her as needed but she would like to be seen on a yearly basis which I think is perfectly reasonable.  I have encouraged her to stay as active as possible.  We again discussed the importance of tobacco cessation.  We will see her back in 1 year with ABIs which I have ordered.  She knows to call sooner if she has problems.   HPI:   Nicole Chan is a pleasant 81 y.o. female who I been following with peripheral arterial disease.  I last saw her on 10/23/2020.  She had evidence of infrainguinal arterial occlusive disease bilaterally.  She really was not having significant symptoms at that time except for some arthritis in her left knee.  We again discussed the importance of tobacco cessation.  I encouraged her to stay as active as possible.  I recommended a follow-up visit in 1 year and she comes back for that visit.  Since I saw her last, she does continue to smoke about half a pack per day.  She says that is what keeps her same.  She really denies any significant claudication.  She denies rest pain although she occasionally gets some paresthesias in her left foot at night.  She denies any history of nonhealing wounds.  She had no chest pain or shortness of breath.  Past Medical History:  Diagnosis Date   Arthritis    "maybe in my hands/fingers; not terrible" (07/12/2015)   Chronic systolic CHF (congestive heart failure) (HCC)    Dilated cardiomyopathy (HCC)    Episode of dizziness, secondary to cardiomyopathy possibly  10/25/2014   Essential hypertension    Hypertensive  heart disease    LBBB (left bundle branch block)    a. noted on 10/24/14 admission for dizziness    NICM (nonischemic cardiomyopathy) (HCC)    a. Cath 10/29/14: normal coronary arteries (tortuous, compatible with hypertension). b. s/p Medtronic BiV-ICD 06/2015.   Tobacco abuse     Family History  Problem Relation Age of Onset   Arrhythmia Mother    Hypertension Mother    Diabetes Mother    Heart disease Mother    Cancer Father    Lung cancer Sister    Healthy Paternal Aunt    Heart attack Neg Hx    Stroke Neg Hx     SOCIAL HISTORY: Social History   Tobacco Use   Smoking status: Every Day    Packs/day: 0.50    Years: 55.00    Total pack years: 27.50    Types: Cigarettes   Smokeless tobacco: Never  Substance Use Topics   Alcohol use: No    Alcohol/week: 0.0 standard drinks of alcohol    Comment: on special occasions    Allergies  Allergen Reactions   Shellfish Allergy Itching    Current Outpatient Medications  Medication Sig Dispense Refill   atorvastatin (LIPITOR) 20 MG tablet Take 20 mg by mouth daily.     carvedilol (COREG) 12.5 MG tablet Take 1 tablet (12.5 mg total) by mouth 2 (two) times daily with a meal. Please make overdue appt with Dr. Ladona Ridgel  before anymore refills. Thank you 2nd attempt 30 tablet 0   irbesartan (AVAPRO) 300 MG tablet Take 1 tablet (300 mg total) by mouth daily. 30 tablet 0   Vitamin D, Ergocalciferol, (DRISDOL) 50000 UNITS CAPS capsule Take 50,000 Units by mouth 2 (two) times a week.      No current facility-administered medications for this visit.    REVIEW OF SYSTEMS:  [X]  denotes positive finding, [ ]  denotes negative finding Cardiac  Comments:  Chest pain or chest pressure:    Shortness of breath upon exertion:    Short of breath when lying flat:    Irregular heart rhythm:        Vascular    Pain in calf, thigh, or hip brought on by ambulation:    Pain in feet at night that wakes you up from your sleep:     Blood clot in your  veins:    Leg swelling:         Pulmonary    Oxygen at home:    Productive cough:     Wheezing:         Neurologic    Sudden weakness in arms or legs:     Sudden numbness in arms or legs:     Sudden onset of difficulty speaking or slurred speech:    Temporary loss of vision in one eye:     Problems with dizziness:         Gastrointestinal    Blood in stool:     Vomited blood:         Genitourinary    Burning when urinating:     Blood in urine:        Psychiatric    Major depression:         Hematologic    Bleeding problems:    Problems with blood clotting too easily:        Skin    Rashes or ulcers:        Constitutional    Fever or chills:     PHYSICAL EXAM:   Vitals:   03/19/22 1334  BP: (!) 168/70  Pulse: 73  Resp: 20  Temp: 97.7 F (36.5 C)  SpO2: 97%  Weight: 206 lb (93.4 kg)  Height: 5\' 7"  (1.702 m)    GENERAL: The patient is a well-nourished female, in no acute distress. The vital signs are documented above. CARDIAC: There is a regular rate and rhythm.  VASCULAR: I do not detect carotid bruits. She has palpable femoral pulses bilaterally. I cannot palpate pedal pulses.  Both feet are warm and well-perfused. She has no significant lower extremity swelling. PULMONARY: There is good air exchange bilaterally without wheezing or rales. ABDOMEN: Soft and non-tender with normal pitched bowel sounds.  MUSCULOSKELETAL: There are no major deformities or cyanosis. NEUROLOGIC: No focal weakness or paresthesias are detected. SKIN: There are no ulcers or rashes noted. PSYCHIATRIC: The patient has a normal affect.  DATA:    ARTERIAL DOPPLER STUDY: I have independently interpreted her arterial Doppler study today.  On the right side there is a monophasic dorsalis pedis and posterior tibial signal.  ABIs 59%.  Toe pressure 65 mmHg.  On the left side is a monophasic dorsalis pedis and posterior tibial signal.  ABIs 42%.  Toe pressures 55 mmHg.  [facility] [phone]

## 2022-03-19 NOTE — Progress Notes (Signed)
Remote ICD transmission.   

## 2022-06-12 ENCOUNTER — Ambulatory Visit (INDEPENDENT_AMBULATORY_CARE_PROVIDER_SITE_OTHER): Payer: Medicare Other

## 2022-06-12 DIAGNOSIS — I428 Other cardiomyopathies: Secondary | ICD-10-CM | POA: Diagnosis not present

## 2022-06-13 LAB — CUP PACEART REMOTE DEVICE CHECK
Battery Remaining Longevity: 27 mo
Battery Voltage: 2.95 V
Brady Statistic AP VP Percent: 0.36 %
Brady Statistic AP VS Percent: 0.03 %
Brady Statistic AS VP Percent: 99.57 %
Brady Statistic AS VS Percent: 0.04 %
Brady Statistic RA Percent Paced: 0.39 %
Brady Statistic RV Percent Paced: 99.89 %
Date Time Interrogation Session: 20230915012303
HighPow Impedance: 79 Ohm
Implantable Lead Implant Date: 20161013
Implantable Lead Implant Date: 20161013
Implantable Lead Implant Date: 20161013
Implantable Lead Location: 753858
Implantable Lead Location: 753859
Implantable Lead Location: 753860
Implantable Lead Model: 4398
Implantable Lead Model: 5076
Implantable Pulse Generator Implant Date: 20210618
Lead Channel Impedance Value: 166.114
Lead Channel Impedance Value: 174.595
Lead Channel Impedance Value: 178.5 Ohm
Lead Channel Impedance Value: 180 Ohm
Lead Channel Impedance Value: 194.634
Lead Channel Impedance Value: 323 Ohm
Lead Channel Impedance Value: 342 Ohm
Lead Channel Impedance Value: 342 Ohm
Lead Channel Impedance Value: 380 Ohm
Lead Channel Impedance Value: 399 Ohm
Lead Channel Impedance Value: 437 Ohm
Lead Channel Impedance Value: 532 Ohm
Lead Channel Impedance Value: 570 Ohm
Lead Channel Impedance Value: 589 Ohm
Lead Channel Impedance Value: 589 Ohm
Lead Channel Impedance Value: 627 Ohm
Lead Channel Impedance Value: 627 Ohm
Lead Channel Impedance Value: 646 Ohm
Lead Channel Pacing Threshold Amplitude: 0.625 V
Lead Channel Pacing Threshold Amplitude: 4.25 V
Lead Channel Pacing Threshold Pulse Width: 0.4 ms
Lead Channel Pacing Threshold Pulse Width: 1 ms
Lead Channel Sensing Intrinsic Amplitude: 11 mV
Lead Channel Sensing Intrinsic Amplitude: 11 mV
Lead Channel Sensing Intrinsic Amplitude: 2 mV
Lead Channel Sensing Intrinsic Amplitude: 2 mV
Lead Channel Setting Pacing Amplitude: 0.5 V
Lead Channel Setting Pacing Amplitude: 1.5 V
Lead Channel Setting Pacing Amplitude: 4 V
Lead Channel Setting Pacing Pulse Width: 0.03 ms
Lead Channel Setting Pacing Pulse Width: 1 ms
Lead Channel Setting Sensing Sensitivity: 0.3 mV

## 2022-06-23 NOTE — Progress Notes (Signed)
Remote ICD transmission.   

## 2022-09-11 ENCOUNTER — Ambulatory Visit (INDEPENDENT_AMBULATORY_CARE_PROVIDER_SITE_OTHER): Payer: Medicare Other

## 2022-09-11 DIAGNOSIS — I428 Other cardiomyopathies: Secondary | ICD-10-CM

## 2022-09-11 LAB — CUP PACEART REMOTE DEVICE CHECK
Battery Remaining Longevity: 23 mo
Battery Voltage: 2.94 V
Brady Statistic AP VP Percent: 0.41 %
Brady Statistic AP VS Percent: 0.03 %
Brady Statistic AS VP Percent: 99.53 %
Brady Statistic AS VS Percent: 0.04 %
Brady Statistic RA Percent Paced: 0.43 %
Brady Statistic RV Percent Paced: 99.77 %
Date Time Interrogation Session: 20231215043726
HighPow Impedance: 67 Ohm
Implantable Lead Connection Status: 753985
Implantable Lead Connection Status: 753985
Implantable Lead Connection Status: 753985
Implantable Lead Implant Date: 20161013
Implantable Lead Implant Date: 20161013
Implantable Lead Implant Date: 20161013
Implantable Lead Location: 753858
Implantable Lead Location: 753859
Implantable Lead Location: 753860
Implantable Lead Model: 4398
Implantable Lead Model: 5076
Implantable Pulse Generator Implant Date: 20210618
Lead Channel Impedance Value: 180 Ohm
Lead Channel Impedance Value: 180 Ohm
Lead Channel Impedance Value: 184.154
Lead Channel Impedance Value: 190 Ohm
Lead Channel Impedance Value: 194.634
Lead Channel Impedance Value: 342 Ohm
Lead Channel Impedance Value: 380 Ohm
Lead Channel Impedance Value: 380 Ohm
Lead Channel Impedance Value: 380 Ohm
Lead Channel Impedance Value: 399 Ohm
Lead Channel Impedance Value: 437 Ohm
Lead Channel Impedance Value: 494 Ohm
Lead Channel Impedance Value: 532 Ohm
Lead Channel Impedance Value: 589 Ohm
Lead Channel Impedance Value: 646 Ohm
Lead Channel Impedance Value: 646 Ohm
Lead Channel Impedance Value: 665 Ohm
Lead Channel Impedance Value: 665 Ohm
Lead Channel Pacing Threshold Amplitude: 0.625 V
Lead Channel Pacing Threshold Amplitude: 4.25 V
Lead Channel Pacing Threshold Pulse Width: 0.4 ms
Lead Channel Pacing Threshold Pulse Width: 1 ms
Lead Channel Sensing Intrinsic Amplitude: 13.5 mV
Lead Channel Sensing Intrinsic Amplitude: 13.5 mV
Lead Channel Sensing Intrinsic Amplitude: 2.25 mV
Lead Channel Sensing Intrinsic Amplitude: 2.25 mV
Lead Channel Setting Pacing Amplitude: 0.5 V
Lead Channel Setting Pacing Amplitude: 1.5 V
Lead Channel Setting Pacing Amplitude: 4 V
Lead Channel Setting Pacing Pulse Width: 0.03 ms
Lead Channel Setting Pacing Pulse Width: 1 ms
Lead Channel Setting Sensing Sensitivity: 0.3 mV
Zone Setting Status: 755011
Zone Setting Status: 755011

## 2022-10-01 NOTE — Progress Notes (Signed)
Remote ICD transmission.   

## 2022-12-11 ENCOUNTER — Ambulatory Visit: Payer: Medicare Other

## 2022-12-11 DIAGNOSIS — I428 Other cardiomyopathies: Secondary | ICD-10-CM | POA: Diagnosis not present

## 2022-12-13 LAB — CUP PACEART REMOTE DEVICE CHECK
Battery Remaining Longevity: 22 mo
Battery Voltage: 2.94 V
Brady Statistic AP VP Percent: 1.31 %
Brady Statistic AP VS Percent: 0.02 %
Brady Statistic AS VP Percent: 98.63 %
Brady Statistic AS VS Percent: 0.04 %
Brady Statistic RA Percent Paced: 1.33 %
Brady Statistic RV Percent Paced: 99.91 %
Date Time Interrogation Session: 20240315073726
HighPow Impedance: 77 Ohm
Implantable Lead Connection Status: 753985
Implantable Lead Connection Status: 753985
Implantable Lead Connection Status: 753985
Implantable Lead Implant Date: 20161013
Implantable Lead Implant Date: 20161013
Implantable Lead Implant Date: 20161013
Implantable Lead Location: 753858
Implantable Lead Location: 753859
Implantable Lead Location: 753860
Implantable Lead Model: 4398
Implantable Lead Model: 5076
Implantable Pulse Generator Implant Date: 20210618
Lead Channel Impedance Value: 168.889
Lead Channel Impedance Value: 172.541
Lead Channel Impedance Value: 179.282
Lead Channel Impedance Value: 203.256
Lead Channel Impedance Value: 208.568
Lead Channel Impedance Value: 304 Ohm
Lead Channel Impedance Value: 380 Ohm
Lead Channel Impedance Value: 380 Ohm
Lead Channel Impedance Value: 399 Ohm
Lead Channel Impedance Value: 399 Ohm
Lead Channel Impedance Value: 437 Ohm
Lead Channel Impedance Value: 513 Ohm
Lead Channel Impedance Value: 532 Ohm
Lead Channel Impedance Value: 570 Ohm
Lead Channel Impedance Value: 589 Ohm
Lead Channel Impedance Value: 627 Ohm
Lead Channel Impedance Value: 665 Ohm
Lead Channel Impedance Value: 703 Ohm
Lead Channel Pacing Threshold Amplitude: 0.625 V
Lead Channel Pacing Threshold Amplitude: 4 V
Lead Channel Pacing Threshold Pulse Width: 0.4 ms
Lead Channel Pacing Threshold Pulse Width: 1 ms
Lead Channel Sensing Intrinsic Amplitude: 2.375 mV
Lead Channel Sensing Intrinsic Amplitude: 2.375 mV
Lead Channel Sensing Intrinsic Amplitude: 9.875 mV
Lead Channel Sensing Intrinsic Amplitude: 9.875 mV
Lead Channel Setting Pacing Amplitude: 0.5 V
Lead Channel Setting Pacing Amplitude: 1.5 V
Lead Channel Setting Pacing Amplitude: 4 V
Lead Channel Setting Pacing Pulse Width: 0.03 ms
Lead Channel Setting Pacing Pulse Width: 1 ms
Lead Channel Setting Sensing Sensitivity: 0.3 mV
Zone Setting Status: 755011
Zone Setting Status: 755011

## 2022-12-14 ENCOUNTER — Telehealth: Payer: Self-pay

## 2022-12-14 NOTE — Telephone Encounter (Signed)
Scheduled remote reviewed. Normal device function.   LV capture threshold is right at the amplitude setting of 4 volts at 1 ms, trending shows this to not be a new finding.  RV amplitude is subthresholds, sent to triage. Next remote 91 days. Kathy Breach, RN, CCDS, [employer]  Patient is past due for in office check with Dr. Lovena Le.  She hasn't been seen since 2021.  Forwarding to scheduler to get her in for routine in office care appt.

## 2022-12-23 ENCOUNTER — Encounter: Payer: Self-pay | Admitting: Internal Medicine

## 2023-01-11 NOTE — Progress Notes (Signed)
Remote ICD transmission.   

## 2023-01-25 ENCOUNTER — Ambulatory Visit (INDEPENDENT_AMBULATORY_CARE_PROVIDER_SITE_OTHER): Payer: Medicare Other

## 2023-01-25 DIAGNOSIS — I428 Other cardiomyopathies: Secondary | ICD-10-CM | POA: Diagnosis not present

## 2023-01-26 LAB — CUP PACEART REMOTE DEVICE CHECK
Battery Remaining Longevity: 20 mo
Battery Voltage: 2.93 V
Brady Statistic AP VP Percent: 2.14 %
Brady Statistic AP VS Percent: 0.02 %
Brady Statistic AS VP Percent: 97.81 %
Brady Statistic AS VS Percent: 0.04 %
Brady Statistic RA Percent Paced: 2.15 %
Brady Statistic RV Percent Paced: 99.55 %
Date Time Interrogation Session: 20240429022824
HighPow Impedance: 66 Ohm
Implantable Lead Connection Status: 753985
Implantable Lead Connection Status: 753985
Implantable Lead Connection Status: 753985
Implantable Lead Implant Date: 20161013
Implantable Lead Implant Date: 20161013
Implantable Lead Implant Date: 20161013
Implantable Lead Location: 753858
Implantable Lead Location: 753859
Implantable Lead Location: 753860
Implantable Lead Model: 4398
Implantable Lead Model: 5076
Implantable Pulse Generator Implant Date: 20210618
Lead Channel Impedance Value: 160.941
Lead Channel Impedance Value: 172.541
Lead Channel Impedance Value: 172.541
Lead Channel Impedance Value: 184.154
Lead Channel Impedance Value: 199.5 Ohm
Lead Channel Impedance Value: 304 Ohm
Lead Channel Impedance Value: 342 Ohm
Lead Channel Impedance Value: 399 Ohm
Lead Channel Impedance Value: 399 Ohm
Lead Channel Impedance Value: 399 Ohm
Lead Channel Impedance Value: 456 Ohm
Lead Channel Impedance Value: 532 Ohm
Lead Channel Impedance Value: 570 Ohm
Lead Channel Impedance Value: 570 Ohm
Lead Channel Impedance Value: 589 Ohm
Lead Channel Impedance Value: 627 Ohm
Lead Channel Impedance Value: 646 Ohm
Lead Channel Impedance Value: 703 Ohm
Lead Channel Pacing Threshold Amplitude: 0.625 V
Lead Channel Pacing Threshold Amplitude: 3.5 V
Lead Channel Pacing Threshold Pulse Width: 0.4 ms
Lead Channel Pacing Threshold Pulse Width: 1 ms
Lead Channel Sensing Intrinsic Amplitude: 1.875 mV
Lead Channel Sensing Intrinsic Amplitude: 1.875 mV
Lead Channel Sensing Intrinsic Amplitude: 14.125 mV
Lead Channel Sensing Intrinsic Amplitude: 14.125 mV
Lead Channel Setting Pacing Amplitude: 0.5 V
Lead Channel Setting Pacing Amplitude: 1.5 V
Lead Channel Setting Pacing Amplitude: 4 V
Lead Channel Setting Pacing Pulse Width: 0.03 ms
Lead Channel Setting Pacing Pulse Width: 1 ms
Lead Channel Setting Sensing Sensitivity: 0.3 mV
Zone Setting Status: 755011
Zone Setting Status: 755011

## 2023-02-25 NOTE — Progress Notes (Signed)
Remote ICD transmission.   

## 2023-03-12 ENCOUNTER — Ambulatory Visit (INDEPENDENT_AMBULATORY_CARE_PROVIDER_SITE_OTHER): Payer: Medicare Other

## 2023-03-12 DIAGNOSIS — I428 Other cardiomyopathies: Secondary | ICD-10-CM

## 2023-03-12 LAB — CUP PACEART REMOTE DEVICE CHECK
Battery Remaining Longevity: 18 mo
Battery Voltage: 2.92 V
Brady Statistic AP VP Percent: 1.47 %
Brady Statistic AP VS Percent: 0.02 %
Brady Statistic AS VP Percent: 98.47 %
Brady Statistic AS VS Percent: 0.04 %
Brady Statistic RA Percent Paced: 1.48 %
Brady Statistic RV Percent Paced: 99.52 %
Date Time Interrogation Session: 20240614063427
HighPow Impedance: 76 Ohm
Implantable Lead Connection Status: 753985
Implantable Lead Connection Status: 753985
Implantable Lead Connection Status: 753985
Implantable Lead Implant Date: 20161013
Implantable Lead Implant Date: 20161013
Implantable Lead Implant Date: 20161013
Implantable Lead Location: 753858
Implantable Lead Location: 753859
Implantable Lead Location: 753860
Implantable Lead Model: 4398
Implantable Lead Model: 5076
Implantable Pulse Generator Implant Date: 20210618
Lead Channel Impedance Value: 166.114
Lead Channel Impedance Value: 174.595
Lead Channel Impedance Value: 174.595
Lead Channel Impedance Value: 180 Ohm
Lead Channel Impedance Value: 190 Ohm
Lead Channel Impedance Value: 323 Ohm
Lead Channel Impedance Value: 342 Ohm
Lead Channel Impedance Value: 380 Ohm
Lead Channel Impedance Value: 380 Ohm
Lead Channel Impedance Value: 380 Ohm
Lead Channel Impedance Value: 456 Ohm
Lead Channel Impedance Value: 513 Ohm
Lead Channel Impedance Value: 532 Ohm
Lead Channel Impedance Value: 570 Ohm
Lead Channel Impedance Value: 589 Ohm
Lead Channel Impedance Value: 589 Ohm
Lead Channel Impedance Value: 646 Ohm
Lead Channel Impedance Value: 646 Ohm
Lead Channel Pacing Threshold Amplitude: 0.625 V
Lead Channel Pacing Threshold Amplitude: 3.75 V
Lead Channel Pacing Threshold Pulse Width: 0.4 ms
Lead Channel Pacing Threshold Pulse Width: 1 ms
Lead Channel Sensing Intrinsic Amplitude: 14.125 mV
Lead Channel Sensing Intrinsic Amplitude: 14.125 mV
Lead Channel Sensing Intrinsic Amplitude: 2.25 mV
Lead Channel Sensing Intrinsic Amplitude: 2.25 mV
Lead Channel Setting Pacing Amplitude: 0.5 V
Lead Channel Setting Pacing Amplitude: 1.5 V
Lead Channel Setting Pacing Amplitude: 4 V
Lead Channel Setting Pacing Pulse Width: 0.03 ms
Lead Channel Setting Pacing Pulse Width: 1 ms
Lead Channel Setting Sensing Sensitivity: 0.3 mV
Zone Setting Status: 755011
Zone Setting Status: 755011

## 2023-03-16 ENCOUNTER — Other Ambulatory Visit: Payer: Self-pay | Admitting: *Deleted

## 2023-03-16 MED ORDER — CARVEDILOL 12.5 MG PO TABS: 12.5000 mg | ORAL_TABLET | Freq: Two times a day (BID) | ORAL | 0 refills | Status: DC

## 2023-03-25 ENCOUNTER — Ambulatory Visit: Payer: Medicare Other | Attending: Internal Medicine | Admitting: Internal Medicine

## 2023-03-25 ENCOUNTER — Encounter: Payer: Self-pay | Admitting: Internal Medicine

## 2023-03-25 VITALS — BP 144/92 | HR 77 | Ht 67.0 in | Wt 200.6 lb

## 2023-03-25 DIAGNOSIS — I428 Other cardiomyopathies: Secondary | ICD-10-CM | POA: Diagnosis not present

## 2023-03-25 NOTE — Patient Instructions (Signed)
Medication Instructions:  Your physician recommends that you continue on your current medications as directed. Please refer to the Current Medication list given to you today.  *If you need a refill on your cardiac medications before your next appointment, please call your pharmacy*   Lab Work: None ordered   Testing/Procedures: None ordered   Follow-Up: At [facility], you and your health needs are our priority.  As part of our continuing mission to provide you with exceptional heart care, we have created designated Provider Care Teams.  These Care Teams include your primary Cardiologist (physician) and Advanced Practice Providers (APPs -  Physician Assistants and Nurse Practitioners) who all work together to provide you with the care you need, when you need it.  Remote monitoring is used to monitor your Pacemaker or ICD from home. This monitoring reduces the number of office visits required to check your device to one time per year. It allows [place] to keep an eye on the functioning of your device to ensure it is working properly. You are scheduled for a device check from home on 06/11/2023. You may send your transmission at any time that day. If you have a wireless device, the transmission will be sent automatically. After your physician reviews your transmission, you will receive a postcard with your next transmission date.  Your next appointment:   1 year(s)  The format for your next appointment:   In Person  Provider:   Lewayne Bunting, MD    Thank you for choosing [facility]!!   [phone]

## 2023-03-25 NOTE — Progress Notes (Signed)
HPI Nicole Chan returns today for followup. She is a pleasant 82 yo woman with a non-ischemic CM, LBBB, s/p biv ICD insertion with ICD gen change out 3 years ago. She denies chest pain or sob. She is pending a trip to [place]. She has retired.  Allergies  Allergen Reactions   Shellfish Allergy Itching     Current Outpatient Medications  Medication Sig Dispense Refill   atorvastatin (LIPITOR) 20 MG tablet Take 20 mg by mouth daily.     carvedilol (COREG) 12.5 MG tablet Take 1 tablet (12.5 mg total) by mouth 2 (two) times daily with a meal. Please make overdue appt with Dr. Ladona Ridgel before anymore refills. Thank you 3rd attempt. Keep appt with Dr. Ladona Ridgel on June 27 for any future refills. 30 tablet 0   irbesartan (AVAPRO) 300 MG tablet Take 1 tablet (300 mg total) by mouth daily. 30 tablet 0   Vitamin D, Ergocalciferol, (DRISDOL) 50000 UNITS CAPS capsule Take 50,000 Units by mouth 2 (two) times a week.      No current facility-administered medications for this visit.     Past Medical History:  Diagnosis Date   Arthritis    "maybe in my hands/fingers; not terrible" (07/12/2015)   Chronic systolic CHF (congestive heart failure) (HCC)    Dilated cardiomyopathy (HCC)    Episode of dizziness, secondary to cardiomyopathy possibly  10/25/2014   Essential hypertension    Hypertensive heart disease    LBBB (left bundle branch block)    a. noted on 10/24/14 admission for dizziness    NICM (nonischemic cardiomyopathy) (HCC)    a. Cath 10/29/14: normal coronary arteries (tortuous, compatible with hypertension). b. s/p Medtronic BiV-ICD 06/2015.   Tobacco abuse     ROS:   All systems reviewed and negative except as noted in the HPI.   Past Surgical History:  Procedure Laterality Date   ABDOMINAL HYSTERECTOMY  1970's   APPENDECTOMY     BI-VENTRICULAR IMPLANTABLE CARDIOVERTER DEFIBRILLATOR  (CRT-D)  07/11/2015   BIV ICD GENERATOR CHANGEOUT N/A 03/15/2020   Procedure: BIV ICD GENERATOR  CHANGEOUT;  Surgeon: Marinus Maw, MD;  Location: [facility] INVASIVE CV LAB;  Service: Cardiovascular;  Laterality: N/A;   CARDIAC CATHETERIZATION     EP IMPLANTABLE DEVICE N/A 07/11/2015   Procedure: BiV ICD Insertion CRT-D;  Surgeon: Marinus Maw, MD;  Location: [facility] INVASIVE CV LAB;  Service: Cardiovascular;  Laterality: N/A;   LEFT HEART CATHETERIZATION WITH CORONARY ANGIOGRAM N/A 10/29/2014   Procedure: LEFT HEART CATHETERIZATION WITH CORONARY ANGIOGRAM;  Surgeon: Lesleigh Noe, MD;  Location: [facility] CATH LAB;  Service: Cardiovascular;  Laterality: N/A;   TONSILLECTOMY     TUBAL LIGATION  1970's     Family History  Problem Relation Age of Onset   Arrhythmia Mother    Hypertension Mother    Diabetes Mother    Heart disease Mother    Cancer Father    Lung cancer Sister    Healthy Paternal Aunt    Heart attack Neg Hx    Stroke Neg Hx      Social History   Socioeconomic History   Marital status: Widowed    Spouse name: Not on file   Number of children: 4   Years of education: Not on file   Highest education level: Not on file  Occupational History   Not on file  Tobacco Use   Smoking status: Every Day    Packs/day: 0.50    Years: 55.00  Additional pack years: 0.00    Total pack years: 27.50    Types: Cigarettes   Smokeless tobacco: Never  Substance and Sexual Activity   Alcohol use: No    Alcohol/week: 0.0 standard drinks of alcohol    Comment: on special occasions   Drug use: No   Sexual activity: Yes  Other Topics Concern   Not on file  Social History Narrative   Not on file   Social Determinants of Health   Financial Resource Strain: Not on file  Food Insecurity: Not on file  Transportation Needs: Not on file  Physical Activity: Not on file  Stress: Not on file  Social Connections: Not on file  Intimate Partner Violence: Not on file     BP (!) 144/92   Pulse 77   Ht 5\' 7"  (1.702 m)   Wt 200 lb 9.6 oz (91 kg)   SpO2 98%   BMI 31.42 kg/m    Physical Exam:  Well appearing 82 yo woman looking younger than her stated age, NAD HEENT: Unremarkable Neck:  No JVD, no thyromegally Lymphatics:  No adenopathy Back:  No CVA tenderness Lungs:  Clear with no wheezes HEART:  Regular rate rhythm, no murmurs, no rubs, no clicks Abd:  soft, positive bowel sounds, no organomegally, no rebound, no guarding Ext:  2 plus pulses, no edema, no cyanosis, no clubbing Skin:  No rashes no nodules Neuro:  CN II through XII intact, motor grossly intact  EKG - nsr with biv pacing  DEVICE  Normal device function.  See PaceArt for details.   Assess/Plan:  1. Chronic systolic heart failure - her symptoms remain class 2. She will continue her current meds.  2. ICD -her medtronic device is working normally. Her LV threshold remains high and will result in her device depleting after about 5 years. 3. Obesity - her weight is down 11 lbs over the past 3 years. We will follow. She is encouraged to lose weight.    Nicole Gowda Fleetwood Pierron,MD

## 2023-03-30 NOTE — Progress Notes (Signed)
Remote ICD transmission.   

## 2023-06-11 ENCOUNTER — Ambulatory Visit (INDEPENDENT_AMBULATORY_CARE_PROVIDER_SITE_OTHER): Payer: Medicare Other

## 2023-06-11 DIAGNOSIS — I428 Other cardiomyopathies: Secondary | ICD-10-CM

## 2023-06-11 LAB — CUP PACEART REMOTE DEVICE CHECK
Battery Remaining Longevity: 14 mo
Battery Voltage: 2.91 V
Brady Statistic AP VP Percent: 0.97 %
Brady Statistic AP VS Percent: 0.02 %
Brady Statistic AS VP Percent: 98.96 %
Brady Statistic AS VS Percent: 0.04 %
Brady Statistic RA Percent Paced: 1 %
Brady Statistic RV Percent Paced: 99.81 %
Date Time Interrogation Session: 20240913022824
HighPow Impedance: 79 Ohm
Implantable Lead Connection Status: 753985
Implantable Lead Connection Status: 753985
Implantable Lead Connection Status: 753985
Implantable Lead Implant Date: 20161013
Implantable Lead Implant Date: 20161013
Implantable Lead Implant Date: 20161013
Implantable Lead Location: 753858
Implantable Lead Location: 753859
Implantable Lead Location: 753860
Implantable Lead Model: 4398
Implantable Lead Model: 5076
Implantable Pulse Generator Implant Date: 20210618
Lead Channel Impedance Value: 168.889
Lead Channel Impedance Value: 179.282
Lead Channel Impedance Value: 179.282
Lead Channel Impedance Value: 203.256
Lead Channel Impedance Value: 218.5 Ohm
Lead Channel Impedance Value: 304 Ohm
Lead Channel Impedance Value: 380 Ohm
Lead Channel Impedance Value: 399 Ohm
Lead Channel Impedance Value: 399 Ohm
Lead Channel Impedance Value: 437 Ohm
Lead Channel Impedance Value: 437 Ohm
Lead Channel Impedance Value: 494 Ohm
Lead Channel Impedance Value: 570 Ohm
Lead Channel Impedance Value: 589 Ohm
Lead Channel Impedance Value: 589 Ohm
Lead Channel Impedance Value: 589 Ohm
Lead Channel Impedance Value: 665 Ohm
Lead Channel Impedance Value: 722 Ohm
Lead Channel Pacing Threshold Amplitude: 0.625 V
Lead Channel Pacing Threshold Amplitude: 3.75 V
Lead Channel Pacing Threshold Pulse Width: 0.4 ms
Lead Channel Pacing Threshold Pulse Width: 1 ms
Lead Channel Sensing Intrinsic Amplitude: 15 mV
Lead Channel Sensing Intrinsic Amplitude: 15 mV
Lead Channel Sensing Intrinsic Amplitude: 2.125 mV
Lead Channel Sensing Intrinsic Amplitude: 2.125 mV
Lead Channel Setting Pacing Amplitude: 0.5 V
Lead Channel Setting Pacing Amplitude: 1.5 V
Lead Channel Setting Pacing Amplitude: 4 V
Lead Channel Setting Pacing Pulse Width: 0.03 ms
Lead Channel Setting Pacing Pulse Width: 1 ms
Lead Channel Setting Sensing Sensitivity: 0.3 mV
Zone Setting Status: 755011
Zone Setting Status: 755011

## 2023-06-16 NOTE — Progress Notes (Signed)
Remote ICD transmission.   

## 2023-07-16 ENCOUNTER — Other Ambulatory Visit: Payer: Self-pay | Admitting: Internal Medicine

## 2023-07-19 ENCOUNTER — Other Ambulatory Visit: Payer: Self-pay

## 2023-07-19 MED ORDER — CARVEDILOL 12.5 MG PO TABS: 12.5000 mg | ORAL_TABLET | Freq: Two times a day (BID) | ORAL | 2 refills | Status: DC

## 2023-08-25 ENCOUNTER — Emergency Department (HOSPITAL_COMMUNITY): Payer: Medicare Other

## 2023-08-25 ENCOUNTER — Inpatient Hospital Stay (HOSPITAL_COMMUNITY): Payer: Medicare Other

## 2023-08-25 ENCOUNTER — Encounter (HOSPITAL_COMMUNITY): Payer: Self-pay

## 2023-08-25 ENCOUNTER — Inpatient Hospital Stay (HOSPITAL_COMMUNITY)
Admission: EM | Admit: 2023-08-25 | Discharge: 2023-09-29 | DRG: 064 | Disposition: E | Payer: Medicare Other | Attending: Internal Medicine | Admitting: Internal Medicine

## 2023-08-25 DIAGNOSIS — R29810 Facial weakness: Secondary | ICD-10-CM | POA: Diagnosis present

## 2023-08-25 DIAGNOSIS — Z66 Do not resuscitate: Secondary | ICD-10-CM | POA: Diagnosis not present

## 2023-08-25 DIAGNOSIS — I447 Left bundle-branch block, unspecified: Secondary | ICD-10-CM | POA: Diagnosis present

## 2023-08-25 DIAGNOSIS — Z515 Encounter for palliative care: Secondary | ICD-10-CM

## 2023-08-25 DIAGNOSIS — I63231 Cerebral infarction due to unspecified occlusion or stenosis of right carotid arteries: Secondary | ICD-10-CM | POA: Diagnosis not present

## 2023-08-25 DIAGNOSIS — E876 Hypokalemia: Secondary | ICD-10-CM | POA: Diagnosis not present

## 2023-08-25 DIAGNOSIS — D72829 Elevated white blood cell count, unspecified: Secondary | ICD-10-CM

## 2023-08-25 DIAGNOSIS — R1310 Dysphagia, unspecified: Secondary | ICD-10-CM | POA: Diagnosis present

## 2023-08-25 DIAGNOSIS — I63411 Cerebral infarction due to embolism of right middle cerebral artery: Secondary | ICD-10-CM | POA: Diagnosis present

## 2023-08-25 DIAGNOSIS — I6389 Other cerebral infarction: Secondary | ICD-10-CM

## 2023-08-25 DIAGNOSIS — E43 Unspecified severe protein-calorie malnutrition: Secondary | ICD-10-CM | POA: Diagnosis present

## 2023-08-25 DIAGNOSIS — N179 Acute kidney failure, unspecified: Secondary | ICD-10-CM | POA: Diagnosis not present

## 2023-08-25 DIAGNOSIS — J9601 Acute respiratory failure with hypoxia: Secondary | ICD-10-CM | POA: Diagnosis not present

## 2023-08-25 DIAGNOSIS — Z833 Family history of diabetes mellitus: Secondary | ICD-10-CM

## 2023-08-25 DIAGNOSIS — R569 Unspecified convulsions: Secondary | ICD-10-CM | POA: Diagnosis not present

## 2023-08-25 DIAGNOSIS — Z8249 Family history of ischemic heart disease and other diseases of the circulatory system: Secondary | ICD-10-CM

## 2023-08-25 DIAGNOSIS — I420 Dilated cardiomyopathy: Secondary | ICD-10-CM | POA: Diagnosis present

## 2023-08-25 DIAGNOSIS — R4701 Aphasia: Secondary | ICD-10-CM | POA: Diagnosis present

## 2023-08-25 DIAGNOSIS — G9389 Other specified disorders of brain: Secondary | ICD-10-CM

## 2023-08-25 DIAGNOSIS — R471 Dysarthria and anarthria: Secondary | ICD-10-CM | POA: Diagnosis present

## 2023-08-25 DIAGNOSIS — I110 Hypertensive heart disease with heart failure: Secondary | ICD-10-CM | POA: Diagnosis present

## 2023-08-25 DIAGNOSIS — R29707 NIHSS score 7: Secondary | ICD-10-CM | POA: Diagnosis present

## 2023-08-25 DIAGNOSIS — I63012 Cerebral infarction due to thrombosis of left vertebral artery: Secondary | ICD-10-CM | POA: Diagnosis not present

## 2023-08-25 DIAGNOSIS — Z6830 Body mass index (BMI) 30.0-30.9, adult: Secondary | ICD-10-CM | POA: Diagnosis not present

## 2023-08-25 DIAGNOSIS — M19042 Primary osteoarthritis, left hand: Secondary | ICD-10-CM | POA: Diagnosis present

## 2023-08-25 DIAGNOSIS — I708 Atherosclerosis of other arteries: Secondary | ICD-10-CM | POA: Diagnosis present

## 2023-08-25 DIAGNOSIS — F17210 Nicotine dependence, cigarettes, uncomplicated: Secondary | ICD-10-CM | POA: Diagnosis present

## 2023-08-25 DIAGNOSIS — E785 Hyperlipidemia, unspecified: Secondary | ICD-10-CM | POA: Diagnosis present

## 2023-08-25 DIAGNOSIS — I63011 Cerebral infarction due to thrombosis of right vertebral artery: Secondary | ICD-10-CM | POA: Diagnosis not present

## 2023-08-25 DIAGNOSIS — G4700 Insomnia, unspecified: Secondary | ICD-10-CM | POA: Diagnosis present

## 2023-08-25 DIAGNOSIS — R339 Retention of urine, unspecified: Secondary | ICD-10-CM | POA: Diagnosis not present

## 2023-08-25 DIAGNOSIS — N390 Urinary tract infection, site not specified: Secondary | ICD-10-CM | POA: Diagnosis not present

## 2023-08-25 DIAGNOSIS — R0609 Other forms of dyspnea: Secondary | ICD-10-CM | POA: Diagnosis not present

## 2023-08-25 DIAGNOSIS — R4182 Altered mental status, unspecified: Secondary | ICD-10-CM | POA: Diagnosis not present

## 2023-08-25 DIAGNOSIS — Z809 Family history of malignant neoplasm, unspecified: Secondary | ICD-10-CM

## 2023-08-25 DIAGNOSIS — I748 Embolism and thrombosis of other arteries: Secondary | ICD-10-CM | POA: Diagnosis present

## 2023-08-25 DIAGNOSIS — Z801 Family history of malignant neoplasm of trachea, bronchus and lung: Secondary | ICD-10-CM

## 2023-08-25 DIAGNOSIS — I10 Essential (primary) hypertension: Secondary | ICD-10-CM | POA: Diagnosis present

## 2023-08-25 DIAGNOSIS — D320 Benign neoplasm of cerebral meninges: Secondary | ICD-10-CM | POA: Diagnosis present

## 2023-08-25 DIAGNOSIS — Z95810 Presence of automatic (implantable) cardiac defibrillator: Secondary | ICD-10-CM

## 2023-08-25 DIAGNOSIS — I5020 Unspecified systolic (congestive) heart failure: Secondary | ICD-10-CM | POA: Diagnosis not present

## 2023-08-25 DIAGNOSIS — T17908A Unspecified foreign body in respiratory tract, part unspecified causing other injury, initial encounter: Secondary | ICD-10-CM | POA: Diagnosis not present

## 2023-08-25 DIAGNOSIS — Z720 Tobacco use: Secondary | ICD-10-CM | POA: Diagnosis present

## 2023-08-25 DIAGNOSIS — E860 Dehydration: Secondary | ICD-10-CM | POA: Diagnosis present

## 2023-08-25 DIAGNOSIS — G8194 Hemiplegia, unspecified affecting left nondominant side: Secondary | ICD-10-CM | POA: Diagnosis present

## 2023-08-25 DIAGNOSIS — M899 Disorder of bone, unspecified: Secondary | ICD-10-CM | POA: Diagnosis present

## 2023-08-25 DIAGNOSIS — J690 Pneumonitis due to inhalation of food and vomit: Secondary | ICD-10-CM | POA: Diagnosis not present

## 2023-08-25 DIAGNOSIS — I5022 Chronic systolic (congestive) heart failure: Secondary | ICD-10-CM | POA: Diagnosis present

## 2023-08-25 DIAGNOSIS — R319 Hematuria, unspecified: Secondary | ICD-10-CM | POA: Diagnosis not present

## 2023-08-25 DIAGNOSIS — I639 Cerebral infarction, unspecified: Secondary | ICD-10-CM | POA: Diagnosis present

## 2023-08-25 DIAGNOSIS — G9349 Other encephalopathy: Secondary | ICD-10-CM | POA: Diagnosis present

## 2023-08-25 DIAGNOSIS — R54 Age-related physical debility: Secondary | ICD-10-CM | POA: Diagnosis present

## 2023-08-25 DIAGNOSIS — M19041 Primary osteoarthritis, right hand: Secondary | ICD-10-CM | POA: Diagnosis present

## 2023-08-25 DIAGNOSIS — R4189 Other symptoms and signs involving cognitive functions and awareness: Secondary | ICD-10-CM | POA: Diagnosis present

## 2023-08-25 DIAGNOSIS — E559 Vitamin D deficiency, unspecified: Secondary | ICD-10-CM | POA: Diagnosis present

## 2023-08-25 DIAGNOSIS — Z6832 Body mass index (BMI) 32.0-32.9, adult: Secondary | ICD-10-CM | POA: Diagnosis not present

## 2023-08-25 DIAGNOSIS — D329 Benign neoplasm of meninges, unspecified: Secondary | ICD-10-CM | POA: Insufficient documentation

## 2023-08-25 DIAGNOSIS — Z91013 Allergy to seafood: Secondary | ICD-10-CM

## 2023-08-25 DIAGNOSIS — I6523 Occlusion and stenosis of bilateral carotid arteries: Secondary | ICD-10-CM | POA: Diagnosis not present

## 2023-08-25 DIAGNOSIS — Z79899 Other long term (current) drug therapy: Secondary | ICD-10-CM

## 2023-08-25 DIAGNOSIS — I959 Hypotension, unspecified: Secondary | ICD-10-CM | POA: Diagnosis not present

## 2023-08-25 DIAGNOSIS — E669 Obesity, unspecified: Secondary | ICD-10-CM | POA: Diagnosis present

## 2023-08-25 LAB — URINALYSIS, ROUTINE W REFLEX MICROSCOPIC
Bilirubin Urine: NEGATIVE
Glucose, UA: NEGATIVE mg/dL
Ketones, ur: NEGATIVE mg/dL
Leukocytes,Ua: NEGATIVE
Nitrite: NEGATIVE
Protein, ur: 30 mg/dL — AB
Specific Gravity, Urine: 1.026 (ref 1.005–1.030)
pH: 7 (ref 5.0–8.0)

## 2023-08-25 LAB — I-STAT CHEM 8, ED
BUN: 17 mg/dL (ref 8–23)
Calcium, Ion: 1.22 mmol/L (ref 1.15–1.40)
Chloride: 108 mmol/L (ref 98–111)
Creatinine, Ser: 0.9 mg/dL (ref 0.44–1.00)
Glucose, Bld: 170 mg/dL — ABNORMAL HIGH (ref 70–99)
HCT: 46 % (ref 36.0–46.0)
Hemoglobin: 15.6 g/dL — ABNORMAL HIGH (ref 12.0–15.0)
Potassium: 3.7 mmol/L (ref 3.5–5.1)
Sodium: 141 mmol/L (ref 135–145)
TCO2: 22 mmol/L (ref 22–32)

## 2023-08-25 LAB — COMPREHENSIVE METABOLIC PANEL
ALT: 23 U/L (ref 0–44)
AST: 27 U/L (ref 15–41)
Albumin: 3.6 g/dL (ref 3.5–5.0)
Alkaline Phosphatase: 73 U/L (ref 38–126)
Anion gap: 13 (ref 5–15)
BUN: 17 mg/dL (ref 8–23)
CO2: 19 mmol/L — ABNORMAL LOW (ref 22–32)
Calcium: 10.4 mg/dL — ABNORMAL HIGH (ref 8.9–10.3)
Chloride: 106 mmol/L (ref 98–111)
Creatinine, Ser: 0.98 mg/dL (ref 0.44–1.00)
GFR, Estimated: 58 mL/min — ABNORMAL LOW (ref >60)
Glucose, Bld: 169 mg/dL — ABNORMAL HIGH (ref 70–99)
Potassium: 3.7 mmol/L (ref 3.5–5.1)
Sodium: 138 mmol/L (ref 135–145)
Total Bilirubin: 1.5 mg/dL — ABNORMAL HIGH (ref <1.2)
Total Protein: 6.8 g/dL (ref 6.5–8.1)

## 2023-08-25 LAB — DIFFERENTIAL
Abs Immature Granulocytes: 0.05 10*3/uL (ref 0.00–0.07)
Basophils Absolute: 0 10*3/uL (ref 0.0–0.1)
Basophils Relative: 0 %
Eosinophils Absolute: 0.1 10*3/uL (ref 0.0–0.5)
Eosinophils Relative: 0 %
Immature Granulocytes: 0 %
Lymphocytes Relative: 11 %
Lymphs Abs: 1.5 10*3/uL (ref 0.7–4.0)
Monocytes Absolute: 0.8 10*3/uL (ref 0.1–1.0)
Monocytes Relative: 6 %
Neutro Abs: 11.6 10*3/uL — ABNORMAL HIGH (ref 1.7–7.7)
Neutrophils Relative %: 83 %

## 2023-08-25 LAB — CBG MONITORING, ED: Glucose-Capillary: 159 mg/dL — ABNORMAL HIGH (ref 70–99)

## 2023-08-25 LAB — CBC
HCT: 45.3 % (ref 36.0–46.0)
HCT: 42.3 % (ref 36.0–46.0)
Hemoglobin: 14.9 g/dL (ref 12.0–15.0)
Hemoglobin: 14.3 g/dL (ref 12.0–15.0)
MCH: 29.2 pg (ref 26.0–34.0)
MCH: 29.2 pg (ref 26.0–34.0)
MCHC: 32.9 g/dL (ref 30.0–36.0)
MCHC: 33.8 g/dL (ref 30.0–36.0)
MCV: 88.6 fL (ref 80.0–100.0)
MCV: 86.5 fL (ref 80.0–100.0)
Platelets: 226 10*3/uL (ref 150–400)
Platelets: 217 10*3/uL (ref 150–400)
RBC: 5.11 MIL/uL (ref 3.87–5.11)
RBC: 4.89 MIL/uL (ref 3.87–5.11)
RDW: 12.7 % (ref 11.5–15.5)
RDW: 12.8 % (ref 11.5–15.5)
WBC: 13.9 10*3/uL — ABNORMAL HIGH (ref 4.0–10.5)
WBC: 12.5 10*3/uL — ABNORMAL HIGH (ref 4.0–10.5)
nRBC: 0 % (ref 0.0–0.2)
nRBC: 0 % (ref 0.0–0.2)

## 2023-08-25 LAB — PROTIME-INR
INR: 1.1 (ref 0.8–1.2)
Prothrombin Time: 14.3 s (ref 11.4–15.2)

## 2023-08-25 LAB — CREATININE, SERUM
Creatinine, Ser: 0.81 mg/dL (ref 0.44–1.00)
GFR, Estimated: >60 mL/min (ref >60)

## 2023-08-25 LAB — ECHOCARDIOGRAM COMPLETE
Area-P 1/2: 6.27 cm2
Calc EF: 57.3 %
S' Lateral: 2 cm
Single Plane A2C EF: 51.7 %
Single Plane A4C EF: 54.9 %
Weight: 3160.51 [oz_av]

## 2023-08-25 LAB — RAPID URINE DRUG SCREEN, HOSP PERFORMED
Amphetamines: NOT DETECTED
Barbiturates: NOT DETECTED
Benzodiazepines: NOT DETECTED
Cocaine: NOT DETECTED
Opiates: NOT DETECTED
Tetrahydrocannabinol: NOT DETECTED

## 2023-08-25 LAB — CK: Total CK: 144 U/L (ref 38–234)

## 2023-08-25 LAB — GLUCOSE, CAPILLARY: Glucose-Capillary: 168 mg/dL — ABNORMAL HIGH (ref 70–99)

## 2023-08-25 LAB — APTT: aPTT: 29 s (ref 24–36)

## 2023-08-25 LAB — HEMOGLOBIN A1C
Hgb A1c MFr Bld: 5.4 % (ref 4.8–5.6)
Mean Plasma Glucose: 108.28 mg/dL

## 2023-08-25 LAB — ETHANOL: Alcohol, Ethyl (B): <10 mg/dL (ref <10)

## 2023-08-25 MED ORDER — STROKE: EARLY STAGES OF RECOVERY BOOK
Freq: Once | Status: AC
  Filled 2023-08-25: qty 1

## 2023-08-25 MED ORDER — SENNOSIDES-DOCUSATE SODIUM 8.6-50 MG PO TABS: 1.0000 | ORAL_TABLET | Freq: Every evening | ORAL | Status: DC | PRN

## 2023-08-25 MED ORDER — SODIUM CHLORIDE 0.9 % IV SOLN: INTRAVENOUS | Status: DC

## 2023-08-25 MED ORDER — ONDANSETRON HCL 4 MG/2ML IJ SOLN
4.0000 mg | Freq: Four times a day (QID) | INTRAMUSCULAR | Status: DC | PRN
  Administered 2023-08-25: 4 mg via INTRAVENOUS
  Filled 2023-08-25: qty 2

## 2023-08-25 MED ORDER — ASPIRIN 300 MG RE SUPP: 300.0000 mg | Freq: Every day | RECTAL | Status: DC

## 2023-08-25 MED ORDER — ATORVASTATIN CALCIUM 40 MG PO TABS: 40.0000 mg | ORAL_TABLET | Freq: Every day | ORAL | Status: DC

## 2023-08-25 MED ORDER — ORAL CARE MOUTH RINSE: 15.0000 mL | OROMUCOSAL | Status: DC | PRN

## 2023-08-25 MED ORDER — ACETAMINOPHEN 160 MG/5ML PO SOLN
650.0000 mg | ORAL | Status: DC | PRN
  Administered 2023-08-29: 650 mg

## 2023-08-25 MED ORDER — ENOXAPARIN SODIUM 40 MG/0.4ML IJ SOSY
40.0000 mg | PREFILLED_SYRINGE | INTRAMUSCULAR | Status: DC
Start: 2023-08-25 — End: 2023-09-01
  Administered 2023-08-25 – 2023-08-31 (×7): 40 mg via SUBCUTANEOUS
  Filled 2023-08-25 (×7): qty 0.4

## 2023-08-25 MED ORDER — ASPIRIN 325 MG PO TABS
325.0000 mg | ORAL_TABLET | Freq: Every day | ORAL | Status: DC
  Administered 2023-08-25 – 2023-08-26 (×2): 325 mg via ORAL
  Filled 2023-08-25 (×2): qty 1

## 2023-08-25 MED ORDER — ATORVASTATIN CALCIUM 40 MG PO TABS
40.0000 mg | ORAL_TABLET | Freq: Once | ORAL | Status: AC
  Administered 2023-08-25: 40 mg via ORAL
  Filled 2023-08-25: qty 1

## 2023-08-25 MED ORDER — ACETAMINOPHEN 650 MG RE SUPP
650.0000 mg | RECTAL | Status: DC | PRN
  Filled 2023-08-25: qty 1

## 2023-08-25 MED ORDER — IOHEXOL 350 MG/ML SOLN
100.0000 mL | Freq: Once | INTRAVENOUS | Status: AC | PRN
  Administered 2023-08-25: 100 mL via INTRAVENOUS

## 2023-08-25 MED ORDER — ACETAMINOPHEN 325 MG PO TABS
650.0000 mg | ORAL_TABLET | ORAL | Status: DC | PRN
  Filled 2023-08-25: qty 2

## 2023-08-25 MED ORDER — ORAL CARE MOUTH RINSE
15.0000 mL | OROMUCOSAL | Status: DC
  Administered 2023-08-25 – 2023-09-01 (×25): 15 mL via OROMUCOSAL

## 2023-08-25 MED ORDER — CARVEDILOL 12.5 MG PO TABS: 12.5000 mg | ORAL_TABLET | Freq: Two times a day (BID) | ORAL | Status: DC

## 2023-08-25 MED ORDER — CLOPIDOGREL BISULFATE 75 MG PO TABS
75.0000 mg | ORAL_TABLET | Freq: Every day | ORAL | Status: DC
  Administered 2023-08-25 – 2023-08-30 (×6): 75 mg via ORAL
  Filled 2023-08-25 (×7): qty 1

## 2023-08-25 NOTE — ED Triage Notes (Addendum)
Pt bib [facility] from home. A&Ox4, LKW 08/24/2023 at 2200. Pt was found laying on floor saturated in urine.  Pt c/o urinary frequency. Pt appeared to have L side facial droop, left arm/lef drift. EMS noted a right side fixed gazed. Pt states she is not on any blood thinners. 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

## 2023-08-25 NOTE — Assessment & Plan Note (Signed)
This is current, we discussed that tobacco use does count as a major risk factor for stroke first precipitation.  And abstinence would have 100% be advised.  Patient seemed to understand.  We will monitor clinically at this time

## 2023-08-25 NOTE — Assessment & Plan Note (Signed)
Neutrophil predominant, incidentally noted without any fever without any findings on chest x-ray or UA.  Felt to be likely due to stress of being on the floor for prolonged period of time last night.  Will continue with clinical monitoring.

## 2023-08-25 NOTE — Assessment & Plan Note (Addendum)
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 Contact upon the underlying right frontal lobe without underlying parenchymal edema. Outpatient follow up.  2) Indeterminate 10 mm osseous lesion within the left frontal calvarium. Consider a follow-up brain MRI in 3 months to ensure stability. I will hceck spep t/r/o Mutliple myeloma in the setting of hyperclacemia.

## 2023-08-25 NOTE — Assessment & Plan Note (Addendum)
mild. Paitnet is complaining of pain in suprapubic area, will check Xray t/r/o fracture. Will hydrate patient this evening (patinet on 100 cc/hr of NS infusion). Will check calcium, ionzied, phos, vitamin and pth again in AM. Anticipate resolution by then. Patient does take ergocalciferol 50 k twice a week - seems rather high to me. Hold for now.

## 2023-08-25 NOTE — Consult Note (Signed)
NEUROLOGY CONSULT NOTE   Date of service: August 25, 2023 Patient Name: Nicole Chan MRN:  [id number] DOB:  [birth year] Chief Complaint: "Code stroke-left-sided weakness" Requesting Provider: Vanetta Mulders, MD  History of Present Illness  Nicole Chan is a 82 y.o. female  has a past medical history of Arthritis, Chronic systolic CHF (congestive heart failure) (HCC), Dilated cardiomyopathy (HCC), Episode of dizziness, secondary to cardiomyopathy possibly  (10/25/2014), Essential hypertension, Hypertensive heart disease, LBBB (left bundle branch block), NICM (nonischemic cardiomyopathy) (HCC), and Tobacco abuse.   Presented after found down on the ground as a code stroke - for left sided weakness. I was able to gather history from the patient and the daughter via phone. The patient was last known well somewhere around 10 PM last night and had had a fall somewhere around 1 AM.  She called her daughter who did not see her phone till later in the morning and when she went to check on her, she was on the floor.  It took 2 people to get her up but then noticed that she had slurred speech.  They called EMS who noted her to have slurred speech, left facial droop and left-sided weakness and activated a code stroke for a LVO positive stroke because of their fast ED screen score of 4. She was evaluated emergently in the emergency department-see detailed exam below. Not a candidate for IV TNKase being outside the window   LKW: 2200 08/24/23 Modified rankin score: 1-No significant post stroke disability and can perform usual duties with stroke symptoms-requires minimal help from family but is able to do all ADLs by herself IV Thrombolysis: No-outside the window EVT: No LVO NIHSS 1a Level of Conscious.: 0 1b LOC Questions: 1 1c LOC Commands: 0 2 Best Gaze: 0 3 Visual: 0 4 Facial Palsy: 1 5a Motor Arm - left: 0 5b Motor Arm - Right: 0 6a Motor Leg - Left: 2 6b Motor Leg - Right: 0 7 Limb  Ataxia: 0 8 Sensory: 0 9 Best Language: 0 10 Dysarthria: 1 11 Extinct. and Inatten.: 0 TOTAL: 5    ROS  Comprehensive ROS performed and pertinent positives documented in HPI   Past History   Past Medical History:  Diagnosis Date   Arthritis    "maybe in my hands/fingers; not terrible" (07/12/2015)   Chronic systolic CHF (congestive heart failure) (HCC)    Dilated cardiomyopathy (HCC)    Episode of dizziness, secondary to cardiomyopathy possibly  10/25/2014   Essential hypertension    Hypertensive heart disease    LBBB (left bundle branch block)    a. noted on 10/24/14 admission for dizziness    NICM (nonischemic cardiomyopathy) (HCC)    a. Cath 10/29/14: normal coronary arteries (tortuous, compatible with hypertension). b. s/p [employer] BiV-ICD 06/2015.   Tobacco abuse     Past Surgical History:  Procedure Laterality Date   ABDOMINAL HYSTERECTOMY  1970's   APPENDECTOMY     BI-VENTRICULAR IMPLANTABLE CARDIOVERTER DEFIBRILLATOR  (CRT-D)  07/11/2015   BIV ICD GENERATOR CHANGEOUT N/A 03/15/2020   Procedure: BIV ICD GENERATOR CHANGEOUT;  Surgeon: Marinus Maw, MD;  Location: [facility] INVASIVE CV LAB;  Service: Cardiovascular;  Laterality: N/A;   CARDIAC CATHETERIZATION     EP IMPLANTABLE DEVICE N/A 07/11/2015   Procedure: BiV ICD Insertion CRT-D;  Surgeon: Marinus Maw, MD;  Location: [facility] INVASIVE CV LAB;  Service: Cardiovascular;  Laterality: N/A;   LEFT HEART CATHETERIZATION WITH CORONARY ANGIOGRAM N/A 10/29/2014   Procedure: LEFT HEART  CATHETERIZATION WITH CORONARY ANGIOGRAM;  Surgeon: Lesleigh Noe, MD;  Location: [facility] CATH LAB;  Service: Cardiovascular;  Laterality: N/A;   TONSILLECTOMY     TUBAL LIGATION  1970's    Family History: Family History  Problem Relation Age of Onset   Arrhythmia Mother    Hypertension Mother    Diabetes Mother    Heart disease Mother    Cancer Father    Lung cancer Sister    Healthy Paternal Aunt    Heart attack Neg Hx    Stroke Neg Hx      Social History  reports that she has been smoking cigarettes. She has a 27.5 pack-year smoking history. She has never used smokeless tobacco. She reports that she does not drink alcohol and does not use drugs.  Allergies  Allergen Reactions   Shellfish Allergy Itching    Medications  No current facility-administered medications for this encounter.  Current Outpatient Medications:    atorvastatin (LIPITOR) 20 MG tablet, Take 20 mg by mouth daily., Disp: , Rfl:    carvedilol (COREG) 12.5 MG tablet, Take 1 tablet (12.5 mg total) by mouth 2 (two) times daily with a meal., Disp: 180 tablet, Rfl: 2   irbesartan (AVAPRO) 300 MG tablet, Take 1 tablet (300 mg total) by mouth daily., Disp: 30 tablet, Rfl: 0   Vitamin D, Ergocalciferol, (DRISDOL) 50000 UNITS CAPS capsule, Take 50,000 Units by mouth 2 (two) times a week. , Disp: , Rfl:   Vitals   Vitals:   08-26-23 0800  Weight: 89.6 kg    Body mass index is 30.94 kg/m.  Physical Exam   Constitutional: Appears well-developed and well-nourished.  Psych: Affect appropriate to situation.  Eyes: No scleral injection.  HENT: No OP obstruction.  Head: Normocephalic.  Cardiovascular: Normal rate and regular rhythm.  Respiratory: Effort normal, non-labored breathing.  GI: Soft.  No distension. There is no tenderness.  Skin: WDI.   Neurologic Examination  Awake alert oriented to self, the fact that she is in the hospital, got her age correct, got the month wrong as December. Mild to moderate dysarthria No evidence of aphasia Able to follow multistep commands Cranial nerves: Pupils are equal round react light, extraocular movements are unhindered, visual fields are full, facial symmetry is deranged-has left lower facial weakness both at rest and on smiling.  Auditory acuity intact.  Tongue and palate midline. Motor examination with drift in the left lower extremity and also in the right lower extremity but the left lower extremity is  weaker than the right.  There was no drift in the upper extremities on examination Sensation is intact with no evidence of extinction on double Samtani stimulation Coordination examination with no gross dysmetria    Labs/Imaging/Neurodiagnostic studies   CBC:  Recent Labs  Lab 08-26-2023 0823 08-26-23 0830  WBC 13.9*  --   NEUTROABS 11.6*  --   HGB 14.9 15.6*  HCT 45.3 46.0  MCV 88.6  --   PLT 226  --    Basic Metabolic Panel:  Lab Results  Component Value Date   NA 141 08-26-23   K 3.7 Aug 26, 2023   CO2 16 (L) 04/03/2021   GLUCOSE 170 (H) 08/26/2023   BUN 17 26-Aug-2023   CREATININE 0.90 08/26/2023   CALCIUM 5.4 (LL) 04/03/2021   GFRNONAA >60 04/03/2021   GFRAA 66 03/12/2020   Lipid Panel: No results found for: "LDLCALC" HgbA1c: No results found for: "HGBA1C" Urine Drug Screen: No results found for: "LABOPIA", "COCAINSCRNUR", "  LABBENZ", "AMPHETMU", "THCU", "LABBARB"  Alcohol Level No results found for: "ETH" INR  Lab Results  Component Value Date   INR 1.00 10/29/2014   APTT No results found for: "APTT"  CT Head without contrast(Personally reviewed): Chronic white matter disease.  Aspects 10.  No bleed.  CT angio Head and Neck with contrast, CT perfusion (Personally reviewed): No ELVO, perfusion study with no perfusion deficit  ASSESSMENT   LYLIA WARRENS is a 82 y.o. female  has a past medical history of Arthritis, Chronic systolic CHF (congestive heart failure) (HCC), Dilated cardiomyopathy (HCC), Episode of dizziness, secondary to cardiomyopathy possibly  (10/25/2014), Essential hypertension, Hypertensive heart disease, LBBB (left bundle branch block), NICM (nonischemic cardiomyopathy) (HCC), and Tobacco abuse.  Brought in for evaluation as a code stroke for left-sided weakness.  Last known well around 10 PM yesterday when she went to bed, woke up to go to the bathroom and had a fall somewhere around 1 AM-called family who did not check their phone till about  later in the morning and called EMS who noted left-sided weakness.  Activated code stroke and brought for emergent evaluation. Outside the window for IV thrombolysis, no LVO for EVT. Likely small vessel etiology infarct versus cardioembolic strokes.   RECOMMENDATIONS  Admit to hospitalist Frequent rechecks Telemetry MRI brain without contrast-has a pacemaker-MRI order has been put-would appreciate cardiology and radiology assistance with obtaining this MRI during business hours. 2D echo A1c Lipid panel PT OT Speech therapy Permissive hypertension-allow for blood pressure to be as high as 220 and treat only on a as needed basis if its greater than 220 for the next 24 to 48 hours.  Then start normalizing blood pressure for goal at discharge to normotension. Aspirin 81 Atorvastatin 80 Stroke team to follow Plan discussed with Langston Masker, PA-C in the ED. ______________________________________________________________________    Signed, Milon Dikes, MD [facility]

## 2023-08-25 NOTE — Discharge Planning (Signed)
RNCM met with family in lobby and escorted them to pt room at request of Neurology team.

## 2023-08-25 NOTE — Assessment & Plan Note (Signed)
Possible, see CT angiogram finding. Will get US duplex stat and proceed from there.

## 2023-08-25 NOTE — Assessment & Plan Note (Signed)
1. 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 2. Small acute infarcts within the right insula and right subinsular white matter.  Neurologically still having dysarthria -moderate and somnolence. Passed swallow screen. Will treat ith aspirin and statin this evening. Liberlised HTN control. Pending echo. Maintain on telemetry and neurocheck. Appreciate there input and look forward to follow up. They are ok with BP upto 220 mmhg. Will c.w. aspirin, statin, plavix X 21 days.

## 2023-08-25 NOTE — H&P (Addendum)
History and Physical    Patient: Nicole Chan ZOX:[id number] DOB: May 15, 1941 DOA: 08/25/2023 DOS: the patient was seen and examined on 08/25/2023 PCP: Andi Devon, MD  Patient coming from: Home  Chief Complaint:  Chief Complaint  Patient presents with   Code Stroke   HPI: Nicole Chan is a 82 y.o. female with medical history significant of prior hypertension, tobacco abuse.  History is obtained from the patient, which is limited, ER signout as well as from the son at the bedside.  Patient typically suffers from insomnia. She was last known to be normal by family at approximately 10 PM. and reports having had a fall in her bedroom last night, she cannot tell me the time or exactly why she fell down, see exam below.  Patient does not give any report of having contacted anybody last night.  However from secondary sources it is learned that the patient did make a phone call in an attempt to reach her daughter last night at approximately 1 AM.  Patient is a woman of little worse right now and does not give much description of her time in the bedroom floor last night.  Patient was finally discovered by family members at approximately 7:30 AM this morning and brought to [facility].  Per report patient was found to have urine smell.  Was noted to have dysarthria and some right arm weakness as well as poor mentation.  See workup in the ER below.  Patient continues to have fatigue/poor mentation.  Patient is still hard to understand due to her slurred speech.  However family, at the bedside, feels that the content of her speech is normal/cognizant.  Medical evaluation is sought.  There is no report of prior similar episodes, seizures.  Patinet is reporting suprapubic pain.   Review of Systems: As mentioned in the history of present illness. All other systems reviewed and are negative. Review of system is limited as patinet is not engaging in prompt alert awake conversation. For  example, patinet denies having attempted to reach daughter last evening - althoguth daughter did get a call, that she missed, at the time. Past Medical History:  Diagnosis Date   Arthritis    "maybe in my hands/fingers; not terrible" (07/12/2015)   Chronic systolic CHF (congestive heart failure) (HCC)    Dilated cardiomyopathy (HCC)    Episode of dizziness, secondary to cardiomyopathy possibly  10/25/2014   Essential hypertension    Hypertensive heart disease    LBBB (left bundle branch block)    a. noted on 10/24/14 admission for dizziness    NICM (nonischemic cardiomyopathy) (HCC)    a. Cath 10/29/14: normal coronary arteries (tortuous, compatible with hypertension). b. s/p [employer] BiV-ICD 06/2015.   Tobacco abuse    Past Surgical History:  Procedure Laterality Date   ABDOMINAL HYSTERECTOMY  1970's   APPENDECTOMY     BI-VENTRICULAR IMPLANTABLE CARDIOVERTER DEFIBRILLATOR  (CRT-D)  07/11/2015   BIV ICD GENERATOR CHANGEOUT N/A 03/15/2020   Procedure: BIV ICD GENERATOR CHANGEOUT;  Surgeon: Marinus Maw, MD;  Location: [facility] INVASIVE CV LAB;  Service: Cardiovascular;  Laterality: N/A;   CARDIAC CATHETERIZATION     EP IMPLANTABLE DEVICE N/A 07/11/2015   Procedure: BiV ICD Insertion CRT-D;  Surgeon: Marinus Maw, MD;  Location: [facility] INVASIVE CV LAB;  Service: Cardiovascular;  Laterality: N/A;   LEFT HEART CATHETERIZATION WITH CORONARY ANGIOGRAM N/A 10/29/2014   Procedure: LEFT HEART CATHETERIZATION WITH CORONARY ANGIOGRAM;  Surgeon: Lesleigh Noe, MD;  Location: [facility] CATH LAB;  Service: Cardiovascular;  Laterality: N/A;   TONSILLECTOMY     TUBAL LIGATION  1970's   Social History:  reports that she has been smoking cigarettes. She has a 27.5 pack-year smoking history. She has never used smokeless tobacco. She reports that she does not drink alcohol and does not use drugs.  Allergies  Allergen Reactions   Shellfish Allergy Itching    Family History  Problem Relation Age of Onset    Arrhythmia Mother    Hypertension Mother    Diabetes Mother    Heart disease Mother    Cancer Father    Lung cancer Sister    Healthy Paternal Aunt    Heart attack Neg Hx    Stroke Neg Hx     Prior to Admission medications   Medication Sig Start Date End Date Taking? Authorizing Provider  atorvastatin (LIPITOR) 20 MG tablet Take 20 mg by mouth daily.   Yes [provider]  carvedilol (COREG) 12.5 MG tablet Take 1 tablet (12.5 mg total) by mouth 2 (two) times daily with a meal. 07/19/23  Yes Marinus Maw, MD  irbesartan (AVAPRO) 300 MG tablet Take 1 tablet (300 mg total) by mouth daily. 04/03/21  Yes Jacalyn Lefevre, MD  Vitamin D, Ergocalciferol, (DRISDOL) 50000 UNITS CAPS capsule Take 50,000 Units by mouth 2 (two) times a week.    Yes [provider]    Physical Exam: Vitals:   08/25/23 1115 08/25/23 1130 08/25/23 1230 08/25/23 1500  BP: (!) 159/112 (!) 148/135 (!) 181/92 (!) 150/66  Pulse: 98   95  Resp: 17 16 20 17   Temp:    98.4 F (36.9 C)  TempSrc:    Oral  SpO2: 100%   100%  Weight:       General: Patient is awake, however seems less alert, disinterested in the conversation.  When directly asked questions patient does answer, her speech however is at least moderately dysarthric and difficult to understand.  Patient is able to give me her location accurately she is able to give me her date of birth.  It is able to answer some questions such as how long she has had insomnia for.  No distress is apparent. Respiratory exam: Bilateral intravesicular Cardiovascular exam S1-S2 normal Abdomen all quadrants are soft including suprapubic area.  No tenderness Extremities are warm without edema.  Patient is able to demonstrate at least 4/5 strength bilaterally, which is symmetric.  I do not appreciate any focal weakness.  I do not appreciate any clinical fractures on having the patient flexed her hip or knee.  Her suprapubic pain is unrelated to movement at this time.   No focal spinal tenderness.  No chest pain or tenderness Neurologically.  Patient has dysarthria, content of her speech is felt to be within normal limit, she is not demonstrating word finding difficulties.  Her facies appear symmetric, no obvious facial palsy is noted.  Gazes are preserved, although there is some weakness of the left side gaze which could be due to inattention/disinterested. Data Reviewed:  Labs on Admission:  Results for orders placed or performed during the hospital encounter of 08/25/23 (from the past 24 hour(s))  Ethanol     Status: None   Collection Time: 08/25/23  8:23 AM  Result Value Ref Range   Alcohol, Ethyl (B) <10 <10 mg/dL  Protime-INR     Status: None   Collection Time: 08/25/23  8:23 AM  Result Value Ref Range  Prothrombin Time 14.3 11.4 - 15.2 seconds   INR 1.1 0.8 - 1.2  APTT     Status: None   Collection Time: 08/25/23  8:23 AM  Result Value Ref Range   aPTT 29 24 - 36 seconds  CBC     Status: Abnormal   Collection Time: 08/25/23  8:23 AM  Result Value Ref Range   WBC 13.9 (H) 4.0 - 10.5 K/uL   RBC 5.11 3.87 - 5.11 MIL/uL   Hemoglobin 14.9 12.0 - 15.0 g/dL   HCT 16.1 09.6 - 04.5 %   MCV 88.6 80.0 - 100.0 fL   MCH 29.2 26.0 - 34.0 pg   MCHC 32.9 30.0 - 36.0 g/dL   RDW 40.9 81.1 - 91.4 %   Platelets 226 150 - 400 K/uL   nRBC 0.0 0.0 - 0.2 %  Differential     Status: Abnormal   Collection Time: 08/25/23  8:23 AM  Result Value Ref Range   Neutrophils Relative % 83 %   Neutro Abs 11.6 (H) 1.7 - 7.7 K/uL   Lymphocytes Relative 11 %   Lymphs Abs 1.5 0.7 - 4.0 K/uL   Monocytes Relative 6 %   Monocytes Absolute 0.8 0.1 - 1.0 K/uL   Eosinophils Relative 0 %   Eosinophils Absolute 0.1 0.0 - 0.5 K/uL   Basophils Relative 0 %   Basophils Absolute 0.0 0.0 - 0.1 K/uL   Immature Granulocytes 0 %   Abs Immature Granulocytes 0.05 0.00 - 0.07 K/uL  Comprehensive metabolic panel     Status: Abnormal   Collection Time: 08/25/23  8:23 AM  Result Value  Ref Range   Sodium 138 135 - 145 mmol/L   Potassium 3.7 3.5 - 5.1 mmol/L   Chloride 106 98 - 111 mmol/L   CO2 19 (L) 22 - 32 mmol/L   Glucose, Bld 169 (H) 70 - 99 mg/dL   BUN 17 8 - 23 mg/dL   Creatinine, Ser 7.82 0.44 - 1.00 mg/dL   Calcium 95.6 (H) 8.9 - 10.3 mg/dL   Total Protein 6.8 6.5 - 8.1 g/dL   Albumin 3.6 3.5 - 5.0 g/dL   AST 27 15 - 41 U/L   ALT 23 0 - 44 U/L   Alkaline Phosphatase 73 38 - 126 U/L   Total Bilirubin 1.5 (H) <1.2 mg/dL   GFR, Estimated 58 (L) >60 mL/min   Anion gap 13 5 - 15  CBG monitoring, ED     Status: Abnormal   Collection Time: 08/25/23  8:27 AM  Result Value Ref Range   Glucose-Capillary 159 (H) 70 - 99 mg/dL  I-stat chem 8, ED     Status: Abnormal   Collection Time: 08/25/23  8:30 AM  Result Value Ref Range   Sodium 141 135 - 145 mmol/L   Potassium 3.7 3.5 - 5.1 mmol/L   Chloride 108 98 - 111 mmol/L   BUN 17 8 - 23 mg/dL   Creatinine, Ser 2.13 0.44 - 1.00 mg/dL   Glucose, Bld 086 (H) 70 - 99 mg/dL   Calcium, Ion 5.78 4.69 - 1.40 mmol/L   TCO2 22 22 - 32 mmol/L   Hemoglobin 15.6 (H) 12.0 - 15.0 g/dL   HCT 62.9 52.8 - 41.3 %  Urine rapid drug screen (hosp performed)     Status: None   Collection Time: 08/25/23 10:35 AM  Result Value Ref Range   Opiates NONE DETECTED NONE DETECTED   Cocaine NONE DETECTED NONE DETECTED  Benzodiazepines NONE DETECTED NONE DETECTED   Amphetamines NONE DETECTED NONE DETECTED   Tetrahydrocannabinol NONE DETECTED NONE DETECTED   Barbiturates NONE DETECTED NONE DETECTED  Urinalysis, Routine w reflex microscopic -Urine, Clean Catch     Status: Abnormal   Collection Time: 08/25/23 10:35 AM  Result Value Ref Range   Color, Urine YELLOW YELLOW   APPearance CLEAR CLEAR   Specific Gravity, Urine 1.026 1.005 - 1.030   pH 7.0 5.0 - 8.0   Glucose, UA NEGATIVE NEGATIVE mg/dL   Hgb urine dipstick SMALL (A) NEGATIVE   Bilirubin Urine NEGATIVE NEGATIVE   Ketones, ur NEGATIVE NEGATIVE mg/dL   Protein, ur 30 (A)  NEGATIVE mg/dL   Nitrite NEGATIVE NEGATIVE   Leukocytes,Ua NEGATIVE NEGATIVE   RBC / HPF 11-20 0 - 5 RBC/hpf   WBC, UA 0-5 0 - 5 WBC/hpf   Bacteria, UA RARE (A) NONE SEEN   Squamous Epithelial / HPF 0-5 0 - 5 /HPF  CK     Status: None   Collection Time: 08/25/23  3:35 PM  Result Value Ref Range   Total CK 144 38 - 234 U/L  Creatinine, serum     Status: None   Collection Time: 08/25/23  3:35 PM  Result Value Ref Range   Creatinine, Ser 0.81 0.44 - 1.00 mg/dL   GFR, Estimated >06 >26 mL/min   Basic Metabolic Panel: Recent Labs  Lab 08/25/23 0823 08/25/23 0830 08/25/23 1535  NA 138 141  --   K 3.7 3.7  --   CL 106 108  --   CO2 19*  --   --   GLUCOSE 169* 170*  --   BUN 17 17  --   CREATININE 0.98 0.90 0.81  CALCIUM 10.4*  --   --    Liver Function Tests: Recent Labs  Lab 08/25/23 0823  AST 27  ALT 23  ALKPHOS 73  BILITOT 1.5*  PROT 6.8  ALBUMIN 3.6   No results for input(s): "LIPASE", "AMYLASE" in the last 168 hours. No results for input(s): "AMMONIA" in the last 168 hours. CBC: Recent Labs  Lab 08/25/23 0823 08/25/23 0830  WBC 13.9*  --   NEUTROABS 11.6*  --   HGB 14.9 15.6*  HCT 45.3 46.0  MCV 88.6  --   PLT 226  --    Cardiac Enzymes: Recent Labs  Lab 08/25/23 1535  CKTOTAL 144    BNP (last 3 results) No results for input(s): "PROBNP" in the last 8760 hours. CBG: Recent Labs  Lab 08/25/23 0827  GLUCAP 159*    Radiological Exams on Admission:  MR BRAIN WO CONTRAST  Result Date: 08/25/2023 CLINICAL DATA:  Provided history: Stroke, follow-up. EXAM: MRI HEAD WITHOUT CONTRAST TECHNIQUE: Multiplanar, multiecho pulse sequences of the brain and surrounding structures were obtained without intravenous contrast. COMPARISON:  Non-contrast head CT and CT angiogram head/neck 08/25/2023. FINDINGS: Brain: No age advanced or lobar predominant parenchymal atrophy. Mild cerebellar atrophy. 1.5 x 2.3 x 2.9 cm acute infarct within the right corona  radiata/basal ganglia. Small acute infarcts within the right insula and right subinsular white matter. Chronic lacunar infarcts within bilateral cerebral hemispheric white matter and basal ganglia. Small focus of diffusion-weighted signal hyperintensity at site of a chronic infarct in the left basal ganglia, likely reflecting susceptibility artifact from chronic blood products at this site. Background advanced patchy and confluent T2 FLAIR hyperintense signal abnormality within the cerebral white matter, nonspecific but compatible with chronic small vessel ischemic disease. Small chronic infarcts  within the left cerebellar hemisphere. 9 mm extra-axial mass along the right aspect of the anterior falx, likely reflecting a meningioma (for instance as seen on series 11, image 41) (series 15, image 22). Contact upon the underlying right lobe without underlying parenchymal edema. There are a few chronic microhemorrhages scattered within the supratentorial brain. Partially empty sella turcica. No extra-axial fluid collection. No midline shift. Vascular: Maintained flow voids within the proximal large arterial vessels. 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 Skull and upper cervical spine: Indeterminate well-circumscribed 10 mm FLAIR hyperintense and T1 hypointense lesion within the left frontal calvarium (for instance as seen on series 11, image 44). Sinuses/Orbits: No mass or acute finding within the imaged orbits. Minimal mucosal thickening within the right ethmoid and left maxillary sinuses. IMPRESSION: 1. 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 2. Small acute infarcts within the right insula and right subinsular white matter. 3. Background advanced chronic small ischemic disease with multiple chronic infarcts, as described. 4. 9 mm extra-axial mass along the right aspect of the anterior falx, likely reflecting a  meningioma. Contact upon the underlying right frontal lobe without underlying parenchymal edema 5. Mild cerebellar atrophy. 6. Indeterminate 10 mm osseous lesion within the left frontal calvarium. Consider a follow-up brain MRI in 3 months to ensure stability. Electronically Signed   By: Jackey Loge D.O.   On: 08/25/2023 13:59   DG Chest Port 1 View  Result Date: 08/25/2023 CLINICAL DATA:  Weakness status post fall EXAM: PORTABLE CHEST 1 VIEW COMPARISON:  Chest radiograph dated 04/03/2021 FINDINGS: Lines/tubes: Left chest wall ICD leads project over the right atrium and ventricle and tributary of the coronary sinus. Lungs: Low lung volumes with bronchovascular crowding. No focal consolidation. Pleura: No pneumothorax or pleural effusion. Heart/mediastinum: The heart size and mediastinal contours are within normal limits. Bones: No radiographic finding of acute displaced fracture. IMPRESSION: 1. Low lung volumes with bronchovascular crowding. No focal consolidation. 2.  No radiographic finding of acute displaced fracture. Electronically Signed   By: Agustin Cree M.D.   On: 08/25/2023 10:29   CT ANGIO HEAD NECK W WO CM W PERF (CODE STROKE)  Result Date: 08/25/2023 CLINICAL DATA:  Neuro deficit, acute, stroke suspected EXAM: CT ANGIOGRAPHY HEAD AND NECK CT PERFUSION BRAIN TECHNIQUE: Multidetector CT imaging of the head and neck was performed using the standard protocol during bolus administration of intravenous contrast. Multiplanar CT image reconstructions and MIPs were obtained to evaluate the vascular anatomy. Carotid stenosis measurements (when applicable) are obtained utilizing NASCET criteria, using the distal internal carotid diameter as the denominator. Multiphase CT imaging of the brain was performed following IV bolus contrast injection. Subsequent parametric perfusion maps were calculated using RAPID software. RADIATION DOSE REDUCTION: This exam was performed according to the departmental  dose-optimization program which includes automated exposure control, adjustment of the mA and/or kV according to patient size and/or use of iterative reconstruction technique. CONTRAST:  OMNIPAQUE IOHEXOL 350 MG/ML SOLN COMPARISON:  Same day CT head. FINDINGS: CTA NECK FINDINGS Aortic arch: Moderate stenosis of the right subclavian artery origin. Intraluminal filling defect within the proximal subclavian artery which is suspicious for thrombus. Ulcerated atherosclerosis of the left subclavian artery origin with mild narrowing. Right carotid system: Atherosclerosis at the carotid bifurcation with approximately 30% stenosis of the ICA origin. Left carotid system: Atherosclerosis at the carotid bifurcation involving the proximal ICA with proximally 50% stenosis of the proximal  ICA. Vertebral arteries: Severe bilateral vertebral artery origin stenosis. Both vertebral arteries are patent. Left dominant. Skeleton: No acute abnormality on limited assessment. Multilevel degenerative change in the cervical spine. Other neck: Subcentimeter thyroid nodules which do not require imaging follow-up (ref: J Am Coll Radiol. 2015 Feb;12(2): 143-50). Upper chest: Visualized lung apices are clear. Review of the MIP images confirms the above findings CTA HEAD FINDINGS Anterior circulation: Bilateral intracranial ICAs are patent with mild to moderate narrowing bilaterally. 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 Posterior circulation: Bilateral intradural vertebral arteries, basilar artery and bilateral posterior cerebral arteries are are patent. Moderate right proximal P2 PCA stenosis. Venous sinuses: As permitted by contrast timing, patent. Review of the MIP images confirms the above findings CT Brain Perfusion Findings: ASPECTS: 10. CBF (<30%) Volume: 0mL Perfusion (Tmax>6.0s) volume: 0mL Mismatch Volume: 0mL Infarction Location:None identified. IMPRESSION: CTA head: 1.  No emergent large vessel occlusion. 2. Moderate right proximal P2 PCA stenosis. 3. 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 CTA neck: 1. Approximately 50% stenosis of the proximal ICAs bilaterally in the neck. 2. Severe bilateral vertebral artery origin stenosis. 3. Moderate atherosclerotic narrowing of the right subclavian artery origin. Intraluminal filling defect within the subclavian artery in this region is suspicious for nonocclusive thrombus versus atherosclerotic plaque extending into the lumen. Right upper extremity ultrasound may be useful to further evaluate and also exclude more distal clot in the arm. 4. Aortic Atherosclerosis (ICD10-I70.0). CT perfusion: No evidence of core infarct or penumbra. Electronically Signed   By: Feliberto Harts M.D.   On: 08/25/2023 09:39   CT HEAD CODE STROKE WO CONTRAST  Result Date: 08/25/2023 CLINICAL DATA:  Code stroke. Neuro deficit with acute stroke suspected facial droop with left arm and leg weakness. EXAM: CT HEAD WITHOUT CONTRAST TECHNIQUE: Contiguous axial images were obtained from the base of the skull through the vertex without intravenous contrast. RADIATION DOSE REDUCTION: This exam was performed according to the departmental dose-optimization program which includes automated exposure control, adjustment of the mA and/or kV according to patient size and/or use of iterative reconstruction technique. COMPARISON:  None Available. FINDINGS: Brain: No evidence of acute infarction, hemorrhage, hydrocephalus, extra-axial collection or mass lesion/mass effect. Extensive chronic small vessel ischemia with gliosis and lacunar infarcts in the deep gray nuclei. None of these are clearly acute. Small chronic infarct in the left cerebellum. Vascular: Limited at the proximal MCA due to streak artifact. No convincing hyperdensity. Skull: No acute or aggressive finding. Lucency at the superior left frontal bone is likely a incidental hemangioma.  Sinuses/Orbits: No acute finding. Other: These results were communicated to Dr. Wilford Corner at 8:44 am on 08/25/2023 by epic chat. ASPECTS [facility] Stroke Program Early CT Score) - Ganglionic level infarction (caudate, lentiform nuclei, internal capsule, insula, M1-M3 cortex): 7 - Supraganglionic infarction (M4-M6 cortex): 3 Total score (0-10 with 10 being normal): 10 IMPRESSION: 1. No acute finding. 2. Extensive chronic small vessel ischemia. Electronically Signed   By: Tiburcio Pea M.D.   On: 08/25/2023 08:46    EKG: Independently reviewed. Paced rhythm, tachycarida.   Assessment and Plan: * Stroke (cerebrum) (HCC) 1. 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 2. Small acute infarcts within the right insula and right subinsular white matter.  Neurologically still having dysarthria -moderate and somnolence. Passed swallow screen. Will treat ith aspirin and statin this evening. Liberlised HTN control. Pending echo. Maintain on telemetry  and neurocheck. Appreciate there input and look forward to follow up. They are ok with BP upto 220 mmhg. Will c.w. aspirin, statin, plavix X 21 days.  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 Will get US duplex stat and proceed from there.  Brain mass 1) 9 mm extra-axial mass along the right aspect of the anterior falx, likely reflecting a meningioma. Contact upon the underlying right frontal lobe without underlying parenchymal edema. Outpatient follow up.  2) Indeterminate 10 mm osseous lesion within the left frontal calvarium. Consider a follow-up brain MRI in 3 months to ensure stability. I will hceck spep t/r/o Mutliple myeloma in the setting of hyperclacemia.  Hypercalcemia  mild. Paitnet is complaining of pain in suprapubic area, will check Xray t/r/o fracture. Will hydrate patient this evening (patinet on 100 cc/hr of NS infusion). Will check calcium, ionzied, phos, vitamin and pth again in  AM. Anticipate resolution by then. Patient does take ergocalciferol 50 k twice a week - seems rather high to me. Hold for now.  Leukocytosis Neutrophil predominant, incidentally noted without any fever without any findings on chest x-ray or UA.  Felt to be likely due to stress of being on the floor for prolonged period of time last night.  Will continue with clinical monitoring.  Tobacco use This is current, we discussed that tobacco use does count as a major risk factor for stroke first precipitation.  And abstinence would have 100% be advised.  Patient seemed to understand.  We will monitor clinically at this time      Advance Care Planning:   Code Status: Full Code discussed with patinet and family  Consults: neurlogy on board.  Family Communication: MRI result discussed with son at bedside and patinet.  Severity of Illness: The appropriate patient status for this patient is INPATIENT. Inpatient status is judged to be reasonable and necessary in order to provide the required intensity of service to ensure the patient's safety. The patient's presenting symptoms, physical exam findings, and initial radiographic and laboratory data in the context of their chronic comorbidities is felt to place them at high risk for further clinical deterioration. Furthermore, it is not anticipated that the patient will be medically stable for discharge from the hospital within 2 midnights of admission.   * I certify that at the point of admission it is my clinical judgment that the patient will require inpatient hospital care spanning beyond 2 midnights from the point of admission due to high intensity of service, high risk for further deterioration and high frequency of surveillance required.*  Author: Nolberto Hanlon, MD 08/25/2023 4:38 PM  For on call review [email or web address].

## 2023-08-25 NOTE — Plan of Care (Signed)
  Problem: Education: Goal: Knowledge of disease or condition will improve Outcome: Progressing   Problem: Coping: Goal: Will identify appropriate support needs Outcome: Progressing   Problem: Self-Care: Goal: Ability to participate in self-care as condition permits will improve Outcome: Progressing   Problem: Nutrition: Goal: Risk of aspiration will decrease Outcome: Progressing   Problem: Education: Goal: Knowledge of General Education information will improve Description: Including pain rating scale, medication(s)/side effects and non-pharmacologic comfort measures Outcome: Progressing   Problem: Clinical Measurements: Goal: Respiratory complications will improve Outcome: Progressing Goal: Cardiovascular complication will be avoided Outcome: Progressing   Problem: Skin Integrity: Goal: Risk for impaired skin integrity will decrease Outcome: Progressing

## 2023-08-25 NOTE — Progress Notes (Signed)
Patient received from ED, alert and oriented X4, vital signs stable, tele box connected, family members at bedside, call bell within reach, bed alarm on, will continue to monitor

## 2023-08-25 NOTE — ED Notes (Signed)
Pt. Is on monitor. CCMD is notified.

## 2023-08-25 NOTE — ED Notes (Signed)
ED TO INPATIENT HANDOFF REPORT  ED Nurse Name and Phone #: Chasity RN Marchelle Folks RN [phone]  S Name/Age/Gender Nicole Chan 82 y.o. female Room/Bed: 017C/017C  Code Status   Code Status: Full Code  Home/SNF/Other Home Patient oriented to: self, place, time, and situation Is this baseline? Yes   Triage Complete: Triage complete  Chief Complaint Stroke (cerebrum) [facility]) [I63.9]  Triage Note Pt bib gcems from home. A&Ox4, LKW 08/24/2023 at 2200. Pt was found laying on floor saturated in urine.  Pt c/o urinary frequency. Pt appeared to have L side facial droop, left arm/lef drift. EMS noted a right side fixed gazed. Pt states she is not on any blood thinners. 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    Allergies Allergies  Allergen Reactions   Shellfish Allergy Itching    Level of Care/Admitting Diagnosis ED Disposition     ED Disposition  Admit   Condition  --   Comment  Hospital Area: [facility] [[id number]]  Level of Care: Telemetry Medical [[id number]]  May admit patient to [facility] or [facility] if equivalent level of care is available:: No  Covid Evaluation: Asymptomatic - no recent exposure (last 10 days) testing not required  Diagnosis: Stroke (cerebrum) [facility]) [[id number]]  Admitting Physician: Nolberto Hanlon [[id number]]  Attending Physician: Nolberto Hanlon [[id number]]  Certification:: I certify this patient will need inpatient services for at least 2 midnights  Expected Medical Readiness: 08/27/2023          B Medical/Surgery History Past Medical History:  Diagnosis Date   Arthritis    "maybe in my hands/fingers; not terrible" (07/12/2015)   Chronic systolic CHF (congestive heart failure) (HCC)    Dilated cardiomyopathy (HCC)    Episode of dizziness, secondary to cardiomyopathy possibly  10/25/2014   Essential hypertension    Hypertensive heart disease    LBBB (left bundle branch block)    a. noted on 10/24/14 admission for dizziness    NICM (nonischemic  cardiomyopathy) (HCC)    a. Cath 10/29/14: normal coronary arteries (tortuous, compatible with hypertension). b. s/p [employer] BiV-ICD 06/2015.   Tobacco abuse    Past Surgical History:  Procedure Laterality Date   ABDOMINAL HYSTERECTOMY  1970's   APPENDECTOMY     BI-VENTRICULAR IMPLANTABLE CARDIOVERTER DEFIBRILLATOR  (CRT-D)  07/11/2015   BIV ICD GENERATOR CHANGEOUT N/A 03/15/2020   Procedure: BIV ICD GENERATOR CHANGEOUT;  Surgeon: Marinus Maw, MD;  Location: [facility] INVASIVE CV LAB;  Service: Cardiovascular;  Laterality: N/A;   CARDIAC CATHETERIZATION     EP IMPLANTABLE DEVICE N/A 07/11/2015   Procedure: BiV ICD Insertion CRT-D;  Surgeon: Marinus Maw, MD;  Location: [facility] INVASIVE CV LAB;  Service: Cardiovascular;  Laterality: N/A;   LEFT HEART CATHETERIZATION WITH CORONARY ANGIOGRAM N/A 10/29/2014   Procedure: LEFT HEART CATHETERIZATION WITH CORONARY ANGIOGRAM;  Surgeon: Lesleigh Noe, MD;  Location: [facility] CATH LAB;  Service: Cardiovascular;  Laterality: N/A;   TONSILLECTOMY     TUBAL LIGATION  1970's     A IV Location/Drains/Wounds Patient Lines/Drains/Airways Status     Active Line/Drains/Airways     Name Placement date Placement time Site Days   Peripheral IV 08/25/23 18 G 1.75" Right Antecubital 08/25/23  0905  Antecubital  less than 1   Peripheral IV 08/25/23 20 G Anterior;Right Wrist 08/25/23  --  Wrist  less than 1            Intake/Output Last 24 hours  Intake/Output Summary (Last 24 hours) at  08/25/2023 1325 Last data filed at 08/25/2023 1041 Gross per 24 hour  Intake --  Output 1000 ml  Net -1000 ml    Labs/Imaging Results for orders placed or performed during the hospital encounter of 08/25/23 (from the past 48 hour(s))  Ethanol     Status: None   Collection Time: 08/25/23  8:23 AM  Result Value Ref Range   Alcohol, Ethyl (B) <10 <10 mg/dL    Comment: (NOTE) Lowest detectable limit for serum alcohol is 10 mg/dL.  For medical purposes only. Performed at  [facility] Lab, [address], [place], [place] [address]   Protime-INR     Status: None   Collection Time: 08/25/23  8:23 AM  Result Value Ref Range   Prothrombin Time 14.3 11.4 - 15.2 seconds   INR 1.1 0.8 - 1.2    Comment: (NOTE) INR goal varies based on device and disease states. Performed at [facility], [address] [place], [place] [address]   APTT     Status: None   Collection Time: 08/25/23  8:23 AM  Result Value Ref Range   aPTT 29 24 - 36 seconds    Comment: Performed at [facility], [address] [place], [place] [address]  CBC     Status: Abnormal   Collection Time: 08/25/23  8:23 AM  Result Value Ref Range   WBC 13.9 (H) 4.0 - 10.5 K/uL   RBC 5.11 3.87 - 5.11 MIL/uL   Hemoglobin 14.9 12.0 - 15.0 g/dL   HCT 91.4 78.2 - 95.6 %   MCV 88.6 80.0 - 100.0 fL   MCH 29.2 26.0 - 34.0 pg   MCHC 32.9 30.0 - 36.0 g/dL   RDW 21.3 08.6 - 57.8 %   Platelets 226 150 - 400 K/uL   nRBC 0.0 0.0 - 0.2 %    Comment: Performed at [facility] Lab, [address] [place], [place] [address]  Differential     Status: Abnormal   Collection Time: 08/25/23  8:23 AM  Result Value Ref Range   Neutrophils Relative % 83 %   Neutro Abs 11.6 (H) 1.7 - 7.7 K/uL   Lymphocytes Relative 11 %   Lymphs Abs 1.5 0.7 - 4.0 K/uL   Monocytes Relative 6 %   Monocytes Absolute 0.8 0.1 - 1.0 K/uL   Eosinophils Relative 0 %   Eosinophils Absolute 0.1 0.0 - 0.5 K/uL   Basophils Relative 0 %   Basophils Absolute 0.0 0.0 - 0.1 K/uL   Immature Granulocytes 0 %   Abs Immature Granulocytes 0.05 0.00 - 0.07 K/uL    Comment: Performed at [facility] Lab, [address] [place], [place] [address]  Comprehensive metabolic panel     Status: Abnormal   Collection Time: 08/25/23  8:23 AM  Result Value Ref Range   Sodium 138 135 - 145 mmol/L   Potassium 3.7 3.5 - 5.1 mmol/L   Chloride 106 98 - 111 mmol/L   CO2 19 (L) 22 - 32 mmol/L   Glucose, Bld 169 (H) 70 - 99 mg/dL    Comment:  Glucose reference range applies only to samples taken after fasting for at least 8 hours.   BUN 17 8 - 23 mg/dL   Creatinine, Ser 1.32 0.44 - 1.00 mg/dL   Calcium 44.0 (H) 8.9 - 10.3 mg/dL   Total Protein 6.8 6.5 - 8.1 g/dL   Albumin 3.6 3.5 - 5.0 g/dL   AST  27 15 - 41 U/L   ALT 23 0 - 44 U/L   Alkaline Phosphatase 73 38 - 126 U/L   Total Bilirubin 1.5 (H) <1.2 mg/dL   GFR, Estimated 58 (L) >60 mL/min    Comment: (NOTE) Calculated using the CKD-EPI Creatinine Equation (2021)    Anion gap 13 5 - 15    Comment: Performed at [facility] Lab, [address] [place], [place] [address]  CBG monitoring, ED     Status: Abnormal   Collection Time: 08/25/23  8:27 AM  Result Value Ref Range   Glucose-Capillary 159 (H) 70 - 99 mg/dL    Comment: Glucose reference range applies only to samples taken after fasting for at least 8 hours.  I-stat chem 8, ED     Status: Abnormal   Collection Time: 08/25/23  8:30 AM  Result Value Ref Range   Sodium 141 135 - 145 mmol/L   Potassium 3.7 3.5 - 5.1 mmol/L   Chloride 108 98 - 111 mmol/L   BUN 17 8 - 23 mg/dL   Creatinine, Ser 6.21 0.44 - 1.00 mg/dL   Glucose, Bld 308 (H) 70 - 99 mg/dL    Comment: Glucose reference range applies only to samples taken after fasting for at least 8 hours.   Calcium, Ion 1.22 1.15 - 1.40 mmol/L   TCO2 22 22 - 32 mmol/L   Hemoglobin 15.6 (H) 12.0 - 15.0 g/dL   HCT 65.7 84.6 - 96.2 %  Urine rapid drug screen (hosp performed)     Status: None   Collection Time: 08/25/23 10:35 AM  Result Value Ref Range   Opiates NONE DETECTED NONE DETECTED   Cocaine NONE DETECTED NONE DETECTED   Benzodiazepines NONE DETECTED NONE DETECTED   Amphetamines NONE DETECTED NONE DETECTED   Tetrahydrocannabinol NONE DETECTED NONE DETECTED   Barbiturates NONE DETECTED NONE DETECTED    Comment: (NOTE) DRUG SCREEN FOR MEDICAL PURPOSES ONLY.  IF CONFIRMATION IS NEEDED FOR ANY PURPOSE, NOTIFY LAB WITHIN 5 DAYS.  LOWEST DETECTABLE LIMITS FOR  URINE DRUG SCREEN Drug Class                     Cutoff (ng/mL) Amphetamine and metabolites    1000 Barbiturate and metabolites    200 Benzodiazepine                 200 Opiates and metabolites        300 Cocaine and metabolites        300 THC                            50 Performed at [facility], [address], [place], [place] [address]   Urinalysis, Routine w reflex microscopic -Urine, Clean Catch     Status: Abnormal   Collection Time: 08/25/23 10:35 AM  Result Value Ref Range   Color, Urine YELLOW YELLOW   APPearance CLEAR CLEAR   Specific Gravity, Urine 1.026 1.005 - 1.030   pH 7.0 5.0 - 8.0   Glucose, UA NEGATIVE NEGATIVE mg/dL   Hgb urine dipstick SMALL (A) NEGATIVE   Bilirubin Urine NEGATIVE NEGATIVE   Ketones, ur NEGATIVE NEGATIVE mg/dL   Protein, ur 30 (A) NEGATIVE mg/dL   Nitrite NEGATIVE NEGATIVE   Leukocytes,Ua NEGATIVE NEGATIVE   RBC / HPF 11-20 0 - 5 RBC/hpf   WBC, UA 0-5 0 - 5 WBC/hpf   Bacteria, UA RARE (  A) NONE SEEN   Squamous Epithelial / HPF 0-5 0 - 5 /HPF    Comment: Performed at [facility] Lab, [address], [place], [place] [address]   DG Chest Port 1 View  Result Date: 08/25/2023 CLINICAL DATA:  Weakness status post fall EXAM: PORTABLE CHEST 1 VIEW COMPARISON:  Chest radiograph dated 04/03/2021 FINDINGS: Lines/tubes: Left chest wall ICD leads project over the right atrium and ventricle and tributary of the coronary sinus. Lungs: Low lung volumes with bronchovascular crowding. No focal consolidation. Pleura: No pneumothorax or pleural effusion. Heart/mediastinum: The heart size and mediastinal contours are within normal limits. Bones: No radiographic finding of acute displaced fracture. IMPRESSION: 1. Low lung volumes with bronchovascular crowding. No focal consolidation. 2.  No radiographic finding of acute displaced fracture. Electronically Signed   By: Agustin Cree M.D.   On: 08/25/2023 10:29   CT ANGIO HEAD NECK W WO CM W PERF (CODE  STROKE)  Result Date: 08/25/2023 CLINICAL DATA:  Neuro deficit, acute, stroke suspected EXAM: CT ANGIOGRAPHY HEAD AND NECK CT PERFUSION BRAIN TECHNIQUE: Multidetector CT imaging of the head and neck was performed using the standard protocol during bolus administration of intravenous contrast. Multiplanar CT image reconstructions and MIPs were obtained to evaluate the vascular anatomy. Carotid stenosis measurements (when applicable) are obtained utilizing NASCET criteria, using the distal internal carotid diameter as the denominator. Multiphase CT imaging of the brain was performed following IV bolus contrast injection. Subsequent parametric perfusion maps were calculated using RAPID software. RADIATION DOSE REDUCTION: This exam was performed according to the departmental dose-optimization program which includes automated exposure control, adjustment of the mA and/or kV according to patient size and/or use of iterative reconstruction technique. CONTRAST:  OMNIPAQUE IOHEXOL 350 MG/ML SOLN COMPARISON:  Same day CT head. FINDINGS: CTA NECK FINDINGS Aortic arch: Moderate stenosis of the right subclavian artery origin. Intraluminal filling defect within the proximal subclavian artery which is suspicious for thrombus. Ulcerated atherosclerosis of the left subclavian artery origin with mild narrowing. Right carotid system: Atherosclerosis at the carotid bifurcation with approximately 30% stenosis of the ICA origin. Left carotid system: Atherosclerosis at the carotid bifurcation involving the proximal ICA with proximally 50% stenosis of the proximal ICA. Vertebral arteries: Severe bilateral vertebral artery origin stenosis. Both vertebral arteries are patent. Left dominant. Skeleton: No acute abnormality on limited assessment. Multilevel degenerative change in the cervical spine. Other neck: Subcentimeter thyroid nodules which do not require imaging follow-up (ref: J Am Coll Radiol. 2015 Feb;12(2): 143-50). Upper  chest: Visualized lung apices are clear. Review of the MIP images confirms the above findings CTA HEAD FINDINGS Anterior circulation: Bilateral intracranial ICAs are patent with mild to moderate narrowing bilaterally. 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 Posterior circulation: Bilateral intradural vertebral arteries, basilar artery and bilateral posterior cerebral arteries are are patent. Moderate right proximal P2 PCA stenosis. Venous sinuses: As permitted by contrast timing, patent. Review of the MIP images confirms the above findings CT Brain Perfusion Findings: ASPECTS: 10. CBF (<30%) Volume: 0mL Perfusion (Tmax>6.0s) volume: 0mL Mismatch Volume: 0mL Infarction Location:None identified. IMPRESSION: CTA head: 1. No emergent large vessel occlusion. 2. Moderate right proximal P2 PCA stenosis. 3. 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 CTA neck: 1. Approximately 50% stenosis of the proximal ICAs bilaterally in the neck. 2. Severe bilateral vertebral artery origin stenosis. 3. Moderate atherosclerotic narrowing of the right subclavian artery origin. Intraluminal filling defect within the  subclavian artery in this region is suspicious for nonocclusive thrombus versus atherosclerotic plaque extending into the lumen. Right upper extremity ultrasound may be useful to further evaluate and also exclude more distal clot in the arm. 4. Aortic Atherosclerosis (ICD10-I70.0). CT perfusion: No evidence of core infarct or penumbra. Electronically Signed   By: Feliberto Harts M.D.   On: 08/25/2023 09:39   CT HEAD CODE STROKE WO CONTRAST  Result Date: 08/25/2023 CLINICAL DATA:  Code stroke. Neuro deficit with acute stroke suspected facial droop with left arm and leg weakness. EXAM: CT HEAD WITHOUT CONTRAST TECHNIQUE: Contiguous axial images were obtained from the base of the skull through the vertex without intravenous contrast. RADIATION  DOSE REDUCTION: This exam was performed according to the departmental dose-optimization program which includes automated exposure control, adjustment of the mA and/or kV according to patient size and/or use of iterative reconstruction technique. COMPARISON:  None Available. FINDINGS: Brain: No evidence of acute infarction, hemorrhage, hydrocephalus, extra-axial collection or mass lesion/mass effect. Extensive chronic small vessel ischemia with gliosis and lacunar infarcts in the deep gray nuclei. None of these are clearly acute. Small chronic infarct in the left cerebellum. Vascular: Limited at the proximal MCA due to streak artifact. No convincing hyperdensity. Skull: No acute or aggressive finding. Lucency at the superior left frontal bone is likely a incidental hemangioma. Sinuses/Orbits: No acute finding. Other: These results were communicated to Dr. Wilford Corner at 8:44 am on 08/25/2023 by epic chat. ASPECTS [facility] Stroke Program Early CT Score) - Ganglionic level infarction (caudate, lentiform nuclei, internal capsule, insula, M1-M3 cortex): 7 - Supraganglionic infarction (M4-M6 cortex): 3 Total score (0-10 with 10 being normal): 10 IMPRESSION: 1. No acute finding. 2. Extensive chronic small vessel ischemia. Electronically Signed   By: Tiburcio Pea M.D.   On: 08/25/2023 08:46    Pending Labs Unresulted Labs (From admission, onward)     Start     Ordered   09/02/2023 0500  Creatinine, serum  (enoxaparin (LOVENOX)    CrCl >/= 30 ml/min)  Weekly,   R     Comments: while on enoxaparin therapy    08/25/23 1315   08/26/23 0500  Lipid panel  (Labs)  Tomorrow morning,   R       Comments: Fasting    08/25/23 1315   08/25/23 1314  Hemoglobin A1c  (Labs)  Once,   R       Comments: To assess prior glycemic control    08/25/23 1315   08/25/23 1314  CBC  (enoxaparin (LOVENOX)    CrCl >/= 30 ml/min)  Once,   R       Comments: Baseline for enoxaparin therapy IF NOT ALREADY DRAWN.  Notify MD if PLT < 100 K.     08/25/23 1315   08/25/23 1314  Creatinine, serum  (enoxaparin (LOVENOX)    CrCl >/= 30 ml/min)  Once,   R       Comments: Baseline for enoxaparin therapy IF NOT ALREADY DRAWN.    08/25/23 1315   08/25/23 1240  Urine Culture  Add-on,   AD       Question:  Indication  Answer:  Altered mental status (if no other cause identified)   08/25/23 1239   08/25/23 1238  CK  Once,   URGENT        08/25/23 1237            Vitals/Pain Today's Vitals   08/25/23 1115 08/25/23 1130 08/25/23 1142 08/25/23 1230  BP: (!) 159/112 Marland Kitchen)  148/135  (!) 181/92  Pulse: 98     Resp: 17 16  20   Temp:      TempSrc:      SpO2: 100%     Weight:      PainSc:   0-No pain     Isolation Precautions No active isolations  Medications Medications  0.9 %  sodium chloride infusion (has no administration in time range)   stroke: early stages of recovery book (has no administration in time range)  acetaminophen (TYLENOL) tablet 650 mg (has no administration in time range)    Or  acetaminophen (TYLENOL) 160 MG/5ML solution 650 mg (has no administration in time range)    Or  acetaminophen (TYLENOL) suppository 650 mg (has no administration in time range)  senna-docusate (Senokot-S) tablet 1 tablet (has no administration in time range)  enoxaparin (LOVENOX) injection 40 mg (has no administration in time range)  aspirin suppository 300 mg (has no administration in time range)    Or  aspirin tablet 325 mg (has no administration in time range)  atorvastatin (LIPITOR) tablet 40 mg (has no administration in time range)  iohexol (OMNIPAQUE) 350 MG/ML injection 100 mL (100 mLs Intravenous Contrast Given 08/25/23 0910)    Mobility walks     Focused Assessments Neuro Assessment Handoff:  Swallow screen pass? No  Cardiac Rhythm: Atrial paced NIH Stroke Scale  Dizziness Present: No Headache Present: No Interval: Shift assessment Level of Consciousness (1a.)   : Alert, keenly responsive LOC Questions (1b. )   :  Answers both questions correctly LOC Commands (1c. )   : Performs both tasks correctly Best Gaze (2. )  : Partial gaze palsy Visual (3. )  : No visual loss Facial Palsy (4. )    : Minor paralysis Motor Arm, Left (5a. )   : Drift Motor Arm, Right (5b. ) : No drift Motor Leg, Left (6a. )  : Some effort against gravity Motor Leg, Right (6b. ) : Drift Limb Ataxia (7. ): Absent Sensory (8. )  : Normal, no sensory loss Best Language (9. )  : No aphasia Dysarthria (10. ): Mild-to-moderate dysarthria, patient slurs at least some words and, at worst, can be understood with some difficulty Extinction/Inattention (11.)   : No Abnormality Complete NIHSS TOTAL: 7 Last date known well: 08/24/23 Last time known well: 2200 Neuro Assessment: Exceptions to WDL Neuro Checks:   Initial (08/25/23 0830)  Has TPA been given? No If patient is a Neuro Trauma and patient is going to OR before floor call report to 4N Charge nurse: [phone] or [phone]   R Recommendations: See Admitting Provider Note  Report given to:   Additional Notes: Pt has q2 NIH assessments one is due at 1330 that we will do before we bring her up. Pt has family at bedside. Pt failed swallow screen. Speech consult in. MRI completed.

## 2023-08-25 NOTE — Progress Notes (Signed)
Had one episode of vomiting and feeling nauseated, gave PRN Zofran, stated felt better after that, will continue to monitor

## 2023-08-25 NOTE — ED Provider Notes (Signed)
[place] EMERGENCY DEPARTMENT AT [facility] Provider Note   CSN: [id number] Arrival date & time: 08/25/23  4098  An emergency department physician performed an initial assessment on this suspected stroke patient at 0822.  History  Chief Complaint  Patient presents with   Code Stroke    Nicole Chan is a 82 y.o. female.  Patient seen by me he as well as code stroke team upon arrival with the bridge.  Patient brought in by EMS.  Past medical history significant for arthritis chronic systolic congestive heart failure dilated cardiomyopathy.  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  Left bundle branch block.  History of tobacco abuse.  Patient last known well somewhere around 10 PM.  Patient was found on the ground and noted to have left-sided weakness.  I think the fall was somewhere around 1 AM because she tried to call her daughter but daughter did not see the phone message.  Took 2 people to get her up then noticed that she had slurred speech they called the EMS for the slurred speech left facial droop left-sided weakness and activated code stroke for LVO positive stroke because of their fast ED screening score for.  She was evaluated emergently not a candidate for IV TNK because outside the window.  Patient did have head CT and CT angio patient needs MRI that has been ordered by the stroke team.  Because of the pacemaker they need clearance.  They recommend permissive hypertension as high as 220 and medicine admission.       Home Medications Prior to Admission medications   Medication Sig Start Date End Date Taking? Authorizing Provider  atorvastatin (LIPITOR) 20 MG tablet Take 20 mg by mouth daily.   Yes [provider]  carvedilol (COREG) 12.5 MG tablet Take 1 tablet (12.5 mg total) by mouth 2 (two) times daily with a meal. 07/19/23  Yes Marinus Maw, MD  irbesartan (AVAPRO) 300 MG tablet  Take 1 tablet (300 mg total) by mouth daily. 04/03/21  Yes Jacalyn Lefevre, MD  Vitamin D, Ergocalciferol, (DRISDOL) 50000 UNITS CAPS capsule Take 50,000 Units by mouth 2 (two) times a week.    Yes [provider]      Allergies    Shellfish allergy    Review of Systems   Review of Systems  Unable to perform ROS: Patient nonverbal    Physical Exam Updated Vital Signs BP (!) 148/135   Pulse 98   Temp 98.3 F (36.8 C) (Oral)   Resp 16   Wt 89.6 kg   SpO2 100%   BMI 30.94 kg/m  Physical Exam Vitals and nursing note reviewed.  Constitutional:      General: She is not in acute distress.    Appearance: Normal appearance. She is well-developed.  HENT:     Head: Normocephalic and atraumatic.  Eyes:     Extraocular Movements: Extraocular movements intact.     Conjunctiva/sclera: Conjunctivae normal.     Pupils: Pupils are equal, round, and reactive to light.  Cardiovascular:     Rate and Rhythm: Normal rate and regular rhythm.     Heart sounds: No murmur heard. Pulmonary:     Effort: Pulmonary effort is normal. No respiratory distress.     Breath sounds: Normal breath sounds.  Abdominal:     Palpations: Abdomen is soft.     Tenderness: There is no abdominal tenderness.  Musculoskeletal:        General:  No swelling.     Cervical back: Neck supple.  Skin:    General: Skin is warm and dry.     Capillary Refill: Capillary refill takes less than 2 seconds.  Neurological:     Mental Status: She is alert.     Cranial Nerves: Cranial nerve deficit present.     Comments: Left facial droop.  Weakness to the left lower extremity with some drift.  Little bit of weakness in the right lower extremity but left lower extremity is weaker than right.  No drift in the upper extremities.  Sensation seems to be intact.  Psychiatric:        Mood and Affect: Mood normal.     ED Results / Procedures / Treatments   Labs (all labs ordered are listed, but only abnormal results are  displayed) Labs Reviewed  CBC - Abnormal; Notable for the following components:      Result Value   WBC 13.9 (*)    All other components within normal limits  DIFFERENTIAL - Abnormal; Notable for the following components:   Neutro Abs 11.6 (*)    All other components within normal limits  COMPREHENSIVE METABOLIC PANEL - Abnormal; Notable for the following components:   CO2 19 (*)    Glucose, Bld 169 (*)    Calcium 10.4 (*)    Total Bilirubin 1.5 (*)    GFR, Estimated 58 (*)    All other components within normal limits  URINALYSIS, ROUTINE W REFLEX MICROSCOPIC - Abnormal; Notable for the following components:   Hgb urine dipstick SMALL (*)    Protein, ur 30 (*)    Bacteria, UA RARE (*)    All other components within normal limits  I-STAT CHEM 8, ED - Abnormal; Notable for the following components:   Glucose, Bld 170 (*)    Hemoglobin 15.6 (*)    All other components within normal limits  CBG MONITORING, ED - Abnormal; Notable for the following components:   Glucose-Capillary 159 (*)    All other components within normal limits  URINE CULTURE  ETHANOL  PROTIME-INR  APTT  RAPID URINE DRUG SCREEN, HOSP PERFORMED  CK    EKG EKG Interpretation Date/Time:  Wednesday August 25 2023 09:20:20 EST Ventricular Rate:  109 PR Interval:  104 QRS Duration:  143 QT Interval:  368 QTC Calculation: 496 R Axis:   0  Text Interpretation: Atrial-sensed ventricular-paced rhythm No further analysis attempted due to paced rhythm Artifact in lead(s) I Confirmed by Vanetta Mulders [phone]) on 08/25/2023 9:45:17 AM  Radiology DG Chest Port 1 View  Result Date: 08/25/2023 CLINICAL DATA:  Weakness status post fall EXAM: PORTABLE CHEST 1 VIEW COMPARISON:  Chest radiograph dated 04/03/2021 FINDINGS: Lines/tubes: Left chest wall ICD leads project over the right atrium and ventricle and tributary of the coronary sinus. Lungs: Low lung volumes with bronchovascular crowding. No focal consolidation.  Pleura: No pneumothorax or pleural effusion. Heart/mediastinum: The heart size and mediastinal contours are within normal limits. Bones: No radiographic finding of acute displaced fracture. IMPRESSION: 1. Low lung volumes with bronchovascular crowding. No focal consolidation. 2.  No radiographic finding of acute displaced fracture. Electronically Signed   By: Agustin Cree M.D.   On: 08/25/2023 10:29   CT ANGIO HEAD NECK W WO CM W PERF (CODE STROKE)  Result Date: 08/25/2023 CLINICAL DATA:  Neuro deficit, acute, stroke suspected EXAM: CT ANGIOGRAPHY HEAD AND NECK CT PERFUSION BRAIN TECHNIQUE: Multidetector CT imaging of the head and neck was performed using  the standard protocol during bolus administration of intravenous contrast. Multiplanar CT image reconstructions and MIPs were obtained to evaluate the vascular anatomy. Carotid stenosis measurements (when applicable) are obtained utilizing NASCET criteria, using the distal internal carotid diameter as the denominator. Multiphase CT imaging of the brain was performed following IV bolus contrast injection. Subsequent parametric perfusion maps were calculated using RAPID software. RADIATION DOSE REDUCTION: This exam was performed according to the departmental dose-optimization program which includes automated exposure control, adjustment of the mA and/or kV according to patient size and/or use of iterative reconstruction technique. CONTRAST:  OMNIPAQUE IOHEXOL 350 MG/ML SOLN COMPARISON:  Same day CT head. FINDINGS: CTA NECK FINDINGS Aortic arch: Moderate stenosis of the right subclavian artery origin. Intraluminal filling defect within the proximal subclavian artery which is suspicious for thrombus. Ulcerated atherosclerosis of the left subclavian artery origin with mild narrowing. Right carotid system: Atherosclerosis at the carotid bifurcation with approximately 30% stenosis of the ICA origin. Left carotid system: Atherosclerosis at the carotid bifurcation  involving the proximal ICA with proximally 50% stenosis of the proximal ICA. Vertebral arteries: Severe bilateral vertebral artery origin stenosis. Both vertebral arteries are patent. Left dominant. Skeleton: No acute abnormality on limited assessment. Multilevel degenerative change in the cervical spine. Other neck: Subcentimeter thyroid nodules which do not require imaging follow-up (ref: J Am Coll Radiol. 2015 Feb;12(2): 143-50). Upper chest: Visualized lung apices are clear. Review of the MIP images confirms the above findings CTA HEAD FINDINGS Anterior circulation: Bilateral intracranial ICAs are patent with mild to moderate narrowing bilaterally. 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 Posterior circulation: Bilateral intradural vertebral arteries, basilar artery and bilateral posterior cerebral arteries are are patent. Moderate right proximal P2 PCA stenosis. Venous sinuses: As permitted by contrast timing, patent. Review of the MIP images confirms the above findings CT Brain Perfusion Findings: ASPECTS: 10. CBF (<30%) Volume: 0mL Perfusion (Tmax>6.0s) volume: 0mL Mismatch Volume: 0mL Infarction Location:None identified. IMPRESSION: CTA head: 1. No emergent large vessel occlusion. 2. Moderate right proximal P2 PCA stenosis. 3. 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 CTA neck: 1. Approximately 50% stenosis of the proximal ICAs bilaterally in the neck. 2. Severe bilateral vertebral artery origin stenosis. 3. Moderate atherosclerotic narrowing of the right subclavian artery origin. Intraluminal filling defect within the subclavian artery in this region is suspicious for nonocclusive thrombus versus atherosclerotic plaque extending into the lumen. Right upper extremity ultrasound may be useful to further evaluate and also exclude more distal clot in the arm. 4. Aortic Atherosclerosis (ICD10-I70.0). CT perfusion: No evidence of core  infarct or penumbra. Electronically Signed   By: Feliberto Harts M.D.   On: 08/25/2023 09:39   CT HEAD CODE STROKE WO CONTRAST  Result Date: 08/25/2023 CLINICAL DATA:  Code stroke. Neuro deficit with acute stroke suspected facial droop with left arm and leg weakness. EXAM: CT HEAD WITHOUT CONTRAST TECHNIQUE: Contiguous axial images were obtained from the base of the skull through the vertex without intravenous contrast. RADIATION DOSE REDUCTION: This exam was performed according to the departmental dose-optimization program which includes automated exposure control, adjustment of the mA and/or kV according to patient size and/or use of iterative reconstruction technique. COMPARISON:  None Available. FINDINGS: Brain: No evidence of acute infarction, hemorrhage, hydrocephalus, extra-axial collection or mass lesion/mass effect. Extensive chronic small vessel ischemia with gliosis and lacunar infarcts in the deep gray nuclei. None of these are clearly acute. Small chronic infarct in  the left cerebellum. Vascular: Limited at the proximal MCA due to streak artifact. No convincing hyperdensity. Skull: No acute or aggressive finding. Lucency at the superior left frontal bone is likely a incidental hemangioma. Sinuses/Orbits: No acute finding. Other: These results were communicated to Dr. Wilford Corner at 8:44 am on 08/25/2023 by epic chat. ASPECTS [facility] Stroke Program Early CT Score) - Ganglionic level infarction (caudate, lentiform nuclei, internal capsule, insula, M1-M3 cortex): 7 - Supraganglionic infarction (M4-M6 cortex): 3 Total score (0-10 with 10 being normal): 10 IMPRESSION: 1. No acute finding. 2. Extensive chronic small vessel ischemia. Electronically Signed   By: Tiburcio Pea M.D.   On: 08/25/2023 08:46    Procedures Procedures    Medications Ordered in ED Medications  0.9 %  sodium chloride infusion (has no administration in time range)  iohexol (OMNIPAQUE) 350 MG/ML injection 100 mL (100 mLs  Intravenous Contrast Given 08/25/23 0910)    ED Course/ Medical Decision Making/ A&P                                 Medical Decision Making Amount and/or Complexity of Data Reviewed Labs: ordered. Radiology: ordered.  Risk Prescription drug management. Decision regarding hospitalization.  CRITICAL CARE Performed by: Vanetta Mulders Total critical care time: 60 minutes Critical care time was exclusive of separately billable procedures and treating other patients. Critical care was necessary to treat or prevent imminent or life-threatening deterioration. Critical care was time spent personally by me on the following activities: development of treatment plan with patient and/or surrogate as well as nursing, discussions with consultants, evaluation of patient's response to treatment, examination of patient, obtaining history from patient or surrogate, ordering and performing treatments and interventions, ordering and review of laboratory studies, ordering and review of radiographic studies, pulse oximetry and re-evaluation of patient's condition.  Code stroke team neurologist Dr. Jerrell Belfast feels likely small vessel etiology of infarct versus cardioembolic stroke.  Labs urinalysis with some RBCs but not classic for urinary tract infection urine sent for culture.  CK ordered.  Drug screen negative.  I-STAT without any significant electrolyte abnormalities hemoglobin 15.6.  CBC white count 13.9 hemoglobin 14.9.  Complete metabolic panel total bili 1.5 renal function normal.  Portable chest x-ray no acute findings.  Some bright bronchovascular crowding no focal consolidation.  CT angio head and neck and perfusion no evidence of cord infarcts.  And regular CT had no acute findings extensive chronic vessel correction small vessel ischemia.  Final Clinical Impression(s) / ED Diagnoses Final diagnoses:  Cerebrovascular accident (CVA), unspecified mechanism (HCC)    Rx / DC Orders ED Discharge  Orders     None         Vanetta Mulders, MD 08/25/23 1243

## 2023-08-25 NOTE — Evaluation (Signed)
Clinical/Bedside Swallow Evaluation Patient Details  Name: Nicole Chan MRN: [id number] Date of Birth: Dec 14, 1940  Today's Date: 08/25/2023 Time: SLP Start Time (ACUTE ONLY): 1410 SLP Stop Time (ACUTE ONLY): 1427 SLP Time Calculation (min) (ACUTE ONLY): 17 min  Past Medical History:  Past Medical History:  Diagnosis Date   Arthritis    "maybe in my hands/fingers; not terrible" (07/12/2015)   Chronic systolic CHF (congestive heart failure) (HCC)    Dilated cardiomyopathy (HCC)    Episode of dizziness, secondary to cardiomyopathy possibly  10/25/2014   Essential hypertension    Hypertensive heart disease    LBBB (left bundle branch block)    a. noted on 10/24/14 admission for dizziness    NICM (nonischemic cardiomyopathy) (HCC)    a. Cath 10/29/14: normal coronary arteries (tortuous, compatible with hypertension). b. s/p [employer] BiV-ICD 06/2015.   Tobacco abuse    Past Surgical History:  Past Surgical History:  Procedure Laterality Date   ABDOMINAL HYSTERECTOMY  1970's   APPENDECTOMY     BI-VENTRICULAR IMPLANTABLE CARDIOVERTER DEFIBRILLATOR  (CRT-D)  07/11/2015   BIV ICD GENERATOR CHANGEOUT N/A 03/15/2020   Procedure: BIV ICD GENERATOR CHANGEOUT;  Surgeon: Marinus Maw, MD;  Location: [facility] INVASIVE CV LAB;  Service: Cardiovascular;  Laterality: N/A;   CARDIAC CATHETERIZATION     EP IMPLANTABLE DEVICE N/A 07/11/2015   Procedure: BiV ICD Insertion CRT-D;  Surgeon: Marinus Maw, MD;  Location: [facility] INVASIVE CV LAB;  Service: Cardiovascular;  Laterality: N/A;   LEFT HEART CATHETERIZATION WITH CORONARY ANGIOGRAM N/A 10/29/2014   Procedure: LEFT HEART CATHETERIZATION WITH CORONARY ANGIOGRAM;  Surgeon: Lesleigh Noe, MD;  Location: [facility] CATH LAB;  Service: Cardiovascular;  Laterality: N/A;   TONSILLECTOMY     TUBAL LIGATION  1970's   HPI:  Nicole Chan is an 82 yr old female who presented to [facility] via EMS on 08/25/2023. She was found down by family. EMS activated code stroke alert  for left sided weakness and questionable rt gaze preference. 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 Small acute infarcts within the right insula and right subinsular  white matter.   PMH of CHF, Cardiomyopathy, HTN, pacemaker.    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  She was able to drink thin liquids from a spoon and straw with no s/s of aspiration during single then sequential swallows. Recommend starting a dysphagia 1 diet, thin liquids; meds whole in puree.  Pt will require full supervision, assist with feeding. If she coughs with PO intake, please reach out to SLP via Secure Chat ([facility] SLP).  SLP will follow for swallowing and speech assessment. Discussed results/recs with her daughter, Misty Stanley, and son, Marita [place], at the bedside. SLP Visit Diagnosis: Dysphagia, oropharyngeal phase (R13.12)    Aspiration Risk       Diet Recommendation   Thin;Dysphagia 1 (puree)  Medication Administration: Whole meds with puree    Other  Recommendations Oral Care Recommendations: Oral care BID    Recommendations for follow up therapy are one component of a multi-disciplinary discharge planning process, led by the attending physician.  Recommendations may be updated based on patient status, additional functional criteria and insurance authorization.  Follow up Recommendations Other (comment) (tba)      Assistance Recommended at Discharge  tba  Functional Status Assessment Patient has had a recent decline in their functional status and demonstrates the ability to make significant improvements in function in a reasonable and predictable amount of time.  Frequency and Duration min 2x/week  2 weeks       Prognosis Prognosis for improved oropharyngeal function: Good       Swallow Study   General HPI: Nicole Chan is an 82 yr old female who presented to [facility] via EMS on 08/25/2023. She was found down by family. EMS activated code stroke alert for left sided weakness and questionable rt gaze preference. 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 Small acute infarcts within the right insula and right subinsular  white matter.   PMH of CHF, Cardiomyopathy, HTN, pacemaker. Type of Study: Bedside Swallow Evaluation Previous Swallow Assessment: no Diet Prior to this Study: NPO Temperature Spikes Noted: No Respiratory Status: Room air History of Recent Intubation: No Behavior/Cognition: Lethargic/Drowsy Oral Cavity Assessment: Within Functional Limits Oral Care Completed by SLP: No Oral Cavity - Dentition: Missing dentition Self-Feeding Abilities: Needs assist Patient Positioning: Upright in bed Baseline Vocal Quality: Low vocal intensity Volitional Cough: Weak Volitional Swallow: Able to elicit    Oral/Motor/Sensory Function Overall Oral Motor/Sensory Function: Moderate impairment Facial ROM: Suspected CN VII (facial) dysfunction;Reduced left Facial Symmetry: Suspected CN VII (facial) dysfunction;Abnormal symmetry left Facial Sensation: Suspected CN V (Trigeminal) dysfunction;Reduced left Lingual Symmetry: Within Functional Limits   Ice Chips Ice chips: Impaired Presentation: Spoon Oral Phase Impairments: Reduced labial seal Oral Phase Functional Implications: Left anterior spillage;Prolonged oral transit Pharyngeal Phase Impairments: Suspected delayed Swallow   Thin Liquid Thin Liquid: Impaired Presentation: Cup;Spoon;Straw Oral Phase Impairments: Reduced labial seal Oral Phase Functional Implications: Left anterior spillage Pharyngeal  Phase Impairments: Suspected delayed Swallow    Nectar Thick Nectar Thick Liquid: Not tested   Honey Thick Honey Thick Liquid: Not tested   Puree Puree: Impaired Presentation:  Spoon Oral Phase Impairments: Reduced labial seal Oral Phase Functional Implications: Left anterior spillage;Prolonged oral transit   Solid     Solid: Not tested      [name] Nicole Chan 08/25/2023,2:45 PM  Marchelle Folks L. Samson Frederic, MA CCC/SLP Clinical Specialist - Acute Care SLP Acute Rehabilitation Services Office number [phone]

## 2023-08-25 NOTE — Code Documentation (Signed)
Nicole Chan is an 82 yr old female with a PMH of CHF, Cardiomyopathy, and HTN arriving to [facility] via EMS on 08/25/2023. Pt is coming from home where she was last known well last night (11/26) at 2200. This morning, family member found her down. EMS activated code stroke alert for left sided weakness and questionable rt gaze preference.Pt has ICD. Pt is on no thinners.    Pt met at bridge by stroke team. CBG, Labs obtained. Airway cleared by EDP. Pt to CT with team. NIHSS 8. Pt with Lt sided deficits and dysarthria. Equivocal rt gaze preference. (Please see documentation for NIHSS details and timeline). The following imaging was obtained: CT, CTA/P. Per Dr. Wilford Corner, CT is negative for hemorrhage. CTA negative for LVO or perfusion deficit.     Pt back to ED room 17 where her workup will continue. She will need q 2 hr VS and NIHSS. Pt ineligible for TNK as LKW is outside of treatment window. Pt not candidate for EVT as LVO negative. Bedside handoff with ED RN complete.

## 2023-08-25 NOTE — ED Notes (Signed)
Family at bedside. 

## 2023-08-25 NOTE — Progress Notes (Signed)
  Echocardiogram 2D Echocardiogram has been performed.  Nicole Chan 08/25/2023, 2:41 PM

## 2023-08-26 ENCOUNTER — Encounter (HOSPITAL_COMMUNITY): Payer: Medicare Other

## 2023-08-26 ENCOUNTER — Inpatient Hospital Stay (HOSPITAL_COMMUNITY): Payer: Medicare Other

## 2023-08-26 DIAGNOSIS — G9389 Other specified disorders of brain: Secondary | ICD-10-CM | POA: Diagnosis not present

## 2023-08-26 DIAGNOSIS — I639 Cerebral infarction, unspecified: Secondary | ICD-10-CM | POA: Diagnosis not present

## 2023-08-26 DIAGNOSIS — I63231 Cerebral infarction due to unspecified occlusion or stenosis of right carotid arteries: Secondary | ICD-10-CM

## 2023-08-26 DIAGNOSIS — I6523 Occlusion and stenosis of bilateral carotid arteries: Secondary | ICD-10-CM | POA: Diagnosis not present

## 2023-08-26 DIAGNOSIS — I5022 Chronic systolic (congestive) heart failure: Secondary | ICD-10-CM | POA: Diagnosis not present

## 2023-08-26 LAB — GLUCOSE, CAPILLARY: Glucose-Capillary: 140 mg/dL — ABNORMAL HIGH (ref 70–99)

## 2023-08-26 LAB — LIPID PANEL
Cholesterol: 203 mg/dL — ABNORMAL HIGH (ref 0–200)
HDL: 35 mg/dL — ABNORMAL LOW (ref >40)
LDL Cholesterol: 141 mg/dL — ABNORMAL HIGH (ref 0–99)
Total CHOL/HDL Ratio: 5.8 {ratio}
Triglycerides: 136 mg/dL (ref <150)
VLDL: 27 mg/dL (ref 0–40)

## 2023-08-26 LAB — PHOSPHORUS: Phosphorus: 3.1 mg/dL (ref 2.5–4.6)

## 2023-08-26 LAB — VITAMIN D 25 HYDROXY (VIT D DEFICIENCY, FRACTURES): Vit D, 25-Hydroxy: 21.71 ng/mL — ABNORMAL LOW (ref 30–100)

## 2023-08-26 MED ORDER — ASPIRIN 81 MG PO TBEC
81.0000 mg | DELAYED_RELEASE_TABLET | Freq: Every day | ORAL | Status: DC
  Administered 2023-08-27 – 2023-08-30 (×4): 81 mg via ORAL
  Filled 2023-08-26 (×5): qty 1

## 2023-08-26 MED ORDER — ATORVASTATIN CALCIUM 80 MG PO TABS
80.0000 mg | ORAL_TABLET | Freq: Every day | ORAL | Status: DC
  Administered 2023-08-26 – 2023-08-30 (×5): 80 mg via ORAL
  Filled 2023-08-26 (×7): qty 1

## 2023-08-26 NOTE — Consult Note (Addendum)
Hospital Consult    Reason for Consult:  acute CVA Requesting Physician:  Marinda Elk MD MRN #:  [id number]  History of Present Illness: Nicole Chan is a 82 y.o. female with a PMH of chronic systolic CHF, dilated cardiomyopathy, HTN, LBBB, and hypertensive heart disease who was admitted to [facility] yesterday with a stroke.  The patient was found on the floor beside her bed around 7:30 AM yesterday.  Last known normal time was 10 PM the night before.  She was found to have dysarthria and left arm weakness.  CT angio head/neck demonstrates bilateral ICA stenosis of 50%. MRI brain demonstrates small acute infarcts within the right insula and right subinsular white matter.  She also has several chronic infarcts.  She is joined with family at the bedside today. She still feels like her left side is weak. She denies experiencing any stroke symptoms in the past prior to this admission.  Past Medical History:  Diagnosis Date   Arthritis    "maybe in my hands/fingers; not terrible" (07/12/2015)   Chronic systolic CHF (congestive heart failure) (HCC)    Dilated cardiomyopathy (HCC)    Episode of dizziness, secondary to cardiomyopathy possibly  10/25/2014   Essential hypertension    Hypertensive heart disease    LBBB (left bundle branch block)    a. noted on 10/24/14 admission for dizziness    NICM (nonischemic cardiomyopathy) (HCC)    a. Cath 10/29/14: normal coronary arteries (tortuous, compatible with hypertension). b. s/p [employer] BiV-ICD 06/2015.   Tobacco abuse     Past Surgical History:  Procedure Laterality Date   ABDOMINAL HYSTERECTOMY  1970's   APPENDECTOMY     BI-VENTRICULAR IMPLANTABLE CARDIOVERTER DEFIBRILLATOR  (CRT-D)  07/11/2015   BIV ICD GENERATOR CHANGEOUT N/A 03/15/2020   Procedure: BIV ICD GENERATOR CHANGEOUT;  Surgeon: Marinus Maw, MD;  Location: [facility] INVASIVE CV LAB;  Service: Cardiovascular;  Laterality: N/A;   CARDIAC CATHETERIZATION     EP  IMPLANTABLE DEVICE N/A 07/11/2015   Procedure: BiV ICD Insertion CRT-D;  Surgeon: Marinus Maw, MD;  Location: [facility] INVASIVE CV LAB;  Service: Cardiovascular;  Laterality: N/A;   LEFT HEART CATHETERIZATION WITH CORONARY ANGIOGRAM N/A 10/29/2014   Procedure: LEFT HEART CATHETERIZATION WITH CORONARY ANGIOGRAM;  Surgeon: Lesleigh Noe, MD;  Location: [facility] CATH LAB;  Service: Cardiovascular;  Laterality: N/A;   TONSILLECTOMY     TUBAL LIGATION  1970's    Allergies  Allergen Reactions   Shellfish Allergy Itching    Prior to Admission medications   Medication Sig Start Date End Date Taking? Authorizing Provider  atorvastatin (LIPITOR) 20 MG tablet Take 20 mg by mouth daily.   Yes [provider]  carvedilol (COREG) 12.5 MG tablet Take 1 tablet (12.5 mg total) by mouth 2 (two) times daily with a meal. 07/19/23  Yes Marinus Maw, MD  irbesartan (AVAPRO) 300 MG tablet Take 1 tablet (300 mg total) by mouth daily. 04/03/21  Yes Jacalyn Lefevre, MD  Vitamin D, Ergocalciferol, (DRISDOL) 50000 UNITS CAPS capsule Take 50,000 Units by mouth 2 (two) times a week.    Yes [provider]    Social History   Socioeconomic History   Marital status: Widowed    Spouse name: Not on file   Number of children: 4   Years of education: Not on file   Highest education level: Not on file  Occupational History   Not on file  Tobacco Use   Smoking status: Every Day  Current packs/day: 0.50    Average packs/day: 0.5 packs/day for 55.0 years (27.5 ttl pk-yrs)    Types: Cigarettes   Smokeless tobacco: Never  Substance and Sexual Activity   Alcohol use: No    Alcohol/week: 0.0 standard drinks of alcohol    Comment: on special occasions   Drug use: No   Sexual activity: Yes  Other Topics Concern   Not on file  Social History Narrative   Not on file   Social Determinants of Health   Financial Resource Strain: Not on file  Food Insecurity: No Food Insecurity (08/25/2023)   Hunger  Vital Sign    Worried About Running Out of Food in the Last Year: Never true    Ran Out of Food in the Last Year: Never true  Transportation Needs: No Transportation Needs (08/25/2023)   PRAPARE - Administrator, Civil Service (Medical): No    Lack of Transportation (Non-Medical): No  Physical Activity: Not on file  Stress: Not on file  Social Connections: Unknown (02/10/2022)   Received from [facility]   Social Network    Social Network: Not on file  Intimate Partner Violence: Not At Risk (08/25/2023)   Humiliation, Afraid, Rape, and Kick questionnaire    Fear of Current or Ex-Partner: No    Emotionally Abused: No    Physically Abused: No    Sexually Abused: No     Family History  Problem Relation Age of Onset   Arrhythmia Mother    Hypertension Mother    Diabetes Mother    Heart disease Mother    Cancer Father    Lung cancer Sister    Healthy Paternal Aunt    Heart attack Neg Hx    Stroke Neg Hx     ROS: Otherwise negative unless mentioned in HPI  Physical Examination  Vitals:   08/26/23 0756 08/26/23 0758  BP: (!) 193/96 (!) 193/96  Pulse: 88 88  Resp: 16 16  Temp: 98.9 F (37.2 C) 98.9 F (37.2 C)  SpO2: 90% 90%   Body mass index is 30.94 kg/m.  General:  no acute distress, appears stated age Gait: Not observed HENT: WNL, normocephalic Pulmonary: normal non-labored breathing Cardiac: regular Abdomen:  soft, NT/ND, no masses Skin: without rashes Vascular Exam/Pulses: palpable and equal radial pulses, 1+ Extremities: moving all extremities. No right arm weakness. Left arm grip 3/5. BLE weakness, L>R Musculoskeletal: no muscle wasting or atrophy  Neurologic: A&O X 3;  No focal weakness or paresthesias are detected; speech is fluent/normal Psychiatric:  The pt has Normal affect. Lymph:  Unremarkable  CBC    Component Value Date/Time   WBC 12.5 (H) 08/25/2023 1640   RBC 4.89 08/25/2023 1640   HGB 14.3 08/25/2023 1640    HGB 13.4 03/12/2020 1302   HCT 42.3 08/25/2023 1640   HCT 39.7 03/12/2020 1302   PLT 217 08/25/2023 1640   PLT 240 03/12/2020 1302   MCV 86.5 08/25/2023 1640   MCV 90 03/12/2020 1302   MCH 29.2 08/25/2023 1640   MCHC 33.8 08/25/2023 1640   RDW 12.8 08/25/2023 1640   RDW 13.9 03/12/2020 1302   LYMPHSABS 1.5 08/25/2023 0823   LYMPHSABS 1.6 03/12/2020 1302   MONOABS 0.8 08/25/2023 0823   EOSABS 0.1 08/25/2023 0823   EOSABS 0.2 03/12/2020 1302   BASOSABS 0.0 08/25/2023 0823   BASOSABS 0.0 03/12/2020 1302    BMET    Component Value Date/Time   NA 141 08/25/2023 0830  NA 137 03/12/2020 1302   K 3.7 08/25/2023 0830   CL 108 08/25/2023 0830   CO2 19 (L) 08/25/2023 0823   GLUCOSE 170 (H) 08/25/2023 0830   BUN 17 08/25/2023 0830   BUN 19 03/12/2020 1302   CREATININE 0.81 08/25/2023 1535   CALCIUM 10.4 (H) 08/25/2023 0823   GFRNONAA >60 08/25/2023 1535   GFRAA 66 03/12/2020 1302    COAGS: Lab Results  Component Value Date   INR 1.1 08/25/2023   INR 1.00 10/29/2014     Non-Invasive Vascular Imaging:   Carotid Duplex Ordered   ASSESSMENT/PLAN: This is a 82 y.o. female admitted for acute right CVA   -The patient was admitted to the hospital yesterday morning after being found down at home.  She was found to have dysarthria and left arm weakness  -She is feeling better this morning. Her speech is coherent. She has some left sided arm and leg weakness. Family reports she has had chronic issues with right leg weakness. -MRI brain on admission demonstrates acute infarcts in the insula and basal ganglia. There are also several chronic infarcts with evidence of advanced chronic small vessel disease -CTA head/neck demonstrates around 50% stenosis of bilateral ICA -We will obtain carotid duplex for further workup. It is currently questionable whether her stroke was caused by longstanding chronic ischemic small vessel disease vs right carotid stenosis. Potentially she could be a  right carotid endarterectomy candidate if she requires revascularization. Pending further stroke workup -Dr.Celsa Nordahl will see the patient later this morning and offer further treatment discussion   Loel Dubonnet PA-C [facility] [phone]   I have seen and evaluated the patient. I agree with the PA note as documented above.  82 year old female admitted as a code stroke.  Found down at home with some increased left-sided weakness.  MRI brain showed infarct in the right corona radiata/basal ganglia with multiple chronic small ischemic infarcts.  CTA neck was obtained showing approximately 50% ICA stenosis bilaterally per radiology.  I reviewed her imaging and she certainly has a calcified carotid lesion in the right ICA that looks greater than 50%.  I will allow her stroke workup to be completed and discuss with neurology if they feel this stroke is related to carotid disease versus small vessel disease.  We did order carotid duplex.  If ultimately she pursues carotid revascularization I do not think she is a good candidate for TCAR as she has a low carotid bifurcation with a very short common carotid.  She would need a carotid endarterectomy which I discussed with family.  Very sleepy on my exam but arousable.  Left upper and lower extremity weaker than the right.  Vascular will follow.    Cephus Shelling, MD [facility] [place] Office: [phone]

## 2023-08-26 NOTE — Progress Notes (Signed)
? ?  Inpatient Rehab Admissions Coordinator : ? ?Per therapy recommendations, patient was screened for CIR candidacy by Blue Ruggerio RN MSN.  At this time patient appears to be a potential candidate for CIR. I will place a rehab consult per protocol for full assessment. Please call me with any questions. ? ?Jerriyah Louis RN MSN ?Admissions Coordinator ?[phone] ?  ?

## 2023-08-26 NOTE — Progress Notes (Addendum)
RUE arterial duplex and carotid duplex have been completed. Preliminary results given to Dr. Roda Shutters.   Results can be found under chart review under CV PROC. 08/26/2023 1:04 PM Leeya Rusconi RVT, RDMS

## 2023-08-26 NOTE — Progress Notes (Signed)
TRH night cross cover note:   I was notified by RN that the patient is experiencing some urinary retention, with postvoid residual noted to be 480 cc on bladder scan.  Patient's additional attempts at voiding have been unsuccessful.  I subsequently placed order for scheduled every 4 hour bladder scans with prn straight cath for postvoid residual bladder scan volume of greater than 350 cc.    Nicole Pigg, DO Hospitalist

## 2023-08-26 NOTE — Progress Notes (Signed)
Speech Language Pathology Treatment: Dysphagia  Patient Details Name: GETTIE SASSONE MRN: [id number] DOB: [birth year] Today's Date: 08/26/2023 Time: 0910-0950 SLP Time Calculation (min) (ACUTE ONLY): 40 min  Assessment / Plan / Recommendation Clinical Impression  Pt seen just after am meal. Family and pt reports tolerating thin liquids from a cup without anterior spillage. Intake of purees has been minimal so far. SLP observed pt sefl feeding with oral holding, left pocketing. Offered question cue to increase awareness, ineffective. Pt needed direct verbal cues to clear mouth of yogurt. After visit with vascular PA, SLP assisted pt in sequencing and sustaining attention to oral care task. Pt needed verbal cues to do more than minimal effort, problem solve multi-step task, incorporate left side of mouth into effort. Pt also given cues to over-articulate single words with greater volume, which she was successful at with 1:1 cues. Formal cognitive assessment not yet completed given arrival of tech for ultrasound. Continue diet, f/u.   HPI HPI: Clydene Kondo is an 82 yr old female who presented to [facility] via EMS on 08/25/2023. She was found down by family. EMS activated code stroke alert for left sided weakness and questionable rt gaze preference. 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 Small acute infarcts within the right insula and right subinsular  white matter.   PMH of CHF, Cardiomyopathy, HTN, pacemaker.      SLP Plan         Recommendations for follow up therapy are one component of a multi-disciplinary discharge planning process, led by the attending physician.  Recommendations may be updated based on patient status, additional functional criteria and insurance authorization.    Recommendations  Diet recommendations: Dysphagia 1 (puree);Thin liquid Liquids provided via: Straw Medication Administration: Whole meds with puree Supervision: Full  supervision/cueing for compensatory strategies Compensations: Slow rate;Small sips/bites;Monitor for anterior loss;Lingual sweep for clearance of pocketing;Multiple dry swallows after each bite/sip                  Oral care BID     Dysphagia, oropharyngeal phase (R13.12)           Novalynn Branaman, Riley Nearing  08/26/2023, 10:10 AM

## 2023-08-26 NOTE — Evaluation (Signed)
Physical Therapy Evaluation Patient Details Name: Nicole Chan MRN: [id number] DOB: October 04, 1940 Today's Date: 08/26/2023  History of Present Illness  82 yo female presents to [facility] on 11/27 with L weakness, code stroke. 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 PMH includes PMH of chronic systolic CHF, dilated cardiomyopathy, HTN, LBBB, and hypertensive heart disease, OA, tobacco abuse, ICD.  Clinical Impression   Pt presents with debility, impaired balance, impaired activity tolerance. Pt to benefit from acute PT to address deficits. Pt mod-max for bed mobility and transfer into standing, able to take lateral steps but did not progress away from bed given urinary incontinence and fatigue. PT to progress mobility as tolerated, and will continue to follow acutely.          If plan is discharge home, recommend the following: A lot of help with bathing/dressing/bathroom;A lot of help with walking and/or transfers   Can travel by private vehicle        Equipment Recommendations None recommended by PT  Recommendations for Other Services       Functional Status Assessment Patient has had a recent decline in their functional status and demonstrates the ability to make significant improvements in function in a reasonable and predictable amount of time.     Precautions / Restrictions Precautions Precautions: Fall;ICD/Pacemaker Restrictions Weight Bearing Restrictions: No      Mobility  Bed Mobility Overal bed mobility: Needs Assistance Bed Mobility: Supine to Sit, Sit to Supine     Supine to sit: Mod assist Sit to supine: +2 for safety/equipment, Max assist   General bed mobility comments: trunk  and le management    Transfers Overall transfer level: Needs assistance Equipment used: Rolling walker (2 wheels) Transfers: Sit to/from Stand, Bed to chair/wheelchair/BSC Sit to Stand: Mod assist   Step pivot transfers: Mod assist       General transfer comment: assist for rise, steady, wieght shifting for steps towards HOB. cues for upright posture, limited by urinary incontinence    Ambulation/Gait               General Gait Details: unable  Stairs            Wheelchair Mobility     Tilt Bed    Modified Rankin (Stroke Patients Only) Modified Rankin (Stroke Patients Only) Pre-Morbid Rankin Score: No significant disability Modified Rankin: Severe disability     Balance Overall balance assessment: Needs assistance, History of Falls Sitting-balance support: No upper extremity supported Sitting balance-Leahy Scale: Fair     Standing balance support: Bilateral upper extremity supported, During functional activity, Reliant on assistive device for balance Standing balance-Leahy Scale: Poor                               Pertinent Vitals/Pain Pain Assessment Pain Assessment: Faces Faces Pain Scale: Hurts little more Pain Location: generalized Pain Descriptors / Indicators: Guarding, Grimacing Pain Intervention(s): Limited activity within patient's tolerance, Monitored during session, Repositioned    Home Living Family/patient expects to be discharged to:: Private residence Living Arrangements: Other relatives (godson lives with her) Available Help at Discharge: Family Type of Home: House Home Access: Stairs to enter;Ramped entrance  Entrance Stairs-Number of Steps: 3   Home Layout: One level Home Equipment: Grab bars - tub/shower;Grab bars - toilet;Rolling Walker (2 wheels);Electric scooter      Prior Function Prior Level of Function : Independent/Modified Independent;History of Falls (last six months) (3 falls in the last 6 months)                ADLs Comments: uses scooter in community     Extremity/Trunk Assessment        Lower Extremity Assessment Lower Extremity Assessment: Generalized weakness (no focal L vs R weakness appeciated, but pt lethargic and difficulty with command following)    Cervical / Trunk Assessment Cervical / Trunk Assessment: Kyphotic  Communication   Communication Communication: Difficulty following commands/understanding;Difficulty communicating thoughts/reduced clarity of speech  Cognition Arousal: Lethargic Behavior During Therapy: WFL for tasks assessed/performed Overall Cognitive Status: Difficult to assess                                          General Comments      Exercises     Assessment/Plan    PT Assessment Patient needs continued PT services  PT Problem List Decreased strength;Decreased activity tolerance;Decreased balance;Decreased mobility;Decreased coordination;Decreased cognition;Decreased safety awareness;Cardiopulmonary status limiting activity;Pain       PT Treatment Interventions DME instruction;Stair training;Gait training;Functional mobility training;Therapeutic exercise;Balance training;Therapeutic activities;Neuromuscular re-education;Patient/family education    PT Goals (Current goals can be found in the Care Plan section)  Acute Rehab PT Goals Patient Stated Goal: home PT Goal Formulation: With patient/family Time For Goal Achievement: 09/10/23 Potential to Achieve Goals: Good    Frequency Min 1X/week     Co-evaluation               AM-PAC PT "6 Clicks" Mobility  Outcome Measure Help needed turning from your back to your side while in a flat bed without using bedrails?: A Lot Help needed moving from lying on your back to sitting on the side of a flat bed without using bedrails?: A Lot Help needed moving to and from a bed to a chair (including a wheelchair)?: A Lot Help needed standing up from a chair using  your arms (e.g., wheelchair or bedside chair)?: A Lot Help needed to walk in hospital room?: Total Help needed climbing 3-5 steps with a railing? : Total 6 Click Score: 10    End of Session   Activity Tolerance: Patient limited by fatigue;Patient limited by lethargy Patient left: in bed;with call bell/phone within reach;with bed alarm set;with nursing/sitter in room Nurse Communication: Mobility status PT Visit Diagnosis: Unsteadiness on feet (R26.81);Muscle weakness (generalized) (M62.81)    Time: 1030-1100 (x10 minutes not included in chargable time, vascular US finishing up) PT Time Calculation (min) (ACUTE ONLY): 30 min   Charges:   PT Evaluation $PT Eval Low Complexity: 1 Low   PT General Charges $$ ACUTE PT VISIT: 1 Visit         Marye Round, PT DPT Acute Rehabilitation Services Secure Chat Preferred  Office [phone]   Ciera Beckum E Stroup 08/26/2023, 11:05 AM

## 2023-08-26 NOTE — Plan of Care (Signed)
  Problem: Education: Goal: Knowledge of disease or condition will improve Outcome: Progressing Goal: Knowledge of secondary prevention will improve (MUST DOCUMENT ALL) Outcome: Progressing Goal: Knowledge of patient specific risk factors will improve Loraine Leriche N/A or DELETE if not current risk factor) Outcome: Progressing   Problem: Ischemic Stroke/TIA Tissue Perfusion: Goal: Complications of ischemic stroke/TIA will be minimized Outcome: Progressing   Problem: Coping: Goal: Will verbalize positive feelings about self Outcome: Progressing Goal: Will identify appropriate support needs Outcome: Progressing   Problem: Health Behavior/Discharge Planning: Goal: Ability to manage health-related needs will improve Outcome: Progressing Goal: Goals will be collaboratively established with patient/family Outcome: Progressing   Problem: Self-Care: Goal: Ability to participate in self-care as condition permits will improve Outcome: Not Progressing Goal: Verbalization of feelings and concerns over difficulty with self-care will improve Outcome: Progressing Goal: Ability to communicate needs accurately will improve Outcome: Progressing   Problem: Nutrition: Goal: Risk of aspiration will decrease Outcome: Progressing Goal: Dietary intake will improve Outcome: Progressing   Problem: Education: Goal: Knowledge of General Education information will improve Description: Including pain rating scale, medication(s)/side effects and non-pharmacologic comfort measures Outcome: Progressing   Problem: Health Behavior/Discharge Planning: Goal: Ability to manage health-related needs will improve Outcome: Progressing   Problem: Clinical Measurements: Goal: Ability to maintain clinical measurements within normal limits will improve Outcome: Progressing Goal: Will remain free from infection Outcome: Progressing Goal: Diagnostic test results will improve Outcome: Progressing Goal: Respiratory  complications will improve Outcome: Progressing Goal: Cardiovascular complication will be avoided Outcome: Progressing   Problem: Activity: Goal: Risk for activity intolerance will decrease Outcome: Progressing   Problem: Coping: Goal: Level of anxiety will decrease Outcome: Progressing   Problem: Elimination: Goal: Will not experience complications related to bowel motility Outcome: Progressing Goal: Will not experience complications related to urinary retention Outcome: Progressing   Problem: Pain Management: Goal: General experience of comfort will improve Outcome: Progressing   Problem: Safety: Goal: Ability to remain free from injury will improve Outcome: Progressing   Problem: Skin Integrity: Goal: Risk for impaired skin integrity will decrease Outcome: Progressing

## 2023-08-26 NOTE — Progress Notes (Signed)
STROKE TEAM PROGRESS NOTE   SUBJECTIVE (INTERVAL HISTORY) Her sons and daughter are at the bedside.  Overall her condition is stable. Pt is very drowsy sleepy and needed repetitive stimulation to open eyes and answer questions. Still has left facial droop and left hemiparesis.    OBJECTIVE Temp:  [98.3 F (36.8 C)-99.3 F (37.4 C)] 98.3 F (36.8 C) (11/28 1216) Pulse Rate:  [70-95] 70 (11/28 1216) Cardiac Rhythm: Ventricular paced (11/28 0800) Resp:  [16-18] 17 (11/28 1216) BP: (148-193)/(66-116) 159/79 (11/28 1216) SpO2:  [90 %-100 %] 97 % (11/28 1216)  Recent Labs  Lab 08/25/23 0827 08/25/23 1658  GLUCAP 159* 168*   Recent Labs  Lab 08/25/23 0823 08/25/23 0830 08/25/23 1535 08/26/23 0444  NA 138 141  --   --   K 3.7 3.7  --   --   CL 106 108  --   --   CO2 19*  --   --   --   GLUCOSE 169* 170*  --   --   BUN 17 17  --   --   CREATININE 0.98 0.90 0.81  --   CALCIUM 10.4*  --   --   --   PHOS  --   --   --  3.1   Recent Labs  Lab 08/25/23 0823  AST 27  ALT 23  ALKPHOS 73  BILITOT 1.5*  PROT 6.8  ALBUMIN 3.6   Recent Labs  Lab 08/25/23 0823 08/25/23 0830 08/25/23 1640  WBC 13.9*  --  12.5*  NEUTROABS 11.6*  --   --   HGB 14.9 15.6* 14.3  HCT 45.3 46.0 42.3  MCV 88.6  --  86.5  PLT 226  --  217   Recent Labs  Lab 08/25/23 1535  CKTOTAL 144   Recent Labs    08/25/23 0823  LABPROT 14.3  INR 1.1   Recent Labs    08/25/23 1035  COLORURINE YELLOW  LABSPEC 1.026  PHURINE 7.0  GLUCOSEU NEGATIVE  HGBUR SMALL*  BILIRUBINUR NEGATIVE  KETONESUR NEGATIVE  PROTEINUR 30*  NITRITE NEGATIVE  LEUKOCYTESUR NEGATIVE       Component Value Date/Time   CHOL 203 (H) 08/26/2023 0444   TRIG 136 08/26/2023 0444   HDL 35 (L) 08/26/2023 0444   CHOLHDL 5.8 08/26/2023 0444   VLDL 27 08/26/2023 0444   LDLCALC 141 (H) 08/26/2023 0444   Lab Results  Component Value Date   HGBA1C 5.4 08/25/2023      Component Value Date/Time   LABOPIA NONE DETECTED  08/25/2023 1035   COCAINSCRNUR NONE DETECTED 08/25/2023 1035   LABBENZ NONE DETECTED 08/25/2023 1035   AMPHETMU NONE DETECTED 08/25/2023 1035   THCU NONE DETECTED 08/25/2023 1035   LABBARB NONE DETECTED 08/25/2023 1035    Recent Labs  Lab 08/25/23 0823  ETH <10    I have personally reviewed the radiological images below and agree with the radiology interpretations.  VAS [place] UPPER EXTREMITY ARTERIAL DUPLEX  Result Date: 08/26/2023  UPPER EXTREMITY DUPLEX STUDY Patient Name:  Nicole Chan  Date of Exam:   08/26/2023 Medical Rec #: [id number]         Accession #:    [id number] Date of Birth: [birth year]        Patient Gender: F Patient Age:   82 years Exam Location:  [facility] Procedure:      VAS [place] UPPER EXTREMITY ARTERIAL DUPLEX Referring Phys: Scheryl Marten Chole Driver --------------------------------------------------------------------------------  Indications: Abnormal CT finding (filling defect  in subclavian artery). History:     Patient has a history of Currently admitted for CVA.  Risk Factors:  Hypertension, hyperlipidemia, current smoker. Other Factors: CHF, ICD, PAD Comparison Study: No previous exams Performing Technologist: Jody Hill RVT, RDMS  Examination Guidelines: A complete evaluation includes B-mode imaging, spectral Doppler, color Doppler, and power Doppler as needed of all accessible portions of each vessel. Bilateral testing is considered an integral part of a complete examination. Limited examinations for reoccurring indications may be performed as noted.  Right Doppler Findings: +---------------+----------+----------+-------------+--------+ Site           PSV (cm/s)Waveform  Stenosis     Comments +---------------+----------+----------+-------------+--------+ Subclavian Prox176       monophasic                      +---------------+----------+----------+-------------+--------+ Subclavian Mid 296       monophasic>50% stenosis          +---------------+----------+----------+-------------+--------+ Subclavian Dist310       monophasic>50% stenosis         +---------------+----------+----------+-------------+--------+ Axillary       532       monophasic>50% stenosis         +---------------+----------+----------+-------------+--------+ Brachial Prox  116       monophasic                      +---------------+----------+----------+-------------+--------+ Brachial Mid   99        monophasic                      +---------------+----------+----------+-------------+--------+ Brachial Dist  102       monophasic                      +---------------+----------+----------+-------------+--------+ Radial Mid     72        monophasic                      +---------------+----------+----------+-------------+--------+ Radial Dist    58        monophasic                      +---------------+----------+----------+-------------+--------+ Ulnar Mid      37        monophasic>50% stenosis         +---------------+----------+----------+-------------+--------+ Ulnar Dist     35        monophasic>50% stenosis         +---------------+----------+----------+-------------+--------+ Palmar Arch    28        monophasic                      +---------------+----------+----------+-------------+--------+   Summary:  Right: Post stenotic waveform in proximal subclavian may indicate a        more proximal occlusion. 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 *See table(s) above for measurements and observations. Suggest Peripheral Vascular Consult.    Preliminary    DG Abd 2 Views  Result Date: 08/25/2023 CLINICAL DATA:  [id number] Vomiting [id number] EXAM: ABDOMEN - 2 VIEW COMPARISON:  None Available. FINDINGS: The bowel gas pattern is normal. There is no evidence of free air. No radio-opaque calculi or other significant radiographic abnormality is seen. Excreted  contrast within the urinary bladder. IMPRESSION: Negative. Electronically Signed   By: Duanne Guess D.O.  On: 08/25/2023 21:45   DG Pelvis 1-2 Views  Result Date: 08/25/2023 CLINICAL DATA:  Pain EXAM: PELVIS - 1-2 VIEW COMPARISON:  None Available. FINDINGS: There is no evidence of pelvic fracture or diastasis. Hip joints are intact. Excreted contrast within the urinary bladder. IMPRESSION: Negative. Electronically Signed   By: Duanne Guess D.O.   On: 08/25/2023 21:44   ECHOCARDIOGRAM COMPLETE  Result Date: 08/25/2023    ECHOCARDIOGRAM REPORT   Patient Name:   Nicole Chan Date of Exam: 08/25/2023 Medical Rec #:  [id number]        Height:       67.0 in Accession #:    [id number]       Weight:       197.5 lb Date of Birth:  1941/01/02       BSA:          2.012 m Patient Age:    82 years         BP:           181/92 mmHg Patient Gender: F                HR:           88 bpm. Exam Location:  Inpatient Procedure: 2D Echo, Cardiac Doppler, Color Doppler and Saline Contrast Bubble            Study Indications:    Stroke  History:        Patient has prior history of Echocardiogram examinations, most                 recent 06/20/2015. CHF, Abnormal ECG and Defibrillator,                 Arrythmias:LBBB, Signs/Symptoms:Dizziness/Lightheadedness; Risk                 Factors:Hypertension, Current Smoker and Dyslipidemia.  Sonographer:    Sheralyn Boatman RDCS Referring Phys: [id number] [facility] GOEL IMPRESSIONS  1. There is apical LV cavity systolic obliteration with a dynamic "gradient" of up to 2 m/s. There is no evidence of LV outflow obstruction and there is no systolic anterior motion of the mitral valve at rest or with the Valsalva maneuver. Left ventricular ejection fraction, by estimation, is 45 to 50%. The left ventricle has mildly decreased function. The left ventricle has no regional wall motion abnormalities. There is moderate asymmetric left ventricular hypertrophy of the basal-septal and apical segments.  Indeterminate diastolic filling due to E-A fusion.  2. Right ventricular systolic function is normal. The right ventricular size is normal.  3. The mitral valve is degenerative. No evidence of mitral valve regurgitation. No evidence of mitral stenosis. Moderate mitral annular calcification.  4. The aortic valve is tricuspid. There is mild calcification of the aortic valve. There is moderate thickening of the aortic valve. Aortic valve regurgitation is trivial. Aortic valve sclerosis/calcification is present, without any evidence of aortic stenosis. Comparison(s): Prior images unable to be directly viewed, comparison made by report only. The left ventricular function has improved. FINDINGS  Left Ventricle: There is apical LV cavity systolic obliteration with a dynamic "gradient" of up to 2 m/s. There is no evidence of LV outflow obstruction and there is no systolic anterior motion of the mitral valve at rest or with the Valsalva maneuver. Left ventricular ejection fraction, by estimation, is 45 to 50%. The left ventricle has mildly decreased function. The left ventricle has no regional wall motion abnormalities. The left ventricular internal cavity  size was small. There is moderate asymmetric left ventricular hypertrophy of the basal-septal and apical segments. Abnormal (paradoxical) septal motion, consistent with left bundle branch block. Indeterminate diastolic filling due to E-A fusion. Right Ventricle: The right ventricular size is normal. No increase in right ventricular wall thickness. Right ventricular systolic function is normal. Left Atrium: Left atrial size was normal in size. Right Atrium: Right atrial size was normal in size. Pericardium: There is no evidence of pericardial effusion. Mitral Valve: The mitral valve is degenerative in appearance. Moderate mitral annular calcification. No evidence of mitral valve regurgitation. No evidence of mitral valve stenosis. Tricuspid Valve: The tricuspid valve is  grossly normal. Tricuspid valve regurgitation is not demonstrated. Aortic Valve: The aortic valve is tricuspid. There is mild calcification of the aortic valve. There is moderate thickening of the aortic valve. Aortic valve regurgitation is trivial. Aortic valve sclerosis/calcification is present, without any evidence of aortic stenosis. Pulmonic Valve: The pulmonic valve was not well visualized. Pulmonic valve regurgitation is not visualized. Aorta: The aortic root is normal in size and structure. IAS/Shunts: No atrial level shunt detected by color flow Doppler. Agitated saline contrast was given intravenously to evaluate for intracardiac shunting. Additional Comments: A device lead is visualized in the right ventricle.  LEFT VENTRICLE PLAX 2D LVIDd:         2.60 cm     Diastology LVIDs:         2.00 cm     LV e' medial:    3.70 cm/s LV PW:         1.75 cm     LV E/e' medial:  30.8 LV IVS:        2.15 cm     LV e' lateral:   4.35 cm/s LVOT diam:     1.90 cm     LV E/e' lateral: 26.2 LV SV:         56 LV SV Index:   28 LVOT Area:     2.84 cm  LV Volumes (MOD) LV vol d, MOD A2C: 62.1 ml LV vol d, MOD A4C: 61.9 ml LV vol s, MOD A2C: 30.0 ml LV vol s, MOD A4C: 27.9 ml LV SV MOD A2C:     32.1 ml LV SV MOD A4C:     61.9 ml LV SV MOD BP:      39.3 ml RIGHT VENTRICLE             IVC RV S prime:     11.40 cm/s  IVC diam: 1.30 cm TAPSE (M-mode): 1.6 cm LEFT ATRIUM           Index        RIGHT ATRIUM          Index LA diam:      1.10 cm 0.55 cm/m   RA Area:     8.61 cm LA Vol (A2C): 17.4 ml 8.65 ml/m   RA Volume:   15.70 ml 7.80 ml/m LA Vol (A4C): 30.2 ml 15.01 ml/m  AORTIC VALVE LVOT Vmax:   116.00 cm/s LVOT Vmean:  77.700 cm/s LVOT VTI:    0.196 m  AORTA Ao Root diam: 3.30 cm MITRAL VALVE MV Area (PHT): 6.27 cm     SHUNTS MV Decel Time: 121 msec     Systemic VTI:  0.20 m MV E velocity: 114.00 cm/s  Systemic Diam: 1.90 cm Rachelle Hora Croitoru MD Electronically signed by Thurmon Fair MD Signature Date/Time:  08/25/2023/5:20:55 PM    Final  MR BRAIN WO CONTRAST  Result Date: 08/25/2023 CLINICAL DATA:  Provided history: Stroke, follow-up. EXAM: MRI HEAD WITHOUT CONTRAST TECHNIQUE: Multiplanar, multiecho pulse sequences of the brain and surrounding structures were obtained without intravenous contrast. COMPARISON:  Non-contrast head CT and CT angiogram head/neck 08/25/2023. FINDINGS: Brain: No age advanced or lobar predominant parenchymal atrophy. Mild cerebellar atrophy. 1.5 x 2.3 x 2.9 cm acute infarct within the right corona radiata/basal ganglia. Small acute infarcts within the right insula and right subinsular white matter. Chronic lacunar infarcts within bilateral cerebral hemispheric white matter and basal ganglia. Small focus of diffusion-weighted signal hyperintensity at site of a chronic infarct in the left basal ganglia, likely reflecting susceptibility artifact from chronic blood products at this site. Background advanced patchy and confluent T2 FLAIR hyperintense signal abnormality within the cerebral white matter, nonspecific but compatible with chronic small vessel ischemic disease. Small chronic infarcts within the left cerebellar hemisphere. 9 mm extra-axial mass along the right aspect of the anterior falx, likely reflecting a meningioma (for instance as seen on series 11, image 41) (series 15, image 22). Contact upon the underlying right lobe without underlying parenchymal edema. There are a few chronic microhemorrhages scattered within the supratentorial brain. Partially empty sella turcica. No extra-axial fluid collection. No midline shift. Vascular: Maintained flow voids within the proximal large arterial vessels. 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 Skull and upper cervical spine: Indeterminate well-circumscribed 10 mm FLAIR hyperintense and T1 hypointense lesion within the left frontal calvarium (for instance as seen on  series 11, image 44). Sinuses/Orbits: No mass or acute finding within the imaged orbits. Minimal mucosal thickening within the right ethmoid and left maxillary sinuses. IMPRESSION: 1. 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 2. Small acute infarcts within the right insula and right subinsular white matter. 3. Background advanced chronic small ischemic disease with multiple chronic infarcts, as described. 4. 9 mm extra-axial mass along the right aspect of the anterior falx, likely reflecting a meningioma. Contact upon the underlying right frontal lobe without underlying parenchymal edema 5. Mild cerebellar atrophy. 6. Indeterminate 10 mm osseous lesion within the left frontal calvarium. Consider a follow-up brain MRI in 3 months to ensure stability. Electronically Signed   By: Jackey Loge D.O.   On: 08/25/2023 13:59   DG Chest Port 1 View  Result Date: 08/25/2023 CLINICAL DATA:  Weakness status post fall EXAM: PORTABLE CHEST 1 VIEW COMPARISON:  Chest radiograph dated 04/03/2021 FINDINGS: Lines/tubes: Left chest wall ICD leads project over the right atrium and ventricle and tributary of the coronary sinus. Lungs: Low lung volumes with bronchovascular crowding. No focal consolidation. Pleura: No pneumothorax or pleural effusion. Heart/mediastinum: The heart size and mediastinal contours are within normal limits. Bones: No radiographic finding of acute displaced fracture. IMPRESSION: 1. Low lung volumes with bronchovascular crowding. No focal consolidation. 2.  No radiographic finding of acute displaced fracture. Electronically Signed   By: Agustin Cree M.D.   On: 08/25/2023 10:29   CT ANGIO HEAD NECK W WO CM W PERF (CODE STROKE)  Result Date: 08/25/2023 CLINICAL DATA:  Neuro deficit, acute, stroke suspected EXAM: CT ANGIOGRAPHY HEAD AND NECK CT PERFUSION BRAIN TECHNIQUE: Multidetector CT imaging of the head and neck was performed using the standard protocol during bolus  administration of intravenous contrast. Multiplanar CT image reconstructions and MIPs were obtained to evaluate the vascular anatomy. Carotid stenosis measurements (when applicable) are obtained utilizing NASCET criteria,  using the distal internal carotid diameter as the denominator. Multiphase CT imaging of the brain was performed following IV bolus contrast injection. Subsequent parametric perfusion maps were calculated using RAPID software. RADIATION DOSE REDUCTION: This exam was performed according to the departmental dose-optimization program which includes automated exposure control, adjustment of the mA and/or kV according to patient size and/or use of iterative reconstruction technique. CONTRAST:  OMNIPAQUE IOHEXOL 350 MG/ML SOLN COMPARISON:  Same day CT head. FINDINGS: CTA NECK FINDINGS Aortic arch: Moderate stenosis of the right subclavian artery origin. Intraluminal filling defect within the proximal subclavian artery which is suspicious for thrombus. Ulcerated atherosclerosis of the left subclavian artery origin with mild narrowing. Right carotid system: Atherosclerosis at the carotid bifurcation with approximately 30% stenosis of the ICA origin. Left carotid system: Atherosclerosis at the carotid bifurcation involving the proximal ICA with proximally 50% stenosis of the proximal ICA. Vertebral arteries: Severe bilateral vertebral artery origin stenosis. Both vertebral arteries are patent. Left dominant. Skeleton: No acute abnormality on limited assessment. Multilevel degenerative change in the cervical spine. Other neck: Subcentimeter thyroid nodules which do not require imaging follow-up (ref: J Am Coll Radiol. 2015 Feb;12(2): 143-50). Upper chest: Visualized lung apices are clear. Review of the MIP images confirms the above findings CTA HEAD FINDINGS Anterior circulation: Bilateral intracranial ICAs are patent with mild to moderate narrowing bilaterally. 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 Posterior circulation: Bilateral intradural vertebral arteries, basilar artery and bilateral posterior cerebral arteries are are patent. Moderate right proximal P2 PCA stenosis. Venous sinuses: As permitted by contrast timing, patent. Review of the MIP images confirms the above findings CT Brain Perfusion Findings: ASPECTS: 10. CBF (<30%) Volume: 0mL Perfusion (Tmax>6.0s) volume: 0mL Mismatch Volume: 0mL Infarction Location:None identified. IMPRESSION: CTA head: 1. No emergent large vessel occlusion. 2. Moderate right proximal P2 PCA stenosis. 3. 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 CTA neck: 1. Approximately 50% stenosis of the proximal ICAs bilaterally in the neck. 2. Severe bilateral vertebral artery origin stenosis. 3. Moderate atherosclerotic narrowing of the right subclavian artery origin. Intraluminal filling defect within the subclavian artery in this region is suspicious for nonocclusive thrombus versus atherosclerotic plaque extending into the lumen. Right upper extremity ultrasound may be useful to further evaluate and also exclude more distal clot in the arm. 4. Aortic Atherosclerosis (ICD10-I70.0). CT perfusion: No evidence of core infarct or penumbra. Electronically Signed   By: Feliberto Harts M.D.   On: 08/25/2023 09:39   CT HEAD CODE STROKE WO CONTRAST  Result Date: 08/25/2023 CLINICAL DATA:  Code stroke. Neuro deficit with acute stroke suspected facial droop with left arm and leg weakness. EXAM: CT HEAD WITHOUT CONTRAST TECHNIQUE: Contiguous axial images were obtained from the base of the skull through the vertex without intravenous contrast. RADIATION DOSE REDUCTION: This exam was performed according to the departmental dose-optimization program which includes automated exposure control, adjustment of the mA and/or kV according to patient size and/or use of iterative reconstruction technique. COMPARISON:   None Available. FINDINGS: Brain: No evidence of acute infarction, hemorrhage, hydrocephalus, extra-axial collection or mass lesion/mass effect. Extensive chronic small vessel ischemia with gliosis and lacunar infarcts in the deep gray nuclei. None of these are clearly acute. Small chronic infarct in the left cerebellum. Vascular: Limited at the proximal MCA due to streak artifact. No convincing hyperdensity. Skull: No acute or aggressive finding. Lucency at the superior left frontal bone is likely a  incidental hemangioma. Sinuses/Orbits: No acute finding. Other: These results were communicated to Dr. Wilford Corner at 8:44 am on 08/25/2023 by epic chat. ASPECTS [facility] Stroke Program Early CT Score) - Ganglionic level infarction (caudate, lentiform nuclei, internal capsule, insula, M1-M3 cortex): 7 - Supraganglionic infarction (M4-M6 cortex): 3 Total score (0-10 with 10 being normal): 10 IMPRESSION: 1. No acute finding. 2. Extensive chronic small vessel ischemia. Electronically Signed   By: Tiburcio Pea M.D.   On: 08/25/2023 08:46     PHYSICAL EXAM  Temp:  [98.3 F (36.8 C)-99.3 F (37.4 C)] 98.3 F (36.8 C) (11/28 1216) Pulse Rate:  [70-95] 70 (11/28 1216) Resp:  [16-18] 17 (11/28 1216) BP: (148-193)/(66-116) 159/79 (11/28 1216) SpO2:  [90 %-100 %] 97 % (11/28 1216)  General - Well nourished, well developed, drowsy sleepy.  Ophthalmologic - fundi not visualized due to noncooperation.  Cardiovascular - Regular rhythm and rate.  Neuro - drowsy sleepy and needed constant stimulation to keep eyes open, orientated to age, place, time and people. No aphasia but limited language output, answer questions with words but following all simple commands. No gaze palsy, tracking bilaterally, visual field full. Left facial droop. Tongue protrusion to the left. LUE drift but not touch bed, RUE no drift 4/5. LLE proximal 3-/5 and RLE proximal 3/5, distally 3/5 bilaterally. Sensation symmetrical bilaterally  subjectively, b/l FTN no obvious ataxia but L FTN can not complete, gait not tested.     ASSESSMENT/PLAN Nicole Chan is a 82 y.o. female with history of cardiomyopathy and CHF with pacemaker, hypertension, smoker, PAD admitted for found down at home with left-sided weakness, left facial droop and slurred speech. No tPA given due to outside window.    Stroke:  right BG/CR and 2 small insular cortex infarcts, concerning secondary to large vessel disease from right ICA stenosis CT no acute abnormality CT head and neck bilateral ICA proximal 50% stenosis, right P2 severe stenosis, bilateral VA origin stenosis, right subclavian artery proximal ulcerated plaque MRI right BG/CRinfarct and 2 small insular and subinsular infarcts. 2D Echo EF 40 to 45%, LV cavity systolic obliteration Pacemaker interrogation pending LDL 141 HgbA1c 5.4 UDS negative Lovenox for VTE prophylaxis No antithrombotic prior to admission, now on aspirin 81 mg daily and clopidogrel 75 mg daily DAPT. Ongoing aggressive stroke risk factor management Therapy recommendations: CIR Disposition: Pending  Possible symptomatic right ICA stenosis CT head and neck bilateral ICA proximal 50% stenosis Carotid Doppler bilateral ICA 40 to 59% stenosis Vascular surgery consulted Appreciate assistance  Cardiomyopathy Status post pacemaker Pacemaker interrogation pending EF 40 to 45% On Coreg and ARB PTA  Hypertension Stable on the high end gradually reach BP goal within 2-3 days. BP goal 130-160 before carotid revascularization Long term BP goal normotensive  Hyperlipidemia Home meds: Lipitor 20 LDL 141, goal < 70 Now on liver 80 Continue statin at discharge  Tobacco abuse Current smoker Smoking cessation counseling provided Pt is willing to quit  Other Stroke Risk Factors Advanced age Obesity, Body mass index is 30.94 kg/m.  PAD - R extra artery stenosis on aterial Doppler, right subclavian artery proximal  ulcerated plaque  Other Active Problems Leukocytosis WBC 13.9--12.5  Hospital day # 1  I discussed with Dr. Robb Matar and [facility] service. I spent extensive face-to-face time with the patient, more than 50% of which was spent in counseling and coordination of care, reviewing test results, images and medication, and discussing the diagnosis, treatment plan and potential prognosis. This patient's care requiresreview of multiple  databases, neurological assessment, discussion with family, other specialists and medical decision making of high complexity. I had long discussion with family at bedside, updated pt current condition, treatment plan and potential prognosis, and answered all the questions.  They expressed understanding and appreciation.    Marvel Plan, MD PhD Stroke Neurology 08/26/2023 12:46 PM    To contact Stroke Continuity provider, please refer to [email or web address]. After hours, contact General Neurology

## 2023-08-26 NOTE — Progress Notes (Signed)
[facility] PROGRESS NOTE    Progress Note  Nicole Chan  WNU:[id number] DOB: 10/28/1940 DOA: 08/25/2023 PCP: Andi Devon, MD     Brief Narrative:   Nicole Chan is an 82 y.o. female past medical history significant for essential hypertension, was of the history was obtained from the chart and son at bedside.  He was found on the floor at bedside at 7:30 AM on the day of admission, last time known to be normal was 10 PM the night before.  She smelled like urine was having dysarthria and right arm weakness with poor mentation.  CTA of the head showed no acute findings extensive chronic small vessel disease.  Assessment/Plan:   Acute CVA: MRI of the brain confirmed acute infarct to the corona radiator, and multiple small acute infarcts to the right insula and subinsular white matter and an indeterminate 10 mm osseous lesion in the calvarium HgbA1c 5.6, fasting lipid panel HDL 35, LDL 141 start Crestor PT, OT, Speech consult pending CT angio of the head and neck showed no large LVO moderate right proximal PCA stenosis. Transthoracic Echo showed an EF of 50%, no wall motion abnormality.   Start patient on ASA 81mg  daily.  Prior to admission she was on no antiplatelet therapy. Crestor daily BP goal: permissive HTN upto 220/120 mmHg for the next 48 hours. No events on telemetry monitoring Neurology has been consulted. She is a current tobacco smoker she has been consulted on quitting.  Incidental subclavian artery thrombosis: Seen on CT head and neck that showed moderate filling defect nonocclusive. Upper extremity ultrasound has been ordered.  Results are pending.  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  No underlying edema.  Will need a 20-month follow-up. SPEP and UPEP has been sent for concerns of multiple myeloma in the setting of hypercalcemia.  Low yield in the setting of normal  renal function and unremarkable hemoglobin  Mild hypercalcemia: Started on IV fluids, question due to dehydration recheck.  Leukocytosis: Likely reactive has remained afebrile Tmax 99.3.  Tobacco abuse: She has been counseled family has been informed that she needs to quit smoking.   DVT prophylaxis: lovenox Family Communication:none Status is: Inpatient Remains inpatient appropriate because: Acute CVA    Code Status:     Code Status Orders  (From admission, onward)           Start     Ordered   08/25/23 1315  Full code  Continuous       Question:  By:  Answer:  Other   08/25/23 1315           Code Status History     Date Active Date Inactive Code Status Order ID Comments User Context   07/11/2015 1536 07/12/2015 1216 Full Code [id number]  Marinus Maw, MD Inpatient   10/29/2014 1338 10/29/2014 1912 Full Code [id number]  Lyn Records, MD Inpatient   10/24/2014 1937 10/26/2014 2243 Full Code [id number]  Nahser, Deloris Ping, MD Inpatient         IV Access:   Peripheral IV   Procedures and diagnostic studies:   DG Abd 2 Views  Result Date: 08/25/2023 CLINICAL DATA:  [id number] Vomiting [id number] EXAM: ABDOMEN - 2 VIEW COMPARISON:  None Available. FINDINGS: The bowel gas pattern is normal. There is no evidence of free air. No radio-opaque calculi or other significant radiographic abnormality is seen. Excreted contrast within the urinary bladder.  IMPRESSION: Negative. Electronically Signed   By: Duanne Guess D.O.   On: 08/25/2023 21:45   DG Pelvis 1-2 Views  Result Date: 08/25/2023 CLINICAL DATA:  Pain EXAM: PELVIS - 1-2 VIEW COMPARISON:  None Available. FINDINGS: There is no evidence of pelvic fracture or diastasis. Hip joints are intact. Excreted contrast within the urinary bladder. IMPRESSION: Negative. Electronically Signed   By: Duanne Guess D.O.   On: 08/25/2023 21:44   ECHOCARDIOGRAM COMPLETE  Result Date: 08/25/2023    ECHOCARDIOGRAM REPORT    Patient Name:   Nicole Chan Date of Exam: 08/25/2023 Medical Rec #:  [id number]        Height:       67.0 in Accession #:    [id number]       Weight:       197.5 lb Date of Birth:  29-Jun-1941       BSA:          2.012 m Patient Age:    82 years         BP:           181/92 mmHg Patient Gender: F                HR:           88 bpm. Exam Location:  Inpatient Procedure: 2D Echo, Cardiac Doppler, Color Doppler and Saline Contrast Bubble            Study Indications:    Stroke  History:        Patient has prior history of Echocardiogram examinations, most                 recent 06/20/2015. CHF, Abnormal ECG and Defibrillator,                 Arrythmias:LBBB, Signs/Symptoms:Dizziness/Lightheadedness; Risk                 Factors:Hypertension, Current Smoker and Dyslipidemia.  Sonographer:    Sheralyn Boatman RDCS Referring Phys: [id number] [facility] GOEL IMPRESSIONS  1. There is apical LV cavity systolic obliteration with a dynamic "gradient" of up to 2 m/s. There is no evidence of LV outflow obstruction and there is no systolic anterior motion of the mitral valve at rest or with the Valsalva maneuver. Left ventricular ejection fraction, by estimation, is 45 to 50%. The left ventricle has mildly decreased function. The left ventricle has no regional wall motion abnormalities. There is moderate asymmetric left ventricular hypertrophy of the basal-septal and apical segments. Indeterminate diastolic filling due to E-A fusion.  2. Right ventricular systolic function is normal. The right ventricular size is normal.  3. The mitral valve is degenerative. No evidence of mitral valve regurgitation. No evidence of mitral stenosis. Moderate mitral annular calcification.  4. The aortic valve is tricuspid. There is mild calcification of the aortic valve. There is moderate thickening of the aortic valve. Aortic valve regurgitation is trivial. Aortic valve sclerosis/calcification is present, without any evidence of aortic stenosis.  Comparison(s): Prior images unable to be directly viewed, comparison made by report only. The left ventricular function has improved. FINDINGS  Left Ventricle: There is apical LV cavity systolic obliteration with a dynamic "gradient" of up to 2 m/s. There is no evidence of LV outflow obstruction and there is no systolic anterior motion of the mitral valve at rest or with the Valsalva maneuver. Left ventricular ejection fraction, by estimation, is 45 to 50%. The left ventricle has mildly decreased function.  The left ventricle has no regional wall motion abnormalities. The left ventricular internal cavity size was small. There is moderate asymmetric left ventricular hypertrophy of the basal-septal and apical segments. Abnormal (paradoxical) septal motion, consistent with left bundle branch block. Indeterminate diastolic filling due to E-A fusion. Right Ventricle: The right ventricular size is normal. No increase in right ventricular wall thickness. Right ventricular systolic function is normal. Left Atrium: Left atrial size was normal in size. Right Atrium: Right atrial size was normal in size. Pericardium: There is no evidence of pericardial effusion. Mitral Valve: The mitral valve is degenerative in appearance. Moderate mitral annular calcification. No evidence of mitral valve regurgitation. No evidence of mitral valve stenosis. Tricuspid Valve: The tricuspid valve is grossly normal. Tricuspid valve regurgitation is not demonstrated. Aortic Valve: The aortic valve is tricuspid. There is mild calcification of the aortic valve. There is moderate thickening of the aortic valve. Aortic valve regurgitation is trivial. Aortic valve sclerosis/calcification is present, without any evidence of aortic stenosis. Pulmonic Valve: The pulmonic valve was not well visualized. Pulmonic valve regurgitation is not visualized. Aorta: The aortic root is normal in size and structure. IAS/Shunts: No atrial level shunt detected by color  flow Doppler. Agitated saline contrast was given intravenously to evaluate for intracardiac shunting. Additional Comments: A device lead is visualized in the right ventricle.  LEFT VENTRICLE PLAX 2D LVIDd:         2.60 cm     Diastology LVIDs:         2.00 cm     LV e' medial:    3.70 cm/s LV PW:         1.75 cm     LV E/e' medial:  30.8 LV IVS:        2.15 cm     LV e' lateral:   4.35 cm/s LVOT diam:     1.90 cm     LV E/e' lateral: 26.2 LV SV:         56 LV SV Index:   28 LVOT Area:     2.84 cm  LV Volumes (MOD) LV vol d, MOD A2C: 62.1 ml LV vol d, MOD A4C: 61.9 ml LV vol s, MOD A2C: 30.0 ml LV vol s, MOD A4C: 27.9 ml LV SV MOD A2C:     32.1 ml LV SV MOD A4C:     61.9 ml LV SV MOD BP:      39.3 ml RIGHT VENTRICLE             IVC RV S prime:     11.40 cm/s  IVC diam: 1.30 cm TAPSE (M-mode): 1.6 cm LEFT ATRIUM           Index        RIGHT ATRIUM          Index LA diam:      1.10 cm 0.55 cm/m   RA Area:     8.61 cm LA Vol (A2C): 17.4 ml 8.65 ml/m   RA Volume:   15.70 ml 7.80 ml/m LA Vol (A4C): 30.2 ml 15.01 ml/m  AORTIC VALVE LVOT Vmax:   116.00 cm/s LVOT Vmean:  77.700 cm/s LVOT VTI:    0.196 m  AORTA Ao Root diam: 3.30 cm MITRAL VALVE MV Area (PHT): 6.27 cm     SHUNTS MV Decel Time: 121 msec     Systemic VTI:  0.20 m MV E velocity: 114.00 cm/s  Systemic Diam: 1.90 cm Thurmon Fair MD Electronically signed  by Thurmon Fair MD Signature Date/Time: 08/25/2023/5:20:55 PM    Final    MR BRAIN WO CONTRAST  Result Date: 08/25/2023 CLINICAL DATA:  Provided history: Stroke, follow-up. EXAM: MRI HEAD WITHOUT CONTRAST TECHNIQUE: Multiplanar, multiecho pulse sequences of the brain and surrounding structures were obtained without intravenous contrast. COMPARISON:  Non-contrast head CT and CT angiogram head/neck 08/25/2023. FINDINGS: Brain: No age advanced or lobar predominant parenchymal atrophy. Mild cerebellar atrophy. 1.5 x 2.3 x 2.9 cm acute infarct within the right corona radiata/basal ganglia. Small acute  infarcts within the right insula and right subinsular white matter. Chronic lacunar infarcts within bilateral cerebral hemispheric white matter and basal ganglia. Small focus of diffusion-weighted signal hyperintensity at site of a chronic infarct in the left basal ganglia, likely reflecting susceptibility artifact from chronic blood products at this site. Background advanced patchy and confluent T2 FLAIR hyperintense signal abnormality within the cerebral white matter, nonspecific but compatible with chronic small vessel ischemic disease. Small chronic infarcts within the left cerebellar hemisphere. 9 mm extra-axial mass along the right aspect of the anterior falx, likely reflecting a meningioma (for instance as seen on series 11, image 41) (series 15, image 22). Contact upon the underlying right lobe without underlying parenchymal edema. There are a few chronic microhemorrhages scattered within the supratentorial brain. Partially empty sella turcica. No extra-axial fluid collection. No midline shift. Vascular: Maintained flow voids within the proximal large arterial vessels. 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 Skull and upper cervical spine: Indeterminate well-circumscribed 10 mm FLAIR hyperintense and T1 hypointense lesion within the left frontal calvarium (for instance as seen on series 11, image 44). Sinuses/Orbits: No mass or acute finding within the imaged orbits. Minimal mucosal thickening within the right ethmoid and left maxillary sinuses. IMPRESSION: 1. 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 2. Small acute infarcts within the right insula and right subinsular white matter. 3. Background advanced chronic small ischemic disease with multiple chronic infarcts, as described. 4. 9 mm extra-axial mass along the right aspect of the anterior falx, likely reflecting a meningioma. Contact upon the underlying  right frontal lobe without underlying parenchymal edema 5. Mild cerebellar atrophy. 6. Indeterminate 10 mm osseous lesion within the left frontal calvarium. Consider a follow-up brain MRI in 3 months to ensure stability. Electronically Signed   By: Jackey Loge D.O.   On: 08/25/2023 13:59   DG Chest Port 1 View  Result Date: 08/25/2023 CLINICAL DATA:  Weakness status post fall EXAM: PORTABLE CHEST 1 VIEW COMPARISON:  Chest radiograph dated 04/03/2021 FINDINGS: Lines/tubes: Left chest wall ICD leads project over the right atrium and ventricle and tributary of the coronary sinus. Lungs: Low lung volumes with bronchovascular crowding. No focal consolidation. Pleura: No pneumothorax or pleural effusion. Heart/mediastinum: The heart size and mediastinal contours are within normal limits. Bones: No radiographic finding of acute displaced fracture. IMPRESSION: 1. Low lung volumes with bronchovascular crowding. No focal consolidation. 2.  No radiographic finding of acute displaced fracture. Electronically Signed   By: Agustin Cree M.D.   On: 08/25/2023 10:29   CT ANGIO HEAD NECK W WO CM W PERF (CODE STROKE)  Result Date: 08/25/2023 CLINICAL DATA:  Neuro deficit, acute, stroke suspected EXAM: CT ANGIOGRAPHY HEAD AND NECK CT PERFUSION BRAIN TECHNIQUE: Multidetector CT imaging of the head and neck was performed using the standard protocol during bolus administration of intravenous contrast. Multiplanar CT image reconstructions and MIPs were obtained  to evaluate the vascular anatomy. Carotid stenosis measurements (when applicable) are obtained utilizing NASCET criteria, using the distal internal carotid diameter as the denominator. Multiphase CT imaging of the brain was performed following IV bolus contrast injection. Subsequent parametric perfusion maps were calculated using RAPID software. RADIATION DOSE REDUCTION: This exam was performed according to the departmental dose-optimization program which includes automated  exposure control, adjustment of the mA and/or kV according to patient size and/or use of iterative reconstruction technique. CONTRAST:  OMNIPAQUE IOHEXOL 350 MG/ML SOLN COMPARISON:  Same day CT head. FINDINGS: CTA NECK FINDINGS Aortic arch: Moderate stenosis of the right subclavian artery origin. Intraluminal filling defect within the proximal subclavian artery which is suspicious for thrombus. Ulcerated atherosclerosis of the left subclavian artery origin with mild narrowing. Right carotid system: Atherosclerosis at the carotid bifurcation with approximately 30% stenosis of the ICA origin. Left carotid system: Atherosclerosis at the carotid bifurcation involving the proximal ICA with proximally 50% stenosis of the proximal ICA. Vertebral arteries: Severe bilateral vertebral artery origin stenosis. Both vertebral arteries are patent. Left dominant. Skeleton: No acute abnormality on limited assessment. Multilevel degenerative change in the cervical spine. Other neck: Subcentimeter thyroid nodules which do not require imaging follow-up (ref: J Am Coll Radiol. 2015 Feb;12(2): 143-50). Upper chest: Visualized lung apices are clear. Review of the MIP images confirms the above findings CTA HEAD FINDINGS Anterior circulation: Bilateral intracranial ICAs are patent with mild to moderate narrowing bilaterally. 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 Posterior circulation: Bilateral intradural vertebral arteries, basilar artery and bilateral posterior cerebral arteries are are patent. Moderate right proximal P2 PCA stenosis. Venous sinuses: As permitted by contrast timing, patent. Review of the MIP images confirms the above findings CT Brain Perfusion Findings: ASPECTS: 10. CBF (<30%) Volume: 0mL Perfusion (Tmax>6.0s) volume: 0mL Mismatch Volume: 0mL Infarction Location:None identified. IMPRESSION: CTA head: 1. No emergent large vessel occlusion. 2. Moderate  right proximal P2 PCA stenosis. 3. 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 CTA neck: 1. Approximately 50% stenosis of the proximal ICAs bilaterally in the neck. 2. Severe bilateral vertebral artery origin stenosis. 3. Moderate atherosclerotic narrowing of the right subclavian artery origin. Intraluminal filling defect within the subclavian artery in this region is suspicious for nonocclusive thrombus versus atherosclerotic plaque extending into the lumen. Right upper extremity ultrasound may be useful to further evaluate and also exclude more distal clot in the arm. 4. Aortic Atherosclerosis (ICD10-I70.0). CT perfusion: No evidence of core infarct or penumbra. Electronically Signed   By: Feliberto Harts M.D.   On: 08/25/2023 09:39   CT HEAD CODE STROKE WO CONTRAST  Result Date: 08/25/2023 CLINICAL DATA:  Code stroke. Neuro deficit with acute stroke suspected facial droop with left arm and leg weakness. EXAM: CT HEAD WITHOUT CONTRAST TECHNIQUE: Contiguous axial images were obtained from the base of the skull through the vertex without intravenous contrast. RADIATION DOSE REDUCTION: This exam was performed according to the departmental dose-optimization program which includes automated exposure control, adjustment of the mA and/or kV according to patient size and/or use of iterative reconstruction technique. COMPARISON:  None Available. FINDINGS: Brain: No evidence of acute infarction, hemorrhage, hydrocephalus, extra-axial collection or mass lesion/mass effect. Extensive chronic small vessel ischemia with gliosis and lacunar infarcts in the deep gray nuclei. None of these are clearly acute. Small chronic infarct in the left cerebellum. Vascular: Limited at the proximal MCA due to streak artifact. No convincing hyperdensity. Skull:  No acute or aggressive finding. Lucency at the superior left frontal bone is likely a incidental hemangioma. Sinuses/Orbits: No acute finding. Other: These  results were communicated to Dr. Wilford Corner at 8:44 am on 08/25/2023 by epic chat. ASPECTS [facility] Stroke Program Early CT Score) - Ganglionic level infarction (caudate, lentiform nuclei, internal capsule, insula, M1-M3 cortex): 7 - Supraganglionic infarction (M4-M6 cortex): 3 Total score (0-10 with 10 being normal): 10 IMPRESSION: 1. No acute finding. 2. Extensive chronic small vessel ischemia. Electronically Signed   By: Tiburcio Pea M.D.   On: 08/25/2023 08:46     Medical Consultants:   None.   Subjective:    Morey Hummingbird she denies any pain, she does relate she still smoke.  Objective:    Vitals:   08/25/23 1500 08/25/23 1946 08/25/23 2354 08/26/23 0328  BP: (!) 150/66 (!) 162/116 (!) 148/91 (!) 179/86  Pulse: 95 90 72 77  Resp: 17 18 18 18   Temp: 98.4 F (36.9 C) 98.5 F (36.9 C) 99 F (37.2 C) 99.3 F (37.4 C)  TempSrc: Oral Oral Oral Oral  SpO2: 100% 94% 94% 92%  Weight:       SpO2: 92 %   Intake/Output Summary (Last 24 hours) at 08/26/2023 0659 Last data filed at 08/25/2023 2114 Gross per 24 hour  Intake 157.44 ml  Output 1000 ml  Net -842.56 ml   Filed Weights   08/25/23 0800  Weight: 89.6 kg    Exam: General exam: In no acute distress. Respiratory system: Good air movement and clear to auscultation. Cardiovascular system: S1 & S2 heard, RRR. No JVD.  Gastrointestinal system: Abdomen is nondistended, soft and nontender.  Extremities: No pedal edema. Skin: No rashes, lesions or ulcers Psychiatry: Judgement and insight appear normal. Mood & affect appropriate.    Data Reviewed:    Labs: Basic Metabolic Panel: Recent Labs  Lab 08/25/23 0823 08/25/23 0830 08/25/23 1535 08/26/23 0444  NA 138 141  --   --   K 3.7 3.7  --   --   CL 106 108  --   --   CO2 19*  --   --   --   GLUCOSE 169* 170*  --   --   BUN 17 17  --   --   CREATININE 0.98 0.90 0.81  --   CALCIUM 10.4*  --   --   --   PHOS  --   --   --  3.1   GFR Estimated Creatinine  Clearance: 61.5 mL/min (by C-G formula based on SCr of 0.81 mg/dL). Liver Function Tests: Recent Labs  Lab 08/25/23 0823  AST 27  ALT 23  ALKPHOS 73  BILITOT 1.5*  PROT 6.8  ALBUMIN 3.6   No results for input(s): "LIPASE", "AMYLASE" in the last 168 hours. No results for input(s): "AMMONIA" in the last 168 hours. Coagulation profile Recent Labs  Lab 08/25/23 0823  INR 1.1   COVID-19 Labs  No results for input(s): "DDIMER", "FERRITIN", "LDH", "CRP" in the last 72 hours.  Lab Results  Component Value Date   SARSCOV2NAA Not Detected 04/12/2019    CBC: Recent Labs  Lab 08/25/23 0823 08/25/23 0830 08/25/23 1640  WBC 13.9*  --  12.5*  NEUTROABS 11.6*  --   --   HGB 14.9 15.6* 14.3  HCT 45.3 46.0 42.3  MCV 88.6  --  86.5  PLT 226  --  217   Cardiac Enzymes: Recent Labs  Lab 08/25/23 1535  CKTOTAL  144   BNP (last 3 results) No results for input(s): "PROBNP" in the last 8760 hours. CBG: Recent Labs  Lab 08/25/23 0827 08/25/23 1658  GLUCAP 159* 168*   D-Dimer: No results for input(s): "DDIMER" in the last 72 hours. Hgb A1c: Recent Labs    08/25/23 1640  HGBA1C 5.4   Lipid Profile: Recent Labs    08/26/23 0444  CHOL 203*  HDL 35*  LDLCALC 141*  TRIG 136  CHOLHDL 5.8   Thyroid function studies: No results for input(s): "TSH", "T4TOTAL", "T3FREE", "THYROIDAB" in the last 72 hours.  Invalid input(s): "FREET3" Anemia work up: No results for input(s): "VITAMINB12", "FOLATE", "FERRITIN", "TIBC", "IRON", "RETICCTPCT" in the last 72 hours. Sepsis Labs: Recent Labs  Lab 08/25/23 0823 08/25/23 1640  WBC 13.9* 12.5*   Microbiology No results found for this or any previous visit (from the past 240 hour(s)).   Medications:     stroke: early stages of recovery book   Does not apply Once   aspirin  300 mg Rectal Daily   Or   aspirin  325 mg Oral Daily   atorvastatin  40 mg Oral Daily   clopidogrel  75 mg Oral Daily   enoxaparin (LOVENOX)  injection  40 mg Subcutaneous Q24H   mouth rinse  15 mL Mouth Rinse 4 times per day   Continuous Infusions:  sodium chloride 100 mL/hr at 08/26/23 0103      LOS: 1 day   Marinda Elk  [facility]  08/26/2023, 6:59 AM

## 2023-08-26 NOTE — Progress Notes (Signed)
Patient had no urine output throughout the shift. RN provided education on the use of the PureWick system and reassured the patient that it would prevent wetness. A bladder scan was performed at 0612, revealing 312 mL of urine in the bladder. Plan to continue monitoring closely and encourage the patient to urinate independently before considering catheterization

## 2023-08-27 ENCOUNTER — Inpatient Hospital Stay (HOSPITAL_COMMUNITY): Payer: Medicare Other

## 2023-08-27 DIAGNOSIS — R1310 Dysphagia, unspecified: Secondary | ICD-10-CM

## 2023-08-27 DIAGNOSIS — I639 Cerebral infarction, unspecified: Secondary | ICD-10-CM | POA: Diagnosis not present

## 2023-08-27 DIAGNOSIS — I5020 Unspecified systolic (congestive) heart failure: Secondary | ICD-10-CM | POA: Diagnosis not present

## 2023-08-27 DIAGNOSIS — I63231 Cerebral infarction due to unspecified occlusion or stenosis of right carotid arteries: Secondary | ICD-10-CM

## 2023-08-27 DIAGNOSIS — E876 Hypokalemia: Secondary | ICD-10-CM

## 2023-08-27 DIAGNOSIS — Z720 Tobacco use: Secondary | ICD-10-CM

## 2023-08-27 DIAGNOSIS — I748 Embolism and thrombosis of other arteries: Secondary | ICD-10-CM | POA: Diagnosis not present

## 2023-08-27 DIAGNOSIS — D72829 Elevated white blood cell count, unspecified: Secondary | ICD-10-CM

## 2023-08-27 DIAGNOSIS — I6523 Occlusion and stenosis of bilateral carotid arteries: Secondary | ICD-10-CM | POA: Diagnosis not present

## 2023-08-27 DIAGNOSIS — I10 Essential (primary) hypertension: Secondary | ICD-10-CM

## 2023-08-27 LAB — BASIC METABOLIC PANEL
Anion gap: 9 (ref 5–15)
BUN: 18 mg/dL (ref 8–23)
CO2: 23 mmol/L (ref 22–32)
Calcium: 9.8 mg/dL (ref 8.9–10.3)
Chloride: 108 mmol/L (ref 98–111)
Creatinine, Ser: 0.9 mg/dL (ref 0.44–1.00)
GFR, Estimated: >60 mL/min (ref >60)
Glucose, Bld: 114 mg/dL — ABNORMAL HIGH (ref 70–99)
Potassium: 3.3 mmol/L — ABNORMAL LOW (ref 3.5–5.1)
Sodium: 140 mmol/L (ref 135–145)

## 2023-08-27 LAB — GLUCOSE, CAPILLARY
Glucose-Capillary: 140 mg/dL — ABNORMAL HIGH (ref 70–99)
Glucose-Capillary: 144 mg/dL — ABNORMAL HIGH (ref 70–99)

## 2023-08-27 LAB — PARATHYROID HORMONE, INTACT (NO CA): PTH: 59 pg/mL (ref 15–65)

## 2023-08-27 MED ORDER — CARVEDILOL 12.5 MG PO TABS
12.5000 mg | ORAL_TABLET | Freq: Two times a day (BID) | ORAL | Status: DC
  Administered 2023-08-27: 12.5 mg via ORAL
  Filled 2023-08-27 (×2): qty 1

## 2023-08-27 MED ORDER — IRBESARTAN 300 MG PO TABS
300.0000 mg | ORAL_TABLET | Freq: Every day | ORAL | Status: DC
  Administered 2023-08-27 – 2023-08-28 (×2): 300 mg via ORAL
  Filled 2023-08-27 (×2): qty 1

## 2023-08-27 MED ORDER — FOOD THICKENER (SIMPLYTHICK)
10.0000 | Freq: Once | ORAL | Status: AC
  Administered 2023-08-27: 10 via ORAL
  Filled 2023-08-27: qty 10

## 2023-08-27 MED ORDER — POTASSIUM CHLORIDE CRYS ER 20 MEQ PO TBCR
40.0000 meq | EXTENDED_RELEASE_TABLET | Freq: Two times a day (BID) | ORAL | Status: AC
  Administered 2023-08-27 (×2): 40 meq via ORAL
  Filled 2023-08-27 (×2): qty 2

## 2023-08-27 MED ORDER — HYDROCHLOROTHIAZIDE 12.5 MG PO TABS
12.5000 mg | ORAL_TABLET | Freq: Every day | ORAL | Status: DC
  Administered 2023-08-27 – 2023-08-28 (×2): 12.5 mg via ORAL
  Filled 2023-08-27 (×2): qty 1

## 2023-08-27 NOTE — Progress Notes (Signed)
Speech Language Pathology Treatment: Dysphagia  Patient Details Name: Nicole Chan MRN: [id number] DOB: 09/24/1941 Today's Date: 08/27/2023 Time: 4098-1191 SLP Time Calculation (min) (ACUTE ONLY): 20 min  Assessment / Plan / Recommendation Clinical Impression  Nicole Chan was alert and participatory. Family at bedside.  She had a baseline cough today that was congested. BS are clear per chart review. She demonstrated intermittent coughing associated with thin, nectar-thick, and honey-thick liquids. Family reports a baseline cough PTA. Recommend pursuing MBS this afternoon if possible to ensure aspiration is not contributing to cough. Rationale explained to pf and family, who agree with plan.   MBS scheduled and transport on way. D/W RN.   HPI HPI: Nicole Chan is an 82 yr old female who [facility] via EMS on 08/25/2023. She was found down by family. EMS activated code stroke alert for left sided weakness and questionable rt gaze preference. 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 Small acute infarcts within the right insula and right subinsular  white matter.   PMH of CHF, Cardiomyopathy, HTN, pacemaker.      SLP Plan  MBS      Recommendations for follow up therapy are one component of a multi-disciplinary discharge planning process, led by the attending physician.  Recommendations may be updated based on patient status, additional functional criteria and insurance authorization.    Recommendations  Diet recommendations: Dysphagia 1 (puree);Thin liquid Medication Administration: Whole meds with puree Supervision: Full supervision/cueing for compensatory strategies Compensations: Slow rate;Small sips/bites;Monitor for anterior loss;Lingual sweep for clearance of pocketing;Multiple dry swallows after each bite/sip                  Oral care BID     Dysphagia, oropharyngeal phase (R13.12)     MBS    Noe Goyer L. Samson Frederic, MA  CCC/SLP Clinical Specialist - Acute Care SLP Acute Rehabilitation Services Office number [phone]  Nicole Chan  08/27/2023, 2:17 PM

## 2023-08-27 NOTE — TOC CM/SW Note (Signed)
Transition of Care [facility]) - Inpatient Brief Assessment   Patient Details  Name: Nicole Chan MRN: [id number] Date of Birth: [birth year]  Transition of Care [facility]) CM/SW Contact:    Kermit Balo, RN Phone Number: 08/27/2023, 3:12 PM   Clinical Narrative:  CIR following for a rehab admission. Waiting to see about CEA.   Transition of Care Asessment: Insurance and Status: Insurance coverage has been reviewed Patient has primary care physician: Yes Home environment has been reviewed: home with [employer]   Prior/Current Home Services: No current home services Social Determinants of Health Reivew: SDOH reviewed no interventions necessary Readmission risk has been reviewed: Yes Transition of care needs: transition of care needs identified, TOC will continue to follow

## 2023-08-27 NOTE — Progress Notes (Signed)
The patient is experiencing urinary retention, with a bladder scan showing a volume of 480 mls. Despite multiple reminders and encouragement, the patient has been unable to void. Dr. Arlean Hopping was contacted and scheduled bladder scans ordered with intermittent catheterization if the bladder volume exceeds 350 mls

## 2023-08-27 NOTE — Plan of Care (Signed)
  Problem: Education: Goal: Knowledge of disease or condition will improve Outcome: Not Progressing Goal: Knowledge of secondary prevention will improve (MUST DOCUMENT ALL) Outcome: Not Progressing Goal: Knowledge of patient specific risk factors will improve Loraine Leriche N/A or DELETE if not current risk factor) Outcome: Not Progressing   Problem: Ischemic Stroke/TIA Tissue Perfusion: Goal: Complications of ischemic stroke/TIA will be minimized Outcome: Progressing   Problem: Coping: Goal: Will verbalize positive feelings about self Outcome: Not Progressing Goal: Will identify appropriate support needs Outcome: Not Progressing   Problem: Self-Care: Goal: Ability to participate in self-care as condition permits will improve Outcome: Not Progressing Goal: Verbalization of feelings and concerns over difficulty with self-care will improve Outcome: Progressing Goal: Ability to communicate needs accurately will improve Outcome: Progressing   Problem: Nutrition: Goal: Risk of aspiration will decrease Outcome: Progressing Goal: Dietary intake will improve Outcome: Progressing   Problem: Health Behavior/Discharge Planning: Goal: Ability to manage health-related needs will improve Outcome: Progressing   Problem: Clinical Measurements: Goal: Ability to maintain clinical measurements within normal limits will improve Outcome: Progressing Goal: Will remain free from infection Outcome: Progressing Goal: Diagnostic test results will improve Outcome: Progressing Goal: Respiratory complications will improve Outcome: Progressing Goal: Cardiovascular complication will be avoided Outcome: Progressing   Problem: Activity: Goal: Risk for activity intolerance will decrease Outcome: Progressing   Problem: Nutrition: Goal: Adequate nutrition will be maintained Outcome: Progressing   Problem: Coping: Goal: Level of anxiety will decrease Outcome: Progressing   Problem: Pain  Management: Goal: General experience of comfort will improve Outcome: Progressing   Problem: Safety: Goal: Ability to remain free from injury will improve Outcome: Progressing   Problem: Skin Integrity: Goal: Risk for impaired skin integrity will decrease Outcome: Progressing

## 2023-08-27 NOTE — Progress Notes (Signed)
[facility] PROGRESS NOTE    Progress Note  Nicole Chan  UUV:[id number] DOB: April 07, 1941 DOA: 08/25/2023 PCP: Andi Devon, MD   Brief Narrative:   Nicole Chan is an 82 y.o. female past medical history significant for essential hypertension, was of the history was obtained from the chart and son at bedside.  He was found on the floor at bedside at 7:30 AM on the day of admission, last time known to be normal was 10 PM the night before.  She smelled like urine was having dysarthria and right arm weakness with poor mentation.  CTA of the head showed no acute findings extensive chronic small vessel disease.  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  PT evaluated the patient will need inpatient rehab. CT angio of the head and neck showed bilateral 50% stenosis ICA Transthoracic Echo showed an EF of 50%, no wall motion abnormality.   Start patient on ASA 81mg  daily.  Prior to admission she was on no antiplatelet therapy.  Continue aspirin and Plavix daily.  She is a smoker Carotid Doppler showed greater than 50% stenosis of the right ICA and 40 to 50% left ICA No events on telemetry monitoring. Her blood was trending up we will start her back on her Coreg and a very certain She is a current tobacco smoker she has been consulted on quitting. Vascular surgery was consulted, and awaiting neurology recommendations to see if she is a candidate for revascularization.  Incidental subclavian artery thrombosis: Seen on CT head and neck that showed moderate filling defect nonocclusive. Upper extremity ultrasound showed 50 to 75% stenosis of the mid distal subclavian artery and 75 to 99% stenosis in the axillary artery.  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  No underlying edema.   Will need a 57-month follow-up. SPEP and UPEP pending, this is low yield in the setting of normal renal function and unremarkable hemoglobin  Mild hypercalcemia: Resolved with IV fluids.  Leukocytosis: Likely reactive has remained afebrile, now resolved. Tmax 99.3.  Tobacco abuse: She has been counseled family has been informed that she needs to quit smoking.   DVT prophylaxis: lovenox Family Communication:none Status is: Inpatient Remains inpatient appropriate because: Acute CVA    Code Status:     Code Status Orders  (From admission, onward)           Start     Ordered   08/25/23 1315  Full code  Continuous       Question:  By:  Answer:  Other   08/25/23 1315           Code Status History     Date Active Date Inactive Code Status Order ID Comments User Context   07/11/2015 1536 07/12/2015 1216 Full Code [id number]  Marinus Maw, MD Inpatient   10/29/2014 1338 10/29/2014 1912 Full Code [id number]  Lyn Records, MD Inpatient   10/24/2014 1937 10/26/2014 2243 Full Code [id number]  Nahser, Deloris Ping, MD Inpatient         IV Access:   Peripheral IV   Procedures and diagnostic studies:   VAS [place] UPPER EXTREMITY ARTERIAL DUPLEX  Result Date: 08/26/2023  UPPER EXTREMITY DUPLEX STUDY Patient Name:  Nicole Chan  Date of Exam:   08/26/2023 Medical Rec #: [id number]  Accession #:    [id number] Date of Birth: [birth year]        Patient Gender: F Patient Age:   44 years Exam Location:  [facility] Procedure:      VAS [place] UPPER EXTREMITY ARTERIAL DUPLEX Referring Phys: Scheryl Marten XU --------------------------------------------------------------------------------  Indications: Abnormal CT finding (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 History:     Patient has a history of Currently admitted for CVA.  Risk Factors:  Hypertension, hyperlipidemia, current smoker. Other Factors: CHF, ICD, PAD Comparison Study: No previous exams Performing Technologist: Jody Hill  RVT, RDMS  Examination Guidelines: A complete evaluation includes B-mode imaging, spectral Doppler, color Doppler, and power Doppler as needed of all accessible portions of each vessel. Bilateral testing is considered an integral part of a complete examination. Limited examinations for reoccurring indications may be performed as noted.  Right Doppler Findings: +---------------+----------+----------+-------------+--------+ Site           PSV (cm/s)Waveform  Stenosis     Comments +---------------+----------+----------+-------------+--------+ Subclavian Prox176       monophasic                      +---------------+----------+----------+-------------+--------+ Subclavian Mid 296       monophasic>50% stenosis         +---------------+----------+----------+-------------+--------+ Subclavian Dist310       monophasic>50% stenosis         +---------------+----------+----------+-------------+--------+ Axillary       532       monophasic>50% stenosis         +---------------+----------+----------+-------------+--------+ Brachial Prox  116       monophasic                      +---------------+----------+----------+-------------+--------+ Brachial Mid   99        monophasic                      +---------------+----------+----------+-------------+--------+ Brachial Dist  102       monophasic                      +---------------+----------+----------+-------------+--------+ Radial Mid     72        monophasic                      +---------------+----------+----------+-------------+--------+ Radial Dist    58        monophasic                      +---------------+----------+----------+-------------+--------+ Ulnar Mid      37        monophasic>50% stenosis         +---------------+----------+----------+-------------+--------+ Ulnar Dist     35        monophasic>50% stenosis         +---------------+----------+----------+-------------+--------+ Palmar  Arch    28        monophasic                      +---------------+----------+----------+-------------+--------+    Summary:  Right: Post stenotic waveform in proximal subclavian may indicate a        more proximal occlusion. 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 *See table(s) above for measurements and observations. Suggest Peripheral Vascular Consult. Electronically signed by Sherald Hess MD on 08/26/2023 at 1:46:39 PM.    Final  VAS US CAROTID  Result Date: 08/26/2023 Carotid Arterial Duplex Study Patient Name:  Nicole Chan  Date of Exam:   08/26/2023 Medical Rec #: [id number]         Accession #:    [id number] Date of Birth: 1940-11-03        Patient Gender: F Patient Age:   55 years Exam Location:  [facility] Procedure:      VAS US CAROTID Referring Phys: [facility] --------------------------------------------------------------------------------  Indications:   CVA. Risk Factors:  Hypertension, hyperlipidemia, current smoker. Other Factors: CHF, ICD, PAD. Performing Technologist: Ernestene Mention RVT, RDMS  Examination Guidelines: A complete evaluation includes B-mode imaging, spectral Doppler, color Doppler, and power Doppler as needed of all accessible portions of each vessel. Bilateral testing is considered an integral part of a complete examination. Limited examinations for reoccurring indications may be performed as noted.  Right Carotid Findings: +----------+--------+--------+--------+---------------------+------------------+           PSV cm/sEDV cm/sStenosisPlaque Description   Comments           +----------+--------+--------+--------+---------------------+------------------+ CCA Prox  71      11              heterogenous                            +----------+--------+--------+--------+---------------------+------------------+ CCA Distal63      12              heterogenous                             +----------+--------+--------+--------+---------------------+------------------+ ICA Prox  142     24      40-59%  diffuse, irregular   diffuse calcific                                     and calcific         plaque and                                                                shadowing          +----------+--------+--------+--------+---------------------+------------------+ ICA Mid   70      14                                                      +----------+--------+--------+--------+---------------------+------------------+ ICA Distal75      18                                                      +----------+--------+--------+--------+---------------------+------------------+ ECA       241     21      >50%    heterogenous and  calcific                                +----------+--------+--------+--------+---------------------+------------------+ +----------+--------+-------+--------+-------------------+           PSV cm/sEDV cmsDescribeArm Pressure (mmHG) +----------+--------+-------+--------+-------------------+ [id number]            Stenotic                    +----------+--------+-------+--------+-------------------+ +---------+--------+--+--------+-+ VertebralPSV cm/s35EDV cm/s7 +---------+--------+--+--------+-+  Left Carotid Findings: +----------+--------+--------+--------+---------------------+------------------+           PSV cm/sEDV cm/sStenosisPlaque Description   Comments           +----------+--------+--------+--------+---------------------+------------------+ CCA Prox  86      16              heterogenous         intimal thickening +----------+--------+--------+--------+---------------------+------------------+ CCA Distal63      12              heterogenous                             +----------+--------+--------+--------+---------------------+------------------+ ICA Prox  136     26              diffuse, irregular,                                                       calcific and                                                              heterogenous                            +----------+--------+--------+--------+---------------------+------------------+ ICA Mid   122     26                                                      +----------+--------+--------+--------+---------------------+------------------+ ICA Distal94      14                                                      +----------+--------+--------+--------+---------------------+------------------+ ECA       109     0               heterogenous                            +----------+--------+--------+--------+---------------------+------------------+ +----------+--------+--------+--------+-------------------+           PSV cm/sEDV cm/sDescribeArm Pressure (mmHG) +----------+--------+--------+--------+-------------------+ [id number]             Stenotic                    +----------+--------+--------+--------+-------------------+ +---------+--------+--+--------+-+  VertebralPSV cm/s55EDV cm/s8 +---------+--------+--+--------+-+   Summary: Right Carotid: Velocities in the right ICA are consistent with a 40-59%                stenosis. The ECA appears >50% stenosed. Left Carotid: Velocities in the left ICA are consistent with a 40-59% stenosis. Vertebrals:  Bilateral vertebral arteries demonstrate antegrade flow. Subclavians: Bilateral subclavian arteries were stenotic. *See table(s) above for measurements and observations.     Preliminary    DG Abd 2 Views  Result Date: 08/25/2023 CLINICAL DATA:  [id number] Vomiting [id number] EXAM: ABDOMEN - 2 VIEW COMPARISON:  None Available. FINDINGS: The bowel gas pattern is normal. There is no evidence of free air. No radio-opaque  calculi or other significant radiographic abnormality is seen. Excreted contrast within the urinary bladder. IMPRESSION: Negative. Electronically Signed   By: Duanne Guess D.O.   On: 08/25/2023 21:45   DG Pelvis 1-2 Views  Result Date: 08/25/2023 CLINICAL DATA:  Pain EXAM: PELVIS - 1-2 VIEW COMPARISON:  None Available. FINDINGS: There is no evidence of pelvic fracture or diastasis. Hip joints are intact. Excreted contrast within the urinary bladder. IMPRESSION: Negative. Electronically Signed   By: Duanne Guess D.O.   On: 08/25/2023 21:44   ECHOCARDIOGRAM COMPLETE  Result Date: 08/25/2023    ECHOCARDIOGRAM REPORT   Patient Name:   TINESHIA BOSTIAN Date of Exam: 08/25/2023 Medical Rec #:  [id number]        Height:       67.0 in Accession #:    [id number]       Weight:       197.5 lb Date of Birth:  25-Jan-1941       BSA:          2.012 m Patient Age:    82 years         BP:           181/92 mmHg Patient Gender: F                HR:           88 bpm. Exam Location:  Inpatient Procedure: 2D Echo, Cardiac Doppler, Color Doppler and Saline Contrast Bubble            Study Indications:    Stroke  History:        Patient has prior history of Echocardiogram examinations, most                 recent 06/20/2015. CHF, Abnormal ECG and Defibrillator,                 Arrythmias:LBBB, Signs/Symptoms:Dizziness/Lightheadedness; Risk                 Factors:Hypertension, Current Smoker and Dyslipidemia.  Sonographer:    Sheralyn Boatman RDCS Referring Phys: [id number] [facility] GOEL IMPRESSIONS  1. There is apical LV cavity systolic obliteration with a dynamic "gradient" of up to 2 m/s. There is no evidence of LV outflow obstruction and there is no systolic anterior motion of the mitral valve at rest or with the Valsalva maneuver. Left ventricular ejection fraction, by estimation, is 45 to 50%. The left ventricle has mildly decreased function. The left ventricle has no regional wall motion abnormalities. There is moderate  asymmetric left ventricular hypertrophy of the basal-septal and apical segments. Indeterminate diastolic filling due to E-A fusion.  2. Right ventricular systolic function is normal. The right ventricular size is normal.  3. The mitral valve is degenerative. No  evidence of mitral valve regurgitation. No evidence of mitral stenosis. Moderate mitral annular calcification.  4. The aortic valve is tricuspid. There is mild calcification of the aortic valve. There is moderate thickening of the aortic valve. Aortic valve regurgitation is trivial. Aortic valve sclerosis/calcification is present, without any evidence of aortic stenosis. Comparison(s): Prior images unable to be directly viewed, comparison made by report only. The left ventricular function has improved. FINDINGS  Left Ventricle: There is apical LV cavity systolic obliteration with a dynamic "gradient" of up to 2 m/s. There is no evidence of LV outflow obstruction and there is no systolic anterior motion of the mitral valve at rest or with the Valsalva maneuver. Left ventricular ejection fraction, by estimation, is 45 to 50%. The left ventricle has mildly decreased function. The left ventricle has no regional wall motion abnormalities. The left ventricular internal cavity size was small. There is moderate asymmetric left ventricular hypertrophy of the basal-septal and apical segments. Abnormal (paradoxical) septal motion, consistent with left bundle branch block. Indeterminate diastolic filling due to E-A fusion. Right Ventricle: The right ventricular size is normal. No increase in right ventricular wall thickness. Right ventricular systolic function is normal. Left Atrium: Left atrial size was normal in size. Right Atrium: Right atrial size was normal in size. Pericardium: There is no evidence of pericardial effusion. Mitral Valve: The mitral valve is degenerative in appearance. Moderate mitral annular calcification. No evidence of mitral valve regurgitation.  No evidence of mitral valve stenosis. Tricuspid Valve: The tricuspid valve is grossly normal. Tricuspid valve regurgitation is not demonstrated. Aortic Valve: The aortic valve is tricuspid. There is mild calcification of the aortic valve. There is moderate thickening of the aortic valve. Aortic valve regurgitation is trivial. Aortic valve sclerosis/calcification is present, without any evidence of aortic stenosis. Pulmonic Valve: The pulmonic valve was not well visualized. Pulmonic valve regurgitation is not visualized. Aorta: The aortic root is normal in size and structure. IAS/Shunts: No atrial level shunt detected by color flow Doppler. Agitated saline contrast was given intravenously to evaluate for intracardiac shunting. Additional Comments: A device lead is visualized in the right ventricle.  LEFT VENTRICLE PLAX 2D LVIDd:         2.60 cm     Diastology LVIDs:         2.00 cm     LV e' medial:    3.70 cm/s LV PW:         1.75 cm     LV E/e' medial:  30.8 LV IVS:        2.15 cm     LV e' lateral:   4.35 cm/s LVOT diam:     1.90 cm     LV E/e' lateral: 26.2 LV SV:         56 LV SV Index:   28 LVOT Area:     2.84 cm  LV Volumes (MOD) LV vol d, MOD A2C: 62.1 ml LV vol d, MOD A4C: 61.9 ml LV vol s, MOD A2C: 30.0 ml LV vol s, MOD A4C: 27.9 ml LV SV MOD A2C:     32.1 ml LV SV MOD A4C:     61.9 ml LV SV MOD BP:      39.3 ml RIGHT VENTRICLE             IVC RV S prime:     11.40 cm/s  IVC diam: 1.30 cm TAPSE (M-mode): 1.6 cm LEFT ATRIUM  Index        RIGHT ATRIUM          Index LA diam:      1.10 cm 0.55 cm/m   RA Area:     8.61 cm LA Vol (A2C): 17.4 ml 8.65 ml/m   RA Volume:   15.70 ml 7.80 ml/m LA Vol (A4C): 30.2 ml 15.01 ml/m  AORTIC VALVE LVOT Vmax:   116.00 cm/s LVOT Vmean:  77.700 cm/s LVOT VTI:    0.196 m  AORTA Ao Root diam: 3.30 cm MITRAL VALVE MV Area (PHT): 6.27 cm     SHUNTS MV Decel Time: 121 msec     Systemic VTI:  0.20 m MV E velocity: 114.00 cm/s  Systemic Diam: 1.90 cm Rachelle Hora Croitoru MD  Electronically signed by Thurmon Fair MD Signature Date/Time: 08/25/2023/5:20:55 PM    Final    MR BRAIN WO CONTRAST  Result Date: 08/25/2023 CLINICAL DATA:  Provided history: Stroke, follow-up. EXAM: MRI HEAD WITHOUT CONTRAST TECHNIQUE: Multiplanar, multiecho pulse sequences of the brain and surrounding structures were obtained without intravenous contrast. COMPARISON:  Non-contrast head CT and CT angiogram head/neck 08/25/2023. FINDINGS: Brain: No age advanced or lobar predominant parenchymal atrophy. Mild cerebellar atrophy. 1.5 x 2.3 x 2.9 cm acute infarct within the right corona radiata/basal ganglia. Small acute infarcts within the right insula and right subinsular white matter. Chronic lacunar infarcts within bilateral cerebral hemispheric white matter and basal ganglia. Small focus of diffusion-weighted signal hyperintensity at site of a chronic infarct in the left basal ganglia, likely reflecting susceptibility artifact from chronic blood products at this site. Background advanced patchy and confluent T2 FLAIR hyperintense signal abnormality within the cerebral white matter, nonspecific but compatible with chronic small vessel ischemic disease. Small chronic infarcts within the left cerebellar hemisphere. 9 mm extra-axial mass along the right aspect of the anterior falx, likely reflecting a meningioma (for instance as seen on series 11, image 41) (series 15, image 22). Contact upon the underlying right lobe without underlying parenchymal edema. There are a few chronic microhemorrhages scattered within the supratentorial brain. Partially empty sella turcica. No extra-axial fluid collection. No midline shift. Vascular: Maintained flow voids within the proximal large arterial vessels. 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 Skull and upper cervical spine: Indeterminate well-circumscribed 10 mm FLAIR hyperintense and T1 hypointense  lesion within the left frontal calvarium (for instance as seen on series 11, image 44). Sinuses/Orbits: No mass or acute finding within the imaged orbits. Minimal mucosal thickening within the right ethmoid and left maxillary sinuses. IMPRESSION: 1. 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 2. Small acute infarcts within the right insula and right subinsular white matter. 3. Background advanced chronic small ischemic disease with multiple chronic infarcts, as described. 4. 9 mm extra-axial mass along the right aspect of the anterior falx, likely reflecting a meningioma. Contact upon the underlying right frontal lobe without underlying parenchymal edema 5. Mild cerebellar atrophy. 6. Indeterminate 10 mm osseous lesion within the left frontal calvarium. Consider a follow-up brain MRI in 3 months to ensure stability. Electronically Signed   By: Jackey Loge D.O.   On: 08/25/2023 13:59   DG Chest Port 1 View  Result Date: 08/25/2023 CLINICAL DATA:  Weakness status post fall EXAM: PORTABLE CHEST 1 VIEW COMPARISON:  Chest radiograph dated 04/03/2021 FINDINGS: Lines/tubes: Left chest wall ICD leads project over the right atrium and ventricle and tributary of the  coronary sinus. Lungs: Low lung volumes with bronchovascular crowding. No focal consolidation. Pleura: No pneumothorax or pleural effusion. Heart/mediastinum: The heart size and mediastinal contours are within normal limits. Bones: No radiographic finding of acute displaced fracture. IMPRESSION: 1. Low lung volumes with bronchovascular crowding. No focal consolidation. 2.  No radiographic finding of acute displaced fracture. Electronically Signed   By: Agustin Cree M.D.   On: 08/25/2023 10:29     Medical Consultants:   None.   Subjective:    Nicole Chan no complaints today.  Objective:    Vitals:   08/26/23 1946 08/26/23 2348 08/27/23 0423 08/27/23 0813  BP:  (!) 153/110 (!) 163/53 (!) 184/95  Pulse:  77 72  88  Resp:  16 16 16   Temp:  98.2 F (36.8 C) 98.7 F (37.1 C) 99.2 F (37.3 C)  TempSrc:  Oral Oral Oral  SpO2: 94% 95% 96% 94%  Weight:       SpO2: 94 %   Intake/Output Summary (Last 24 hours) at 08/27/2023 0929 Last data filed at 08/27/2023 0357 Gross per 24 hour  Intake 173 ml  Output 1087 ml  Net -914 ml   Filed Weights   08/25/23 0800  Weight: 89.6 kg    Exam: General exam: In no acute distress. Respiratory system: Good air movement and clear to auscultation. Cardiovascular system: S1 & S2 heard, RRR. No JVD. Gastrointestinal system: Abdomen is nondistended, soft and nontender.  Extremities: No pedal edema. Skin: No rashes, lesions or ulcers Psychiatry: Judgement and insight appear normal. Mood & affect appropriate.  Data Reviewed:    Labs: Basic Metabolic Panel: Recent Labs  Lab 08/25/23 0823 08/25/23 0830 08/25/23 1535 08/26/23 0444 08/27/23 0459  NA 138 141  --   --  140  K 3.7 3.7  --   --  3.3*  CL 106 108  --   --  108  CO2 19*  --   --   --  23  GLUCOSE 169* 170*  --   --  114*  BUN 17 17  --   --  18  CREATININE 0.98 0.90 0.81  --  0.90  CALCIUM 10.4*  --   --   --  9.8  PHOS  --   --   --  3.1  --    GFR Estimated Creatinine Clearance: 55.4 mL/min (by C-G formula based on SCr of 0.9 mg/dL). Liver Function Tests: Recent Labs  Lab 08/25/23 0823  AST 27  ALT 23  ALKPHOS 73  BILITOT 1.5*  PROT 6.8  ALBUMIN 3.6   No results for input(s): "LIPASE", "AMYLASE" in the last 168 hours. No results for input(s): "AMMONIA" in the last 168 hours. Coagulation profile Recent Labs  Lab 08/25/23 0823  INR 1.1   COVID-19 Labs  No results for input(s): "DDIMER", "FERRITIN", "LDH", "CRP" in the last 72 hours.  Lab Results  Component Value Date   SARSCOV2NAA Not Detected 04/12/2019    CBC: Recent Labs  Lab 08/25/23 0823 08/25/23 0830 08/25/23 1640  WBC 13.9*  --  12.5*  NEUTROABS 11.6*  --   --   HGB 14.9 15.6* 14.3  HCT 45.3 46.0  42.3  MCV 88.6  --  86.5  PLT 226  --  217   Cardiac Enzymes: Recent Labs  Lab 08/25/23 1535  CKTOTAL 144   BNP (last 3 results) No results for input(s): "PROBNP" in the last 8760 hours. CBG: Recent Labs  Lab 08/25/23 0827 08/25/23 1658  08/26/23 2132  GLUCAP 159* 168* 140*   D-Dimer: No results for input(s): "DDIMER" in the last 72 hours. Hgb A1c: Recent Labs    08/25/23 1640  HGBA1C 5.4   Lipid Profile: Recent Labs    08/26/23 0444  CHOL 203*  HDL 35*  LDLCALC 141*  TRIG 136  CHOLHDL 5.8   Thyroid function studies: No results for input(s): "TSH", "T4TOTAL", "T3FREE", "THYROIDAB" in the last 72 hours.  Invalid input(s): "FREET3" Anemia work up: No results for input(s): "VITAMINB12", "FOLATE", "FERRITIN", "TIBC", "IRON", "RETICCTPCT" in the last 72 hours. Sepsis Labs: Recent Labs  Lab 08/25/23 0823 08/25/23 1640  WBC 13.9* 12.5*   Microbiology No results found for this or any previous visit (from the past 240 hour(s)).   Medications:    aspirin EC  81 mg Oral Daily   atorvastatin  80 mg Oral Daily   clopidogrel  75 mg Oral Daily   enoxaparin (LOVENOX) injection  40 mg Subcutaneous Q24H   mouth rinse  15 mL Mouth Rinse 4 times per day   Continuous Infusions:  sodium chloride 100 mL/hr at 08/26/23 0103      LOS: 2 days   Marinda Elk  [facility]  08/27/2023, 9:29 AM

## 2023-08-27 NOTE — Progress Notes (Signed)
Modified Barium Swallow Study  Patient Details  Name: Nicole Chan MRN: [id number] Date of Birth: 1941/06/17  Today's Date: 08/27/2023  Modified Barium Swallow completed.  Full report located under Chart Review in the Imaging Section.  History of Present Illness Nicole Chan is an 82 yr old female who presented to [facility] via EMS on 08/25/2023. She was found down by family. EMS activated code stroke alert for left sided weakness and questionable rt gaze preference. 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 Small acute infarcts within the right insula and right subinsular  white matter.   PMH of CHF, Cardiomyopathy, HTN, pacemaker.   Clinical Impression Pt presents with a moderate sensorimotor oropharyngeal dysphagia. There was impaired oral control with residue on tongue and left lateral sulcus. Pharyngeal swallow onset occurred at the level of the pyriforms, with spillage into the larynx and trace, silent aspiration of thin liquids.  Nectar thick liquids reached the level of the vocal folds without eliciting a cough response. Laryngeal vestibule closure was adequate but often occurred too late to be fully protective. There was reduced pharyngeal squeeze and base of tongue contact, leading to mild residue primarily in the valleculae, with notable retrograde flow of some residue from lower pharynx into mid-pharynx.  There was partial obstruction of flow through PES and occasional trace backflow observed to squeeze upward through PES.  Technical difficulty prevented adequate screen of the esophagus. A head turn to the left was effective in preventing penetration/aspiration with nectar-thick liquids.    Pt has good potential for recovery of swallow - recommend continuing dysphagia 1 for now; allow nectar thick liquids but pt must take single sips and turn head over left should for each swallow. Meds should be crushed.  Her daughter [facility] was present for study  and we reviewed results and recs; pt/dtr agree with plan. SLP will follow. Factors that may increase risk of adverse event in presence of aspiration [facility] & [facility] 2021):  n/a  Swallow Evaluation Recommendations Recommendations: PO diet PO Diet Recommendation: Dysphagia 1 (Pureed);Mildly thick liquids (Level 2, nectar thick) Liquid Administration via: Spoon;Cup;Straw Medication Administration: Crushed with puree Supervision: Full assist for feeding Swallowing strategies  : Head turn left during swallowing;Slow rate;Small bites/sips Postural changes: Position pt fully upright for meals Oral care recommendations: Oral care BID (2x/day)    Nicole [facility] L. Nicole Frederic, MA CCC/SLP Clinical Specialist - Acute Care SLP Acute Rehabilitation Services Office number [phone]   [facility] 08/27/2023,3:58 PM

## 2023-08-27 NOTE — Progress Notes (Signed)
STROKE TEAM PROGRESS NOTE   SUBJECTIVE (INTERVAL HISTORY) Daughters are at the bedside.  Pt condition improved, more awake alert and interactive, less prominent facial droop, less lethargic. Dr. Chestine Spore offered right CEA next week, family is discussing with PCP.    OBJECTIVE Temp:  [98.2 F (36.8 C)-99.5 F (37.5 C)] 98.7 F (37.1 C) (11/29 1205) Pulse Rate:  [72-88] 77 (11/29 1205) Cardiac Rhythm: Ventricular paced (11/29 0800) Resp:  [16-17] 17 (11/29 1205) BP: (153-184)/(53-110) 157/78 (11/29 1205) SpO2:  [91 %-99 %] 92 % (11/29 1205)  Recent Labs  Lab 08/25/23 0827 08/25/23 1658 08/26/23 2132 08/27/23 1201  GLUCAP 159* 168* 140* 140*   Recent Labs  Lab 08/25/23 0823 08/25/23 0830 08/25/23 1535 08/26/23 0444 08/27/23 0459  NA 138 141  --   --  140  K 3.7 3.7  --   --  3.3*  CL 106 108  --   --  108  CO2 19*  --   --   --  23  GLUCOSE 169* 170*  --   --  114*  BUN 17 17  --   --  18  CREATININE 0.98 0.90 0.81  --  0.90  CALCIUM 10.4*  --   --   --  9.8  PHOS  --   --   --  3.1  --    Recent Labs  Lab 08/25/23 0823  AST 27  ALT 23  ALKPHOS 73  BILITOT 1.5*  PROT 6.8  ALBUMIN 3.6   Recent Labs  Lab 08/25/23 0823 08/25/23 0830 08/25/23 1640  WBC 13.9*  --  12.5*  NEUTROABS 11.6*  --   --   HGB 14.9 15.6* 14.3  HCT 45.3 46.0 42.3  MCV 88.6  --  86.5  PLT 226  --  217   Recent Labs  Lab 08/25/23 1535  CKTOTAL 144   Recent Labs    08/25/23 0823  LABPROT 14.3  INR 1.1   Recent Labs    08/25/23 1035  COLORURINE YELLOW  LABSPEC 1.026  PHURINE 7.0  GLUCOSEU NEGATIVE  HGBUR SMALL*  BILIRUBINUR NEGATIVE  KETONESUR NEGATIVE  PROTEINUR 30*  NITRITE NEGATIVE  LEUKOCYTESUR NEGATIVE       Component Value Date/Time   CHOL 203 (H) 08/26/2023 0444   TRIG 136 08/26/2023 0444   HDL 35 (L) 08/26/2023 0444   CHOLHDL 5.8 08/26/2023 0444   VLDL 27 08/26/2023 0444   LDLCALC 141 (H) 08/26/2023 0444   Lab Results  Component Value Date   HGBA1C  5.4 08/25/2023      Component Value Date/Time   LABOPIA NONE DETECTED 08/25/2023 1035   COCAINSCRNUR NONE DETECTED 08/25/2023 1035   LABBENZ NONE DETECTED 08/25/2023 1035   AMPHETMU NONE DETECTED 08/25/2023 1035   THCU NONE DETECTED 08/25/2023 1035   LABBARB NONE DETECTED 08/25/2023 1035    Recent Labs  Lab 08/25/23 0823  ETH <10    I have personally reviewed the radiological images below and agree with the radiology interpretations.  VAS [place] UPPER EXTREMITY ARTERIAL DUPLEX  Result Date: 08/26/2023  UPPER EXTREMITY DUPLEX STUDY Patient Name:  Nicole Chan  Date of Exam:   08/26/2023 Medical Rec #: [id number]         Accession #:    [id number] Date of Birth: 1940-12-13        Patient Gender: F Patient Age:   58 years Exam Location:  [facility] Procedure:      VAS [place] UPPER EXTREMITY  ARTERIAL DUPLEX Referring Phys: Scheryl Marten Karess Harner --------------------------------------------------------------------------------  Indications: Abnormal CT finding (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 History:     Patient has a history of Currently admitted for CVA.  Risk Factors:  Hypertension, hyperlipidemia, current smoker. Other Factors: CHF, ICD, PAD Comparison Study: No previous exams Performing Technologist: Jody Hill RVT, RDMS  Examination Guidelines: A complete evaluation includes B-mode imaging, spectral Doppler, color Doppler, and power Doppler as needed of all accessible portions of each vessel. Bilateral testing is considered an integral part of a complete examination. Limited examinations for reoccurring indications may be performed as noted.  Right Doppler Findings: +---------------+----------+----------+-------------+--------+ Site           PSV (cm/s)Waveform  Stenosis     Comments +---------------+----------+----------+-------------+--------+ Subclavian Prox176       monophasic                      +---------------+----------+----------+-------------+--------+ Subclavian Mid 296        monophasic>50% stenosis         +---------------+----------+----------+-------------+--------+ Subclavian Dist310       monophasic>50% stenosis         +---------------+----------+----------+-------------+--------+ Axillary       532       monophasic>50% stenosis         +---------------+----------+----------+-------------+--------+ Brachial Prox  116       monophasic                      +---------------+----------+----------+-------------+--------+ Brachial Mid   99        monophasic                      +---------------+----------+----------+-------------+--------+ Brachial Dist  102       monophasic                      +---------------+----------+----------+-------------+--------+ Radial Mid     72        monophasic                      +---------------+----------+----------+-------------+--------+ Radial Dist    58        monophasic                      +---------------+----------+----------+-------------+--------+ Ulnar Mid      37        monophasic>50% stenosis         +---------------+----------+----------+-------------+--------+ Ulnar Dist     35        monophasic>50% stenosis         +---------------+----------+----------+-------------+--------+ Palmar Arch    28        monophasic                      +---------------+----------+----------+-------------+--------+    Summary:  Right: Post stenotic waveform in proximal subclavian may indicate a        more proximal occlusion. 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 *See table(s) above for measurements and observations. Suggest Peripheral Vascular Consult. Electronically signed by Sherald Hess MD on 08/26/2023 at 1:46:39 PM.    Final    VAS US CAROTID  Result Date: 08/26/2023 Carotid Arterial Duplex Study Patient Name:  Nicole Chan  Date of Exam:   08/26/2023 Medical Rec #: [id number]         Accession #:  [id number] Date of Birth: 09-22-1941        Patient Gender: F Patient Age:   49 years Exam Location:  [facility] Procedure:      VAS US CAROTID Referring Phys: [facility] --------------------------------------------------------------------------------  Indications:   CVA. Risk Factors:  Hypertension, hyperlipidemia, current smoker. Other Factors: CHF, ICD, PAD. Performing Technologist: Ernestene Mention RVT, RDMS  Examination Guidelines: A complete evaluation includes B-mode imaging, spectral Doppler, color Doppler, and power Doppler as needed of all accessible portions of each vessel. Bilateral testing is considered an integral part of a complete examination. Limited examinations for reoccurring indications may be performed as noted.  Right Carotid Findings: +----------+--------+--------+--------+---------------------+------------------+           PSV cm/sEDV cm/sStenosisPlaque Description   Comments           +----------+--------+--------+--------+---------------------+------------------+ CCA Prox  71      11              heterogenous                            +----------+--------+--------+--------+---------------------+------------------+ CCA Distal63      12              heterogenous                            +----------+--------+--------+--------+---------------------+------------------+ ICA Prox  142     24      40-59%  diffuse, irregular   diffuse calcific                                     and calcific         plaque and                                                                shadowing          +----------+--------+--------+--------+---------------------+------------------+ ICA Mid   70      14                                                      +----------+--------+--------+--------+---------------------+------------------+ ICA Distal75      18                                                       +----------+--------+--------+--------+---------------------+------------------+ ECA       241     21      >50%    heterogenous and                                                          calcific                                +----------+--------+--------+--------+---------------------+------------------+ +----------+--------+-------+--------+-------------------+  PSV cm/sEDV cmsDescribeArm Pressure (mmHG) +----------+--------+-------+--------+-------------------+ [id number]            Stenotic                    +----------+--------+-------+--------+-------------------+ +---------+--------+--+--------+-+ VertebralPSV cm/s35EDV cm/s7 +---------+--------+--+--------+-+  Left Carotid Findings: +----------+--------+--------+--------+---------------------+------------------+           PSV cm/sEDV cm/sStenosisPlaque Description   Comments           +----------+--------+--------+--------+---------------------+------------------+ CCA Prox  86      16              heterogenous         intimal thickening +----------+--------+--------+--------+---------------------+------------------+ CCA Distal63      12              heterogenous                            +----------+--------+--------+--------+---------------------+------------------+ ICA Prox  136     26              diffuse, irregular,                                                       calcific and                                                              heterogenous                            +----------+--------+--------+--------+---------------------+------------------+ ICA Mid   122     26                                                      +----------+--------+--------+--------+---------------------+------------------+ ICA Distal94      14                                                       +----------+--------+--------+--------+---------------------+------------------+ ECA       109     0               heterogenous                            +----------+--------+--------+--------+---------------------+------------------+ +----------+--------+--------+--------+-------------------+           PSV cm/sEDV cm/sDescribeArm Pressure (mmHG) +----------+--------+--------+--------+-------------------+ [id number]             Stenotic                    +----------+--------+--------+--------+-------------------+ +---------+--------+--+--------+-+ VertebralPSV cm/s55EDV cm/s8 +---------+--------+--+--------+-+   Summary: Right Carotid: Velocities in the right ICA are consistent with a 40-59%  stenosis. The ECA appears >50% stenosed. Left Carotid: Velocities in the left ICA are consistent with a 40-59% stenosis. Vertebrals:  Bilateral vertebral arteries demonstrate antegrade flow. Subclavians: Bilateral subclavian arteries were stenotic. *See table(s) above for measurements and observations.     Preliminary    DG Abd 2 Views  Result Date: 08/25/2023 CLINICAL DATA:  [id number] Vomiting [id number] EXAM: ABDOMEN - 2 VIEW COMPARISON:  None Available. FINDINGS: The bowel gas pattern is normal. There is no evidence of free air. No radio-opaque calculi or other significant radiographic abnormality is seen. Excreted contrast within the urinary bladder. IMPRESSION: Negative. Electronically Signed   By: Duanne Guess D.O.   On: 08/25/2023 21:45   DG Pelvis 1-2 Views  Result Date: 08/25/2023 CLINICAL DATA:  Pain EXAM: PELVIS - 1-2 VIEW COMPARISON:  None Available. FINDINGS: There is no evidence of pelvic fracture or diastasis. Hip joints are intact. Excreted contrast within the urinary bladder. IMPRESSION: Negative. Electronically Signed   By: Duanne Guess D.O.   On: 08/25/2023 21:44   ECHOCARDIOGRAM COMPLETE  Result Date: 08/25/2023    ECHOCARDIOGRAM REPORT   Patient  Name:   Nicole Chan Date of Exam: 08/25/2023 Medical Rec #:  [id number]        Height:       67.0 in Accession #:    [id number]       Weight:       197.5 lb Date of Birth:  04/18/1941       BSA:          2.012 m Patient Age:    82 years         BP:           181/92 mmHg Patient Gender: F                HR:           88 bpm. Exam Location:  Inpatient Procedure: 2D Echo, Cardiac Doppler, Color Doppler and Saline Contrast Bubble            Study Indications:    Stroke  History:        Patient has prior history of Echocardiogram examinations, most                 recent 06/20/2015. CHF, Abnormal ECG and Defibrillator,                 Arrythmias:LBBB, Signs/Symptoms:Dizziness/Lightheadedness; Risk                 Factors:Hypertension, Current Smoker and Dyslipidemia.  Sonographer:    Sheralyn Boatman RDCS Referring Phys: [id number] [facility] GOEL IMPRESSIONS  1. There is apical LV cavity systolic obliteration with a dynamic "gradient" of up to 2 m/s. There is no evidence of LV outflow obstruction and there is no systolic anterior motion of the mitral valve at rest or with the Valsalva maneuver. Left ventricular ejection fraction, by estimation, is 45 to 50%. The left ventricle has mildly decreased function. The left ventricle has no regional wall motion abnormalities. There is moderate asymmetric left ventricular hypertrophy of the basal-septal and apical segments. Indeterminate diastolic filling due to E-A fusion.  2. Right ventricular systolic function is normal. The right ventricular size is normal.  3. The mitral valve is degenerative. No evidence of mitral valve regurgitation. No evidence of mitral stenosis. Moderate mitral annular calcification.  4. The aortic valve is tricuspid. There is mild calcification of the aortic valve. There is moderate thickening of the  aortic valve. Aortic valve regurgitation is trivial. Aortic valve sclerosis/calcification is present, without any evidence of aortic stenosis. Comparison(s): Prior  images unable to be directly viewed, comparison made by report only. The left ventricular function has improved. FINDINGS  Left Ventricle: There is apical LV cavity systolic obliteration with a dynamic "gradient" of up to 2 m/s. There is no evidence of LV outflow obstruction and there is no systolic anterior motion of the mitral valve at rest or with the Valsalva maneuver. Left ventricular ejection fraction, by estimation, is 45 to 50%. The left ventricle has mildly decreased function. The left ventricle has no regional wall motion abnormalities. The left ventricular internal cavity size was small. There is moderate asymmetric left ventricular hypertrophy of the basal-septal and apical segments. Abnormal (paradoxical) septal motion, consistent with left bundle branch block. Indeterminate diastolic filling due to E-A fusion. Right Ventricle: The right ventricular size is normal. No increase in right ventricular wall thickness. Right ventricular systolic function is normal. Left Atrium: Left atrial size was normal in size. Right Atrium: Right atrial size was normal in size. Pericardium: There is no evidence of pericardial effusion. Mitral Valve: The mitral valve is degenerative in appearance. Moderate mitral annular calcification. No evidence of mitral valve regurgitation. No evidence of mitral valve stenosis. Tricuspid Valve: The tricuspid valve is grossly normal. Tricuspid valve regurgitation is not demonstrated. Aortic Valve: The aortic valve is tricuspid. There is mild calcification of the aortic valve. There is moderate thickening of the aortic valve. Aortic valve regurgitation is trivial. Aortic valve sclerosis/calcification is present, without any evidence of aortic stenosis. Pulmonic Valve: The pulmonic valve was not well visualized. Pulmonic valve regurgitation is not visualized. Aorta: The aortic root is normal in size and structure. IAS/Shunts: No atrial level shunt detected by color flow Doppler. Agitated  saline contrast was given intravenously to evaluate for intracardiac shunting. Additional Comments: A device lead is visualized in the right ventricle.  LEFT VENTRICLE PLAX 2D LVIDd:         2.60 cm     Diastology LVIDs:         2.00 cm     LV e' medial:    3.70 cm/s LV PW:         1.75 cm     LV E/e' medial:  30.8 LV IVS:        2.15 cm     LV e' lateral:   4.35 cm/s LVOT diam:     1.90 cm     LV E/e' lateral: 26.2 LV SV:         56 LV SV Index:   28 LVOT Area:     2.84 cm  LV Volumes (MOD) LV vol d, MOD A2C: 62.1 ml LV vol d, MOD A4C: 61.9 ml LV vol s, MOD A2C: 30.0 ml LV vol s, MOD A4C: 27.9 ml LV SV MOD A2C:     32.1 ml LV SV MOD A4C:     61.9 ml LV SV MOD BP:      39.3 ml RIGHT VENTRICLE             IVC RV S prime:     11.40 cm/s  IVC diam: 1.30 cm TAPSE (M-mode): 1.6 cm LEFT ATRIUM           Index        RIGHT ATRIUM          Index LA diam:      1.10 cm 0.55 cm/m  RA Area:     8.61 cm LA Vol (A2C): 17.4 ml 8.65 ml/m   RA Volume:   15.70 ml 7.80 ml/m LA Vol (A4C): 30.2 ml 15.01 ml/m  AORTIC VALVE LVOT Vmax:   116.00 cm/s LVOT Vmean:  77.700 cm/s LVOT VTI:    0.196 m  AORTA Ao Root diam: 3.30 cm MITRAL VALVE MV Area (PHT): 6.27 cm     SHUNTS MV Decel Time: 121 msec     Systemic VTI:  0.20 m MV E velocity: 114.00 cm/s  Systemic Diam: 1.90 cm Rachelle Hora Croitoru MD Electronically signed by Thurmon Fair MD Signature Date/Time: 08/25/2023/5:20:55 PM    Final    MR BRAIN WO CONTRAST  Result Date: 08/25/2023 CLINICAL DATA:  Provided history: Stroke, follow-up. EXAM: MRI HEAD WITHOUT CONTRAST TECHNIQUE: Multiplanar, multiecho pulse sequences of the brain and surrounding structures were obtained without intravenous contrast. COMPARISON:  Non-contrast head CT and CT angiogram head/neck 08/25/2023. FINDINGS: Brain: No age advanced or lobar predominant parenchymal atrophy. Mild cerebellar atrophy. 1.5 x 2.3 x 2.9 cm acute infarct within the right corona radiata/basal ganglia. Small acute infarcts within the right  insula and right subinsular white matter. Chronic lacunar infarcts within bilateral cerebral hemispheric white matter and basal ganglia. Small focus of diffusion-weighted signal hyperintensity at site of a chronic infarct in the left basal ganglia, likely reflecting susceptibility artifact from chronic blood products at this site. Background advanced patchy and confluent T2 FLAIR hyperintense signal abnormality within the cerebral white matter, nonspecific but compatible with chronic small vessel ischemic disease. Small chronic infarcts within the left cerebellar hemisphere. 9 mm extra-axial mass along the right aspect of the anterior falx, likely reflecting a meningioma (for instance as seen on series 11, image 41) (series 15, image 22). Contact upon the underlying right lobe without underlying parenchymal edema. There are a few chronic microhemorrhages scattered within the supratentorial brain. Partially empty sella turcica. No extra-axial fluid collection. No midline shift. Vascular: Maintained flow voids within the proximal large arterial vessels. 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 Skull and upper cervical spine: Indeterminate well-circumscribed 10 mm FLAIR hyperintense and T1 hypointense lesion within the left frontal calvarium (for instance as seen on series 11, image 44). Sinuses/Orbits: No mass or acute finding within the imaged orbits. Minimal mucosal thickening within the right ethmoid and left maxillary sinuses. IMPRESSION: 1. 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 2. Small acute infarcts within the right insula and right subinsular white matter. 3. Background advanced chronic small ischemic disease with multiple chronic infarcts, as described. 4. 9 mm extra-axial mass along the right aspect of the anterior falx, likely reflecting a meningioma. Contact upon the underlying right frontal lobe  without underlying parenchymal edema 5. Mild cerebellar atrophy. 6. Indeterminate 10 mm osseous lesion within the left frontal calvarium. Consider a follow-up brain MRI in 3 months to ensure stability. Electronically Signed   By: Jackey Loge D.O.   On: 08/25/2023 13:59   DG Chest Port 1 View  Result Date: 08/25/2023 CLINICAL DATA:  Weakness status post fall EXAM: PORTABLE CHEST 1 VIEW COMPARISON:  Chest radiograph dated 04/03/2021 FINDINGS: Lines/tubes: Left chest wall ICD leads project over the right atrium and ventricle and tributary of the coronary sinus. Lungs: Low lung volumes with bronchovascular crowding. No focal consolidation. Pleura: No pneumothorax or pleural effusion. Heart/mediastinum: The heart size and mediastinal contours are within normal limits. Bones: No radiographic finding  of acute displaced fracture. IMPRESSION: 1. Low lung volumes with bronchovascular crowding. No focal consolidation. 2.  No radiographic finding of acute displaced fracture. Electronically Signed   By: Agustin Cree M.D.   On: 08/25/2023 10:29   CT ANGIO HEAD NECK W WO CM W PERF (CODE STROKE)  Result Date: 08/25/2023 CLINICAL DATA:  Neuro deficit, acute, stroke suspected EXAM: CT ANGIOGRAPHY HEAD AND NECK CT PERFUSION BRAIN TECHNIQUE: Multidetector CT imaging of the head and neck was performed using the standard protocol during bolus administration of intravenous contrast. Multiplanar CT image reconstructions and MIPs were obtained to evaluate the vascular anatomy. Carotid stenosis measurements (when applicable) are obtained utilizing NASCET criteria, using the distal internal carotid diameter as the denominator. Multiphase CT imaging of the brain was performed following IV bolus contrast injection. Subsequent parametric perfusion maps were calculated using RAPID software. RADIATION DOSE REDUCTION: This exam was performed according to the departmental dose-optimization program which includes automated exposure control,  adjustment of the mA and/or kV according to patient size and/or use of iterative reconstruction technique. CONTRAST:  OMNIPAQUE IOHEXOL 350 MG/ML SOLN COMPARISON:  Same day CT head. FINDINGS: CTA NECK FINDINGS Aortic arch: Moderate stenosis of the right subclavian artery origin. Intraluminal filling defect within the proximal subclavian artery which is suspicious for thrombus. Ulcerated atherosclerosis of the left subclavian artery origin with mild narrowing. Right carotid system: Atherosclerosis at the carotid bifurcation with approximately 30% stenosis of the ICA origin. Left carotid system: Atherosclerosis at the carotid bifurcation involving the proximal ICA with proximally 50% stenosis of the proximal ICA. Vertebral arteries: Severe bilateral vertebral artery origin stenosis. Both vertebral arteries are patent. Left dominant. Skeleton: No acute abnormality on limited assessment. Multilevel degenerative change in the cervical spine. Other neck: Subcentimeter thyroid nodules which do not require imaging follow-up (ref: J Am Coll Radiol. 2015 Feb;12(2): 143-50). Upper chest: Visualized lung apices are clear. Review of the MIP images confirms the above findings CTA HEAD FINDINGS Anterior circulation: Bilateral intracranial ICAs are patent with mild to moderate narrowing bilaterally. 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 Posterior circulation: Bilateral intradural vertebral arteries, basilar artery and bilateral posterior cerebral arteries are are patent. Moderate right proximal P2 PCA stenosis. Venous sinuses: As permitted by contrast timing, patent. Review of the MIP images confirms the above findings CT Brain Perfusion Findings: ASPECTS: 10. CBF (<30%) Volume: 0mL Perfusion (Tmax>6.0s) volume: 0mL Mismatch Volume: 0mL Infarction Location:None identified. IMPRESSION: CTA head: 1. No emergent large vessel occlusion. 2. Moderate right proximal P2  PCA stenosis. 3. 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 CTA neck: 1. Approximately 50% stenosis of the proximal ICAs bilaterally in the neck. 2. Severe bilateral vertebral artery origin stenosis. 3. Moderate atherosclerotic narrowing of the right subclavian artery origin. Intraluminal filling defect within the subclavian artery in this region is suspicious for nonocclusive thrombus versus atherosclerotic plaque extending into the lumen. Right upper extremity ultrasound may be useful to further evaluate and also exclude more distal clot in the arm. 4. Aortic Atherosclerosis (ICD10-I70.0). CT perfusion: No evidence of core infarct or penumbra. Electronically Signed   By: Feliberto Harts M.D.   On: 08/25/2023 09:39   CT HEAD CODE STROKE WO CONTRAST  Result Date: 08/25/2023 CLINICAL DATA:  Code stroke. Neuro deficit with acute stroke suspected facial droop with left arm and leg weakness. EXAM: CT HEAD WITHOUT CONTRAST TECHNIQUE: Contiguous axial images were obtained from the base of the skull  through the vertex without intravenous contrast. RADIATION DOSE REDUCTION: This exam was performed according to the departmental dose-optimization program which includes automated exposure control, adjustment of the mA and/or kV according to patient size and/or use of iterative reconstruction technique. COMPARISON:  None Available. FINDINGS: Brain: No evidence of acute infarction, hemorrhage, hydrocephalus, extra-axial collection or mass lesion/mass effect. Extensive chronic small vessel ischemia with gliosis and lacunar infarcts in the deep gray nuclei. None of these are clearly acute. Small chronic infarct in the left cerebellum. Vascular: Limited at the proximal MCA due to streak artifact. No convincing hyperdensity. Skull: No acute or aggressive finding. Lucency at the superior left frontal bone is likely a incidental hemangioma. Sinuses/Orbits: No acute finding. Other: These results were  communicated to Dr. Wilford Corner at 8:44 am on 08/25/2023 by epic chat. ASPECTS [facility] Stroke Program Early CT Score) - Ganglionic level infarction (caudate, lentiform nuclei, internal capsule, insula, M1-M3 cortex): 7 - Supraganglionic infarction (M4-M6 cortex): 3 Total score (0-10 with 10 being normal): 10 IMPRESSION: 1. No acute finding. 2. Extensive chronic small vessel ischemia. Electronically Signed   By: Tiburcio Pea M.D.   On: 08/25/2023 08:46     PHYSICAL EXAM  Temp:  [98.2 F (36.8 C)-99.5 F (37.5 C)] 98.7 F (37.1 C) (11/29 1205) Pulse Rate:  [72-88] 77 (11/29 1205) Resp:  [16-17] 17 (11/29 1205) BP: (153-184)/(53-110) 157/78 (11/29 1205) SpO2:  [91 %-99 %] 92 % (11/29 1205)  General - Well nourished, well developed, drowsy sleepy.  Ophthalmologic - fundi not visualized due to noncooperation.  Cardiovascular - Regular rhythm and rate.  Neuro - drowsy sleepy and needed constant stimulation to keep eyes open, orientated to age, place, time and people. No aphasia but limited language output, answer questions with words but following all simple commands. No gaze palsy, tracking bilaterally, visual field full. Left facial droop. Tongue protrusion to the left. LUE drift but not touch bed, RUE no drift 4/5. LLE proximal 3-/5 and RLE proximal 3/5, distally 3/5 bilaterally. Sensation symmetrical bilaterally subjectively, b/l FTN no obvious ataxia but L FTN can not complete, gait not tested.     ASSESSMENT/PLAN Nicole Chan is a 82 y.o. female with history of cardiomyopathy and CHF with pacemaker, hypertension, smoker, PAD admitted for found down at home with left-sided weakness, left facial droop and slurred speech. No tPA given due to outside window.    Stroke:  right BG/CR and 2 small insular cortex infarcts, concerning secondary to large vessel disease from right ICA stenosis CT no acute abnormality CT head and neck bilateral ICA proximal 50% stenosis, right P2 severe  stenosis, bilateral VA origin stenosis, right subclavian artery proximal ulcerated plaque MRI right BG/CRinfarct and 2 small insular and subinsular infarcts. 2D Echo EF 40 to 45%, LV cavity systolic obliteration Pacemaker interrogation no afib LDL 141 HgbA1c 5.4 UDS negative Lovenox for VTE prophylaxis No antithrombotic prior to admission, now on aspirin 81 mg daily and clopidogrel 75 mg daily DAPT. Ongoing aggressive stroke risk factor management Therapy recommendations: CIR Disposition: Pending  Possible symptomatic right ICA stenosis CT head and neck bilateral ICA proximal 50% stenosis Carotid Doppler bilateral ICA 40 to 59% stenosis Vascular surgery consulted Dr. Chestine Spore is on board, offered right CEA next week Family will discuss with pt PCP  Cardiomyopathy Status post pacemaker Pacemaker interrogation pending EF 40 to 45% On Coreg and ARB PTA  Hypertension Stable on the high end gradually reach BP goal within 2-3 days. BP goal 130-160 before carotid revascularization  Long term BP goal normotensive  Hyperlipidemia Home meds: Lipitor 20 LDL 141, goal < 70 Now on liver 80 Continue statin at discharge  Tobacco abuse Current smoker Smoking cessation counseling provided Pt is willing to quit  Other Stroke Risk Factors Advanced age Obesity, Body mass index is 30.94 kg/m.  PAD - R axillary artery chronic stenosis on aterial Doppler, right subclavian artery proximal ulcerated plaque  Other Active Problems Leukocytosis WBC 13.9--12.5  Hospital day # 2  Neurology will sign off. Please call with questions. Pt will follow up with stroke clinic NP at [facility] in about 4 weeks. Thanks for the consult.   Marvel Plan, MD PhD Stroke Neurology 08/27/2023 2:21 PM    To contact Stroke Continuity provider, please refer to [email or web address]. After hours, contact General Neurology

## 2023-08-27 NOTE — Progress Notes (Addendum)
[facility] [place]  Subjective  - feels like she is making progress, still weak in the right UE. She answers all my questions appropriately and follows commands.   Objective (!) 163/53 72 98.7 F (37.1 C) (Oral) 16 96%  Intake/Output Summary (Last 24 hours) at 08/27/2023 0705 Last data filed at 08/27/2023 0357 Gross per 24 hour  Intake 173 ml  Output 1087 ml  Net -914 ml   Right Carotid Findings:  +----------+--------+--------+--------+---------------------+--------------  ----+           PSV cm/sEDV cm/sStenosisPlaque Description   Comments             +----------+--------+--------+--------+---------------------+--------------  ----+  CCA Prox  71      11              heterogenous                              +----------+--------+--------+--------+---------------------+--------------  ----+  CCA Distal63      12              heterogenous                              +----------+--------+--------+--------+---------------------+--------------  ----+  ICA Prox  142     24      40-59%  diffuse, irregular   diffuse  calcific                                      and calcific         plaque and                                                                  shadowing            +----------+--------+--------+--------+---------------------+--------------  ----+  ICA Mid   70      14                                                        +----------+--------+--------+--------+---------------------+--------------  ----+  ICA Distal75      18                                                        +----------+--------+--------+--------+---------------------+--------------  ----+  ECA      241     21      >50%    heterogenous and  calcific                                   +----------+--------+--------+--------+---------------------+--------------  ----+   +----------+--------+-------+--------+-------------------+           PSV cm/sEDV cmsDescribeArm Pressure (mmHG)  +----------+--------+-------+--------+-------------------+  [id number]           Stenotic                     +----------+--------+-------+--------+-------------------+   +---------+--------+--+--------+-+  VertebralPSV cm/s35EDV cm/s7  +---------+--------+--+--------+-+      Left Carotid Findings:  +----------+--------+--------+--------+---------------------+--------------  ----+           PSV cm/sEDV cm/sStenosisPlaque Description   Comments             +----------+--------+--------+--------+---------------------+--------------  ----+  CCA Prox  86      16              heterogenous         intimal  thickening  +----------+--------+--------+--------+---------------------+--------------  ----+  CCA Distal63      12              heterogenous                              +----------+--------+--------+--------+---------------------+--------------  ----+  ICA Prox  136     26              diffuse, irregular,                                                         calcific and                                                                heterogenous                              +----------+--------+--------+--------+---------------------+--------------  ----+  ICA Mid   122     26                                                        +----------+--------+--------+--------+---------------------+--------------  ----+  ICA Distal94      14                                                        +----------+--------+--------+--------+---------------------+--------------  ----+  ECA      109     0               heterogenous                               +----------+--------+--------+--------+---------------------+--------------  ----+   +----------+--------+--------+--------+-------------------+  PSV cm/sEDV cm/sDescribeArm Pressure (mmHG)  +----------+--------+--------+--------+-------------------+  [id number]            Stenotic                     +----------+--------+--------+--------+-------------------+   +---------+--------+--+--------+-+  VertebralPSV cm/s55EDV cm/s8  +---------+--------+--+--------+-+   Summary:  Right Carotid: Velocities in the right ICA are consistent with a 40-59%                 stenosis. The ECA appears >50% stenosed.   Left Carotid: Velocities in the left ICA are consistent with a 40-59%  stenosis.   Vertebrals: Bilateral vertebral arteries demonstrate antegrade flow.  Subclavians: Bilateral subclavian arteries were stenotic    Right Doppler Findings:  +---------------+----------+----------+-------------+--------+  Site          PSV (cm/s)Waveform  Stenosis     Comments  +---------------+----------+----------+-------------+--------+  Subclavian Prox176       monophasic                       +---------------+----------+----------+-------------+--------+  Subclavian Mid 296       monophasic>50% stenosis          +---------------+----------+----------+-------------+--------+  Subclavian Dist310       monophasic>50% stenosis          +---------------+----------+----------+-------------+--------+  Axillary      532       monophasic>50% stenosis          +---------------+----------+----------+-------------+--------+  Brachial Prox  116       monophasic                       +---------------+----------+----------+-------------+--------+  Brachial Mid   99        monophasic                       +---------------+----------+----------+-------------+--------+  Brachial Dist  102       monophasic                        +---------------+----------+----------+-------------+--------+  Radial Mid     72        monophasic                       +---------------+----------+----------+-------------+--------+  Radial Dist    58        monophasic                       +---------------+----------+----------+-------------+--------+  Ulnar Mid      37        monophasic>50% stenosis          +---------------+----------+----------+-------------+--------+  Ulnar Dist     35        monophasic>50% stenosis          +---------------+----------+----------+-------------+--------+  Palmar Arch    28        monophasic                       +---------------+----------+----------+-------------+--------+      Summary:    Right: Post stenotic waveform in proximal subclavian may indicate a         more proximal occlusion. 50-74% stenosis in the mid and         distal subclavian artery. 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   FINDINGS: CTA NECK FINDINGS   Aortic arch: Moderate stenosis of the right subclavian artery  origin. Intraluminal filling defect within the proximal subclavian artery which is suspicious for thrombus. Ulcerated atherosclerosis of the left subclavian artery origin with mild narrowing.   Right carotid system: Atherosclerosis at the carotid bifurcation with approximately 30% stenosis of the ICA origin.   Left carotid system: Atherosclerosis at the carotid bifurcation involving the proximal ICA with proximally 50% stenosis of the proximal ICA.  Palpable radial pulses B UE, weakness left UE Moving B LE, mild weakness dysarthria of the left LE compared to the right LE Speech mild garbled but understandable   Assessment/Planning: This is a 82 y.o. female admitted for acute right CVA with left UE weakness > left LE weakness.    Carotid duplex shows Right: Post stenotic waveform in proximal subclavian may indicate a more proximal occlusion. 50-74% stenosis in the mid and  distal subclavian artery. 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   She has a palpable right radial pulse.  We will observe this.           CT angio head/neck demonstrates bilateral ICA stenosis of 50%. MRI brain demonstrates small acute infarcts within the right insula and right subinsular white matter.  She also has several chronic infarcts.    She has some left sided arm and leg weakness. Family reports she has had chronic issues with right leg weakness.  She is working with therapy and feels she is making some progress.  She will likely benefit from right carotid endarterectomy in the near future.  Dr. Dot Been will review duplex and make further recommendation as well as subclavian stenosis findings.    Pending CIR for continued rehab  Mosetta Pigeon 08/27/2023 7:05 AM --  Laboratory Lab Results: Recent Labs    08/25/23 0823 08/25/23 0830 08/25/23 1640  WBC 13.9*  --  12.5*  HGB 14.9 15.6* 14.3  HCT 45.3 46.0 42.3  PLT 226  --  217   BMET Recent Labs    08/25/23 0823 08/25/23 0830 08/25/23 1535 08/27/23 0459  NA 138 141  --  140  K 3.7 3.7  --  3.3*  CL 106 108  --  108  CO2 19*  --   --  23  GLUCOSE 169* 170*  --  114*  BUN 17 17  --  18  CREATININE 0.98 0.90 0.81 0.90  CALCIUM 10.4*  --   --  9.8    COAG Lab Results  Component Value Date   INR 1.1 08/25/2023   INR 1.00 10/29/2014   No results found for: "PTT"  I have seen and evaluated the patient. I agree with the PA note as documented above.  82 year old female that vascular surgery was consulted for symptomatic right carotid stenosis.  CTA shows about 50 to 60% right ICA stenosis.  Her MRI showed right corona radiata, basal ganglia, insula infarct.  I have discussed with neurology with Dr. Roda Shutters who feels her carotid could certainly be a significant contributing factor.  She has other underlying risk factors including small vessel disease.  I have offered the patient and her family a right carotid  endarterectomy early next week for stroke risk reduction.  I do not think she is a good TCAR candidate given calcification and short runway in the common carotid.  I discussed indications for surgery to reduce her future stroke risk.  I discussed risk and benefits including risk of bleeding and 1% perioperative stroke risk.  They had a lot of great questions that I answered.  They will continue to  discuss options through the weekend.  Cephus Shelling, MD [facility] [place] Office: [phone]

## 2023-08-27 NOTE — Evaluation (Signed)
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   Abnormal glucose level 10/01/2020   Colon cancer screening 10/01/2020   Flatulence, eructation and gas pain 10/01/2020   Hyperlipidemia 10/01/2020   Hypertensive heart disease 10/01/2020   Obesity with body mass index 30 or greater 10/01/2020   Osteoarthritis of knee 10/01/2020   Vitamin D deficiency 10/01/2020   Near syncope 10/15/2015   Hypotension 10/15/2015   NICM (nonischemic cardiomyopathy) (HCC) 10/15/2015   Chronic systolic CHF (congestive heart failure) (HCC) 10/15/2015   Tobacco abuse 10/15/2015   Hyperglycemia 10/15/2015   ICD (implantable cardioverter-defibrillator), biventricular, in situ 10/11/2015   Hypertensive heart disease with CHF (HCC) 06/05/2015   Chronic systolic heart failure (HCC) 11/12/2014   Dizziness 11/12/2014   Tobacco use 10/26/2014   Essential hypertension 10/25/2014   LBBB (left bundle branch block)- new 10/24/2014   Past Medical History:  Past Medical History:  Diagnosis Date   Arthritis    "maybe in my hands/fingers; not terrible" (07/12/2015)   Chronic systolic CHF (congestive heart failure) (HCC)    Dilated cardiomyopathy (HCC)    Episode of dizziness, secondary to cardiomyopathy possibly  10/25/2014   Essential hypertension    Hypertensive heart disease    LBBB (left bundle branch block)    a. noted on 10/24/14 admission for dizziness    NICM (nonischemic cardiomyopathy) (HCC)    a. Cath 10/29/14: normal coronary arteries (tortuous, compatible with hypertension). b. s/p [employer] BiV-ICD 06/2015.   Tobacco abuse     Past Surgical History:  Past Surgical History:  Procedure Laterality Date   ABDOMINAL HYSTERECTOMY  1970's   APPENDECTOMY     BI-VENTRICULAR IMPLANTABLE CARDIOVERTER DEFIBRILLATOR  (CRT-D)  07/11/2015   BIV ICD GENERATOR CHANGEOUT N/A 03/15/2020   Procedure: BIV ICD GENERATOR CHANGEOUT;  Surgeon: Marinus Maw, MD;  Location: [facility] INVASIVE CV LAB;  Service: Cardiovascular;  Laterality: N/A;   CARDIAC CATHETERIZATION     EP IMPLANTABLE DEVICE N/A 07/11/2015   Procedure: BiV ICD Insertion CRT-D;  Surgeon: Marinus Maw, MD;  Location: [facility] INVASIVE CV LAB;  Service: Cardiovascular;  Laterality: N/A;   LEFT HEART CATHETERIZATION WITH CORONARY ANGIOGRAM N/A 10/29/2014   Procedure: LEFT HEART CATHETERIZATION WITH CORONARY ANGIOGRAM;  Surgeon: Lesleigh Noe, MD;  Location: [facility] CATH LAB;  Service: Cardiovascular;  Laterality: N/A;   TONSILLECTOMY     TUBAL LIGATION  1970's   HPI:  Nicole Chan is an 82 yr old female who presented [facility] via EMS on 08/25/2023. She was found down by family. EMS activated code stroke alert for left sided weakness and questionable rt gaze preference. 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 Small acute infarcts within the right insula and right subinsular  white matter.   PMH of CHF, Cardiomyopathy, HTN, pacemaker.   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 Pt showed emerging recognition of errors and demonstrated improving  placement of numbers on second trial.  She had difficulty with oral reading and tracking to left margin of page.  Receptive/expressive language WNL. Speech is dysarthric with some hypernasal resonance noted.  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 She scored 19/30 on the SLUMS. SLP will follow while admitted; she is an excellent candidate for >3 hours of therapy/day given level of independence PTA.    SLP Assessment  SLP Recommendation/Assessment: Patient needs continued Speech Lanaguage Pathology Services SLP Visit Diagnosis: Cognitive communication deficit (R41.841)    Recommendations for follow up therapy are one component of a multi-disciplinary discharge planning process, led by the attending physician.  Recommendations may be updated based on patient status, additional functional criteria and insurance authorization.    Follow Up Recommendations  >three hours therapy/day    Assistance Recommended at Discharge   frequent  Functional Status Assessment Patient has had a recent decline in their functional status and demonstrates the ability to make significant improvements in function in a reasonable and predictable amount of time.  Frequency and Duration min 2x/week  2 weeks      SLP Evaluation Cognition  Overall Cognitive Status: Impaired/Different from baseline Arousal/Alertness: Awake/alert Orientation Level: Oriented to person;Oriented to place;Disoriented to time Memory: Impaired Memory Impairment: Storage deficit;Decreased recall of new information Awareness: Impaired Awareness Impairment: Intellectual impairment Problem Solving: Impaired Problem Solving Impairment: Verbal basic Safety/Judgment: Impaired       Comprehension  Auditory Comprehension Overall Auditory Comprehension: Appears within functional limits for tasks assessed Visual Recognition/Discrimination Discrimination: Within Function Limits Reading Comprehension Reading Status: Impaired Paragraph Level: Impaired Functional Environmental (signs, name badge):  (difficulty tracking to left margin) Interfering Components: Attention;Visual scanning    Expression Expression Primary  Mode of Expression: Verbal Verbal Expression Overall Verbal Expression: Appears within functional limits for tasks assessed Written Expression Dominant Hand: Right   Oral / Motor  Oral Motor/Sensory Function Overall Oral Motor/Sensory Function: Moderate impairment Facial ROM: Suspected CN VII (facial) dysfunction;Reduced left Facial Symmetry: Suspected CN VII (facial) dysfunction;Abnormal symmetry left Facial Sensation: Suspected CN V (Trigeminal) dysfunction;Reduced left Lingual Symmetry: Within Functional Limits Motor Speech Overall Motor Speech: Impaired Respiration: Within functional limits Phonation: Normal Resonance: Hypernasality Articulation: Impaired Level of Impairment: Word Intelligibility: Intelligibility reduced Word: 75-100% accurate Phrase: 75-100% accurate Sentence: 75-100% accurate Conversation: 75-100% accurate            [name] [name] 08/27/2023, 3:14 PM Abednego Yeates L. Samson Frederic, MA CCC/SLP Clinical Specialist - Acute Care SLP Acute Rehabilitation Services Office number [phone]

## 2023-08-27 NOTE — Evaluation (Signed)
Occupational Therapy Evaluation Patient Details Name: Nicole Chan MRN: [id number] DOB: [birth year] Today's Date: 08/27/2023   History of Present Illness 82 yo female presents to [facility] on 11/27 with L weakness, code stroke. 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 PMH includes PMH of chronic systolic CHF, dilated cardiomyopathy, HTN, LBBB, and hypertensive heart disease, OA, tobacco abuse, ICD.   Clinical Impression   Nicole Chan was evaluated s/p the above admission list. She lives with family who assist as needed at baseline and pt uses a scooter for community mobility. Upon evaluation the pt was limited by generalized weakness with L weaker than right, impaired cognition and communication, limited activity tolerance and unsteady balance. Overall she needed mod/max A for bed mobility and mod A to stand at EOB for ~5 minutes with RW for BUE support. Pt unable to progress to marching or stepping. Due to the deficits listed below the pt also needs up to total A for LB ADLs and mod A for UB ADLs. Pt will benefit from continued acute OT services and intensive inpatient follow up therapy, >3 hours/day after discharge.        If plan is discharge home, recommend the following: Two people to help with walking and/or transfers;A lot of help with walking and/or transfers;A lot of help with bathing/dressing/bathroom;Two people to help with bathing/dressing/bathroom;Assistance with cooking/housework;Direct supervision/assist for medications management;Direct supervision/assist for financial management;Assist for transportation;Help with stairs or ramp for entrance    Functional Status Assessment  Patient has had a recent  decline in their functional status and demonstrates the ability to make significant improvements in function in a reasonable and predictable amount of time.  Equipment Recommendations  Other (comment) (defer)       Precautions / Restrictions Precautions Precautions: Fall;ICD/Pacemaker Restrictions Weight Bearing Restrictions: No      Mobility Bed Mobility Overal bed mobility: Needs Assistance Bed Mobility: Supine to Sit, Sit to Supine     Supine to sit: Mod assist Sit to supine: Max assist        Transfers Overall transfer level: Needs assistance Equipment used: Rolling walker (2 wheels) Transfers: Sit to/from Stand, Bed to chair/wheelchair/BSC Sit to Stand: Mod assist           General transfer comment: tolerated standing for ~5 minutes      Balance Overall balance assessment: Needs assistance, History of Falls Sitting-balance support: No upper extremity supported Sitting balance-Leahy Scale: Fair     Standing balance support: Bilateral upper extremity supported, During functional activity, Reliant on assistive device for balance Standing balance-Leahy Scale: Poor           ADL either performed or assessed with clinical judgement   ADL Overall ADL's : Needs assistance/impaired Eating/Feeding: Minimal assistance   Grooming: Contact guard assist;Sitting Grooming Details (indicate cue type and reason): CGA for sitting Upper Body Bathing: Moderate assistance;Sitting   Lower Body Bathing: Maximal assistance;Sit to/from stand   Upper Body Dressing : Moderate assistance;Sitting   Lower Body Dressing: Sit to/from stand;Total assistance   Toilet Transfer: Maximal assistance;+2 for physical assistance;+2 for safety/equipment;Stand-pivot;Rolling walker (2 wheels);BSC/3in1   Toileting- Clothing Manipulation and Hygiene: Total assistance       Functional mobility during ADLs: Moderate assistance;Rolling walker (2 wheels) General  ADL Comments: limited by  global weakness, L weaker than R, limited activity tolerance, communication, cognition     Vision Baseline Vision/History: 0 No visual deficits Vision Assessment?: No apparent visual deficits     Perception Perception: Not tested       Praxis Praxis: Not tested       Pertinent Vitals/Pain Pain Assessment Pain Assessment: No/denies pain Pain Intervention(s): Monitored during session     Extremity/Trunk Assessment Upper Extremity Assessment Upper Extremity Assessment: RUE deficits/detail;LUE deficits/detail RUE Deficits / Details: generalized weakness LUE Deficits / Details: weaker proximally. able to hold arm up against gravity for 10 seconds. grip strength is 3+/5. denies paraesthesias. poor coordination LUE Sensation: decreased light touch;decreased proprioception LUE Coordination: decreased fine motor;decreased gross motor   Lower Extremity Assessment Lower Extremity Assessment: Defer to PT evaluation   Cervical / Trunk Assessment Cervical / Trunk Assessment: Kyphotic   Communication Communication Communication: Difficulty communicating thoughts/reduced clarity of speech   Cognition Arousal: Alert Behavior During Therapy: Flat affect Overall Cognitive Status: Impaired/Different from baseline Area of Impairment: Attention, Safety/judgement, Awareness, Problem solving, Following commands                   Current Attention Level: Sustained   Following Commands: Follows one step commands consistently   Awareness: Emergent Problem Solving: Slow processing, Decreased initiation, Difficulty sequencing, Requires verbal cues General Comments: flat affect, not many verbalizations made. Followed most simple 1 step commands with increased time for processing     General Comments  VSS, daughter present and assisting with bath            Home Living Family/patient expects to be discharged to:: Private residence Living Arrangements: Other relatives Available  Help at Discharge: Family Type of Home: House Home Access: Stairs to enter;Ramped entrance Entrance Stairs-Number of Steps: 3   Home Layout: One level     Bathroom Shower/Tub: Tub/shower unit         Home Equipment: Grab bars - tub/shower;Grab bars - toilet;Rolling Walker (2 wheels);Electric scooter          Prior Functioning/Environment Prior Level of Function : Independent/Modified Independent;History of Falls (last six months)             Mobility Comments: scooter for community mobility ADLs Comments: mod I        OT Problem List: Decreased strength;Decreased range of motion;Decreased activity tolerance;Impaired balance (sitting and/or standing);Decreased safety awareness;Decreased knowledge of use of DME or AE;Decreased knowledge of precautions      OT Treatment/Interventions: Self-care/ADL training;Neuromuscular education;DME and/or AE instruction;Therapeutic activities;Balance training;Patient/family education    OT Goals(Current goals can be found in the care plan section) Acute Rehab OT Goals Patient Stated Goal: did not state OT Goal Formulation: With patient Time For Goal Achievement: 09/10/23 Potential to Achieve Goals: Good ADL Goals Pt Will Perform Grooming: with set-up Pt Will Perform Lower Body Dressing: with mod assist;sit to/from stand Pt Will Transfer to Toilet: with mod assist;stand pivot transfer;bedside commode Additional ADL Goal #1: Pt will complete bed mobility with min A as a precursor to ADLs  OT Frequency: Min 1X/week       AM-PAC OT "6 Clicks" Daily Activity     Outcome Measure Help from another person eating meals?: A Little Help from another person taking care of personal grooming?: A Lot Help from another person toileting, which includes using toliet, bedpan, or urinal?: A Lot Help from another person bathing (including washing, rinsing, drying)?: A Lot Help from another person to  put on and taking off regular upper body  clothing?: A Lot Help from another person to put on and taking off regular lower body clothing?: Total 6 Click Score: 12   End of Session Equipment Utilized During Treatment: Rollator (4 wheels);Gait belt Nurse Communication: Mobility status  Activity Tolerance: Patient tolerated treatment well Patient left: in bed;with call bell/phone within reach  OT Visit Diagnosis: Unsteadiness on feet (R26.81);Other abnormalities of gait and mobility (R26.89);Muscle weakness (generalized) (M62.81);Repeated falls (R29.6);History of falling (Z91.81);Hemiplegia and hemiparesis Hemiplegia - Right/Left: Left Hemiplegia - dominant/non-dominant: Non-Dominant                Time: 0272-5366 OT Time Calculation (min): 20 min Charges:  OT General Charges $OT Visit: 1 Visit OT Evaluation $OT Eval Moderate Complexity: 1 Mod  Derenda Mis, OTR/L Acute Rehabilitation Services Office [phone] Secure Chat Communication Preferred   Donia Pounds 08/27/2023, 9:38 AM

## 2023-08-27 NOTE — Progress Notes (Signed)
Inpatient Rehab Coordinator Note:  I spoke with pt's daughter Misty Stanley) over the phone to discuss CIR recommendations and goals/expectations of CIR stay.  We reviewed 3 hrs/day of therapy, physician follow up, and average length of stay 2 weeks (dependent upon progress) with goals of supervision to min assist.  Note plans for further workup and vascular to weigh in on timing on potential CEA.  I will f/u with pt/family on Monday to review timing of potential transition to CIR pending insurance approval and medical workup completion.  Estill Dooms, PT, DPT Admissions Coordinator [phone] 08/27/23  10:20 AM

## 2023-08-27 NOTE — Consult Note (Signed)
Physical Medicine and Rehabilitation Consult Reason for Consult:Rehab Referring Physician: Dr. Radonna Ricker     HPI: Nicole Chan is a 82 y.o. female with past medical history of chronic systolic CHF, dilated cardiomyopathy, essential hypertension, insomnia, left bundle branch block, tobacco abuse who presented after she was found on the ground and noted to have left-sided weakness and slurred speech.  She was seen as a code stroke.  She is not a IV TNK candidate because she is outside the window.  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  She also was found to have 10 mm osseous lesion in the left frontal calvarium, consider follow-up brain MRI in 3 months to ensure stability.  She was seen by vascular surgery and CT angio head/neck showed bilateral ICA stenosis of 50%.  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  Vascular feels she will likely benefit from a right carotid endarterectomy in the near future, potentially next week.  She reports history of chronic right knee pain.  She was seen by physical therapy, SLP, Occupational Therapy and found to have significant functional deficits.  She is on a D1 thin diet.   Lives in a one-story home with ramp.  Family can assist 24/7.  Patient had a walker in her car but family reports she never used it.   Review of Systems  Constitutional:  Negative for chills and fever.  HENT:  Negative for congestion.   Eyes:  Negative for double vision.  Respiratory:  Negative for cough and shortness of breath.   Cardiovascular:  Negative for chest pain.  Gastrointestinal:  Negative for abdominal pain, diarrhea and vomiting.  Genitourinary: Negative.   Musculoskeletal:  Positive for back pain  and joint pain.  Skin:  Negative for rash.  Neurological:  Positive for weakness. Negative for sensory change and headaches.        Past Medical History:  Diagnosis Date   Arthritis      "maybe in my hands/fingers; not terrible" (07/12/2015)   Chronic systolic CHF (congestive heart failure) (HCC)     Dilated cardiomyopathy (HCC)     Episode of dizziness, secondary to cardiomyopathy possibly  10/25/2014   Essential hypertension     Hypertensive heart disease     LBBB (left bundle branch block)      a. noted on 10/24/14 admission for dizziness    NICM (nonischemic cardiomyopathy) (HCC)      a. Cath 10/29/14: normal coronary arteries (tortuous, compatible with hypertension). b. s/p [employer] BiV-ICD 06/2015.   Tobacco abuse               Past Surgical History:  Procedure Laterality Date   ABDOMINAL HYSTERECTOMY   1970's   APPENDECTOMY       BI-VENTRICULAR IMPLANTABLE CARDIOVERTER DEFIBRILLATOR  (CRT-D)   07/11/2015   BIV ICD GENERATOR CHANGEOUT N/A 03/15/2020    Procedure: BIV ICD GENERATOR CHANGEOUT;  Surgeon: Marinus Maw, MD;  Location: [facility] INVASIVE CV LAB;  Service: Cardiovascular;  Laterality: N/A;   CARDIAC CATHETERIZATION       EP IMPLANTABLE DEVICE N/A 07/11/2015    Procedure: BiV ICD Insertion CRT-D;  Surgeon: Marinus Maw, MD;  Location: [facility] INVASIVE CV  LAB;  Service: Cardiovascular;  Laterality: N/A;   LEFT HEART CATHETERIZATION WITH CORONARY ANGIOGRAM N/A 10/29/2014    Procedure: LEFT HEART CATHETERIZATION WITH CORONARY ANGIOGRAM;  Surgeon: Lesleigh Noe, MD;  Location: [facility] CATH LAB;  Service: Cardiovascular;  Laterality: N/A;   TONSILLECTOMY       TUBAL LIGATION   1970's             Family History  Problem Relation Age of Onset   Arrhythmia Mother     Hypertension Mother     Diabetes Mother     Heart disease Mother     Cancer Father     Lung cancer Sister     Healthy Paternal Aunt     Heart attack Neg Hx     Stroke Neg Hx          Social History:   reports that she has been smoking cigarettes. She has a 27.5 pack-year smoking history. She has never used smokeless tobacco. She reports that she does not drink alcohol and does not use drugs. Allergies:  Allergies      Allergies  Allergen Reactions   Shellfish Allergy Itching            Medications Prior to Admission  Medication Sig Dispense Refill   atorvastatin (LIPITOR) 20 MG tablet Take 20 mg by mouth daily.       carvedilol (COREG) 12.5 MG tablet Take 1 tablet (12.5 mg total) by mouth 2 (two) times daily with a meal. 180 tablet 2   irbesartan (AVAPRO) 300 MG tablet Take 1 tablet (300 mg total) by mouth daily. 30 tablet 0   Vitamin D, Ergocalciferol, (DRISDOL) 50000 UNITS CAPS capsule Take 50,000 Units by mouth 2 (two) times a week.               Home: Home Living Family/patient expects to be discharged to:: Private residence Living Arrangements: Other relatives Available Help at Discharge: Family Type of Home: House Home Access: Stairs to enter, Ship broker of Steps: 3 Home Layout: One level Bathroom Shower/Tub: Tub/shower unit Home Equipment: Grab bars - tub/shower, Grab bars - toilet, Agricultural consultant (2 wheels), Engineer, site History: Prior Function Prior Level of Function : Independent/Modified Independent, History of Falls (last six months) Mobility Comments: scooter for community mobility ADLs Comments: mod I Functional Status:  Mobility: Bed Mobility Overal bed mobility: Needs Assistance Bed Mobility: Supine to Sit, Sit to Supine Supine to sit: Mod assist Sit to supine: Max assist General bed mobility comments: trunk and le management Transfers Overall transfer level: Needs assistance Equipment used: Rolling walker (2 wheels) Transfers: Sit to/from Stand, Bed to chair/wheelchair/BSC Sit to Stand: Mod assist Bed to/from chair/wheelchair/BSC transfer type:: Step pivot Step pivot transfers: Mod assist General  transfer comment: tolerated standing for ~5 minutes Ambulation/Gait General Gait Details: unable   ADL: ADL Overall ADL's : Needs assistance/impaired Eating/Feeding: Minimal assistance Grooming: Contact guard assist, Sitting Grooming Details (indicate cue type and reason): CGA for sitting Upper Body Bathing: Moderate assistance, Sitting Lower Body Bathing: Maximal assistance, Sit to/from stand Upper Body Dressing : Moderate assistance, Sitting Lower Body Dressing: Sit to/from stand, Total assistance Toilet Transfer: Maximal assistance, +2 for physical assistance, +2 for safety/equipment, Stand-pivot, Rolling walker (2 wheels), BSC/3in1 Toileting- Clothing Manipulation and Hygiene: Total assistance Functional mobility during ADLs: Moderate assistance, Rolling walker (2 wheels) General ADL Comments: limited by global weakness, L weaker than R, limited activity tolerance, communication, cognition   Cognition: Cognition  Overall Cognitive Status: Impaired/Different from baseline Orientation Level: Oriented X4 Cognition Arousal: Alert Behavior During Therapy: Flat affect Overall Cognitive Status: Impaired/Different from baseline Area of Impairment: Attention, Safety/judgement, Awareness, Problem solving, Following commands Current Attention Level: Sustained Following Commands: Follows one step commands consistently Awareness: Emergent Problem Solving: Slow processing, Decreased initiation, Difficulty sequencing, Requires verbal cues General Comments: flat affect, not many verbalizations made. Followed most simple 1 step commands with increased time for processing Difficult to assess due to: Level of arousal   Blood pressure (!) 157/78, pulse 77, temperature 98.7 F (37.1 C), temperature source Oral, resp. rate 17, weight 89.6 kg, SpO2 92%. Physical Exam   General: No apparent distress HEENT: Head is normocephalic, atraumatic, sclera anicteric, oral mucosa pink and moist Neck:  Supple  Heart: RRR Chest: CTA bilaterally without wheezes, rales, or rhonchi; no distress Abdomen: Soft, non-tender, non-distended, bowel sounds positive. Extremities: No clubbing, cyanosis, or edema.  Psych: Pt's affect is flat Skin: Clean and intact without signs of breakdown Neuro:     Mental Status: AAOx3 Speech/Languate: Naming and repetition intact, follows simple commands, + dysarthria, limited verbal output only answers with one-word answers CRANIAL NERVES: II: PERRL. Visual fields full III, IV, VI: EOM intact V: normal sensation bilaterally VII: L facial droop VIII: normal hearing to speech IX, X: normal palatal elevation XI: 4/5 head turn and 4/5 shoulder shrug bilaterally XII: Tongue protrudes to the left     MOTOR: RUE: 4 out of 5 LUE: 4- out of 5 RLE: Hip flexion 3 out of 5, knee extension 4- out of 5, ankle PF and DF 3 out of 5 LLE: Hip flexion 2-3 out of 5, knee extension 4- out of 5, ankle PF and DF 3 out of 5   SENSORY: Normal to touch all 4 extremities   Coordination: Finger-nose slower on the left   MSK: No significant knee tenderness, swelling or pain with ROM noted   Lab Results Last 24 Hours       Results for orders placed or performed during the hospital encounter of 08/25/23 (from the past 24 hour(s))  Glucose, capillary     Status: Abnormal    Collection Time: 08/26/23  9:32 PM  Result Value Ref Range    Glucose-Capillary 140 (H) 70 - 99 mg/dL  Basic metabolic panel     Status: Abnormal    Collection Time: 08/27/23  4:59 AM  Result Value Ref Range    Sodium 140 135 - 145 mmol/L    Potassium 3.3 (L) 3.5 - 5.1 mmol/L    Chloride 108 98 - 111 mmol/L    CO2 23 22 - 32 mmol/L    Glucose, Bld 114 (H) 70 - 99 mg/dL    BUN 18 8 - 23 mg/dL    Creatinine, Ser 1.91 0.44 - 1.00 mg/dL    Calcium 9.8 8.9 - 47.8 mg/dL    GFR, Estimated >29 >56 mL/min    Anion gap 9 5 - 15  Glucose, capillary     Status: Abnormal    Collection Time: 08/27/23 12:01 PM   Result Value Ref Range    Glucose-Capillary 140 (H) 70 - 99 mg/dL    Comment 1 Notify RN         Imaging Results (Last 48 hours)  VAS [place] UPPER EXTREMITY ARTERIAL DUPLEX   Result Date: 08/26/2023  UPPER EXTREMITY DUPLEX STUDY Patient Name:  LEOLA UPDEGROVE  Date of Exam:   08/26/2023 Medical Rec #: [id number]  Accession #:    [id number] Date of Birth: [birth year]        Patient Gender: F Patient Age:   55 years Exam Location:  [facility] Procedure:      VAS [place] UPPER EXTREMITY ARTERIAL DUPLEX Referring Phys: Scheryl Marten XU --------------------------------------------------------------------------------  Indications: Abnormal CT finding (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 History:     Patient has a history of Currently admitted for CVA.  Risk Factors:  Hypertension, hyperlipidemia, current smoker. Other Factors: CHF, ICD, PAD Comparison Study: No previous exams Performing Technologist: Jody Hill RVT, RDMS  Examination Guidelines: A complete evaluation includes B-mode imaging, spectral Doppler, color Doppler, and power Doppler as needed of all accessible portions of each vessel. Bilateral testing is considered an integral part of a complete examination. Limited examinations for reoccurring indications may be performed as noted.  Right Doppler Findings: +---------------+----------+----------+-------------+--------+ Site           PSV (cm/s)Waveform  Stenosis     Comments +---------------+----------+----------+-------------+--------+ Subclavian Prox176       monophasic                      +---------------+----------+----------+-------------+--------+ Subclavian Mid 296       monophasic>50% stenosis         +---------------+----------+----------+-------------+--------+ Subclavian Dist310       monophasic>50% stenosis         +---------------+----------+----------+-------------+--------+ Axillary       532       monophasic>50% stenosis          +---------------+----------+----------+-------------+--------+ Brachial Prox  116       monophasic                      +---------------+----------+----------+-------------+--------+ Brachial Mid   99        monophasic                      +---------------+----------+----------+-------------+--------+ Brachial Dist  102       monophasic                      +---------------+----------+----------+-------------+--------+ Radial Mid     72        monophasic                      +---------------+----------+----------+-------------+--------+ Radial Dist    58        monophasic                      +---------------+----------+----------+-------------+--------+ Ulnar Mid      37        monophasic>50% stenosis         +---------------+----------+----------+-------------+--------+ Ulnar Dist     35        monophasic>50% stenosis         +---------------+----------+----------+-------------+--------+ Palmar Arch    28        monophasic                      +---------------+----------+----------+-------------+--------+    Summary:  Right: Post stenotic waveform in proximal subclavian may indicate a        more proximal occlusion. 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 *See table(s) above for measurements and observations. Suggest Peripheral Vascular Consult. Electronically signed by Sherald Hess MD on 08/26/2023 at 1:46:39 PM.    Final  VAS US CAROTID   Result Date: 08/26/2023 Carotid Arterial Duplex Study Patient Name:  DANELI MUSACCHIO  Date of Exam:   08/26/2023 Medical Rec #: [id number]         Accession #:    [id number] Date of Birth: 05-Oct-1940        Patient Gender: F Patient Age:   9 years Exam Location:  [facility] Procedure:      VAS US CAROTID Referring Phys: [facility] --------------------------------------------------------------------------------  Indications:   CVA. Risk Factors:   Hypertension, hyperlipidemia, current smoker. Other Factors: CHF, ICD, PAD. Performing Technologist: Ernestene Mention RVT, RDMS  Examination Guidelines: A complete evaluation includes B-mode imaging, spectral Doppler, color Doppler, and power Doppler as needed of all accessible portions of each vessel. Bilateral testing is considered an integral part of a complete examination. Limited examinations for reoccurring indications may be performed as noted.  Right Carotid Findings: +----------+--------+--------+--------+---------------------+------------------+           PSV cm/sEDV cm/sStenosisPlaque Description   Comments           +----------+--------+--------+--------+---------------------+------------------+ CCA Prox  71      11              heterogenous                            +----------+--------+--------+--------+---------------------+------------------+ CCA Distal63      12              heterogenous                            +----------+--------+--------+--------+---------------------+------------------+ ICA Prox  142     24      40-59%  diffuse, irregular   diffuse calcific                                     and calcific         plaque and                                                                shadowing          +----------+--------+--------+--------+---------------------+------------------+ ICA Mid   70      14                                                      +----------+--------+--------+--------+---------------------+------------------+ ICA Distal75      18                                                      +----------+--------+--------+--------+---------------------+------------------+ ECA       241     21      >50%    heterogenous and  calcific                                +----------+--------+--------+--------+---------------------+------------------+  +----------+--------+-------+--------+-------------------+           PSV cm/sEDV cmsDescribeArm Pressure (mmHG) +----------+--------+-------+--------+-------------------+ [id number]            Stenotic                    +----------+--------+-------+--------+-------------------+ +---------+--------+--+--------+-+ VertebralPSV cm/s35EDV cm/s7 +---------+--------+--+--------+-+  Left Carotid Findings: +----------+--------+--------+--------+---------------------+------------------+           PSV cm/sEDV cm/sStenosisPlaque Description   Comments           +----------+--------+--------+--------+---------------------+------------------+ CCA Prox  86      16              heterogenous         intimal thickening +----------+--------+--------+--------+---------------------+------------------+ CCA Distal63      12              heterogenous                            +----------+--------+--------+--------+---------------------+------------------+ ICA Prox  136     26              diffuse, irregular,                                                       calcific and                                                              heterogenous                            +----------+--------+--------+--------+---------------------+------------------+ ICA Mid   122     26                                                      +----------+--------+--------+--------+---------------------+------------------+ ICA Distal94      14                                                      +----------+--------+--------+--------+---------------------+------------------+ ECA       109     0               heterogenous                            +----------+--------+--------+--------+---------------------+------------------+ +----------+--------+--------+--------+-------------------+           PSV cm/sEDV cm/sDescribeArm Pressure (mmHG)  +----------+--------+--------+--------+-------------------+ [id number]             Stenotic                    +----------+--------+--------+--------+-------------------+ +---------+--------+--+--------+-+  VertebralPSV cm/s55EDV cm/s8 +---------+--------+--+--------+-+   Summary: Right Carotid: Velocities in the right ICA are consistent with a 40-59%                stenosis. The ECA appears >50% stenosed. Left Carotid: Velocities in the left ICA are consistent with a 40-59% stenosis. Vertebrals:  Bilateral vertebral arteries demonstrate antegrade flow. Subclavians: Bilateral subclavian arteries were stenotic. *See table(s) above for measurements and observations.     Preliminary     DG Abd 2 Views   Result Date: 08/25/2023 CLINICAL DATA:  [id number] Vomiting [id number] EXAM: ABDOMEN - 2 VIEW COMPARISON:  None Available. FINDINGS: The bowel gas pattern is normal. There is no evidence of free air. No radio-opaque calculi or other significant radiographic abnormality is seen. Excreted contrast within the urinary bladder. IMPRESSION: Negative. Electronically Signed   By: Duanne Guess D.O.   On: 08/25/2023 21:45    DG Pelvis 1-2 Views   Result Date: 08/25/2023 CLINICAL DATA:  Pain EXAM: PELVIS - 1-2 VIEW COMPARISON:  None Available. FINDINGS: There is no evidence of pelvic fracture or diastasis. Hip joints are intact. Excreted contrast within the urinary bladder. IMPRESSION: Negative. Electronically Signed   By: Duanne Guess D.O.   On: 08/25/2023 21:44    ECHOCARDIOGRAM COMPLETE   Result Date: 08/25/2023    ECHOCARDIOGRAM REPORT   Patient Name:   HEYLI BENISH Date of Exam: 08/25/2023 Medical Rec #:  [id number]        Height:       67.0 in Accession #:    [id number]       Weight:       197.5 lb Date of Birth:  1940-11-03       BSA:          2.012 m Patient Age:    82 years         BP:           181/92 mmHg Patient Gender: F                HR:           88 bpm. Exam Location:  Inpatient  Procedure: 2D Echo, Cardiac Doppler, Color Doppler and Saline Contrast Bubble            Study Indications:    Stroke  History:        Patient has prior history of Echocardiogram examinations, most                 recent 06/20/2015. CHF, Abnormal ECG and Defibrillator,                 Arrythmias:LBBB, Signs/Symptoms:Dizziness/Lightheadedness; Risk                 Factors:Hypertension, Current Smoker and Dyslipidemia.  Sonographer:    Sheralyn Boatman RDCS Referring Phys: [id number] [facility] GOEL IMPRESSIONS  1. There is apical LV cavity systolic obliteration with a dynamic "gradient" of up to 2 m/s. There is no evidence of LV outflow obstruction and there is no systolic anterior motion of the mitral valve at rest or with the Valsalva maneuver. Left ventricular ejection fraction, by estimation, is 45 to 50%. The left ventricle has mildly decreased function. The left ventricle has no regional wall motion abnormalities. There is moderate asymmetric left ventricular hypertrophy of the basal-septal and apical segments. Indeterminate diastolic filling due to E-A fusion.  2. Right ventricular systolic function is normal. The right ventricular size is normal.  3. The  mitral valve is degenerative. No evidence of mitral valve regurgitation. No evidence of mitral stenosis. Moderate mitral annular calcification.  4. The aortic valve is tricuspid. There is mild calcification of the aortic valve. There is moderate thickening of the aortic valve. Aortic valve regurgitation is trivial. Aortic valve sclerosis/calcification is present, without any evidence of aortic stenosis. Comparison(s): Prior images unable to be directly viewed, comparison made by report only. The left ventricular function has improved. FINDINGS  Left Ventricle: There is apical LV cavity systolic obliteration with a dynamic "gradient" of up to 2 m/s. There is no evidence of LV outflow obstruction and there is no systolic anterior motion of the mitral valve at rest or with the  Valsalva maneuver. Left ventricular ejection fraction, by estimation, is 45 to 50%. The left ventricle has mildly decreased function. The left ventricle has no regional wall motion abnormalities. The left ventricular internal cavity size was small. There is moderate asymmetric left ventricular hypertrophy of the basal-septal and apical segments. Abnormal (paradoxical) septal motion, consistent with left bundle branch block. Indeterminate diastolic filling due to E-A fusion. Right Ventricle: The right ventricular size is normal. No increase in right ventricular wall thickness. Right ventricular systolic function is normal. Left Atrium: Left atrial size was normal in size. Right Atrium: Right atrial size was normal in size. Pericardium: There is no evidence of pericardial effusion. Mitral Valve: The mitral valve is degenerative in appearance. Moderate mitral annular calcification. No evidence of mitral valve regurgitation. No evidence of mitral valve stenosis. Tricuspid Valve: The tricuspid valve is grossly normal. Tricuspid valve regurgitation is not demonstrated. Aortic Valve: The aortic valve is tricuspid. There is mild calcification of the aortic valve. There is moderate thickening of the aortic valve. Aortic valve regurgitation is trivial. Aortic valve sclerosis/calcification is present, without any evidence of aortic stenosis. Pulmonic Valve: The pulmonic valve was not well visualized. Pulmonic valve regurgitation is not visualized. Aorta: The aortic root is normal in size and structure. IAS/Shunts: No atrial level shunt detected by color flow Doppler. Agitated saline contrast was given intravenously to evaluate for intracardiac shunting. Additional Comments: A device lead is visualized in the right ventricle.  LEFT VENTRICLE PLAX 2D LVIDd:         2.60 cm     Diastology LVIDs:         2.00 cm     LV e' medial:    3.70 cm/s LV PW:         1.75 cm     LV E/e' medial:  30.8 LV IVS:        2.15 cm     LV e'  lateral:   4.35 cm/s LVOT diam:     1.90 cm     LV E/e' lateral: 26.2 LV SV:         56 LV SV Index:   28 LVOT Area:     2.84 cm  LV Volumes (MOD) LV vol d, MOD A2C: 62.1 ml LV vol d, MOD A4C: 61.9 ml LV vol s, MOD A2C: 30.0 ml LV vol s, MOD A4C: 27.9 ml LV SV MOD A2C:     32.1 ml LV SV MOD A4C:     61.9 ml LV SV MOD BP:      39.3 ml RIGHT VENTRICLE             IVC RV S prime:     11.40 cm/s  IVC diam: 1.30 cm TAPSE (M-mode): 1.6 cm LEFT ATRIUM  Index        RIGHT ATRIUM          Index LA diam:      1.10 cm 0.55 cm/m   RA Area:     8.61 cm LA Vol (A2C): 17.4 ml 8.65 ml/m   RA Volume:   15.70 ml 7.80 ml/m LA Vol (A4C): 30.2 ml 15.01 ml/m  AORTIC VALVE LVOT Vmax:   116.00 cm/s LVOT Vmean:  77.700 cm/s LVOT VTI:    0.196 m  AORTA Ao Root diam: 3.30 cm MITRAL VALVE MV Area (PHT): 6.27 cm     SHUNTS MV Decel Time: 121 msec     Systemic VTI:  0.20 m MV E velocity: 114.00 cm/s  Systemic Diam: 1.90 cm Rachelle Hora Croitoru MD Electronically signed by Thurmon Fair MD Signature Date/Time: 08/25/2023/5:20:55 PM    Final     MR BRAIN WO CONTRAST   Result Date: 08/25/2023 CLINICAL DATA:  Provided history: Stroke, follow-up. EXAM: MRI HEAD WITHOUT CONTRAST TECHNIQUE: Multiplanar, multiecho pulse sequences of the brain and surrounding structures were obtained without intravenous contrast. COMPARISON:  Non-contrast head CT and CT angiogram head/neck 08/25/2023. FINDINGS: Brain: No age advanced or lobar predominant parenchymal atrophy. Mild cerebellar atrophy. 1.5 x 2.3 x 2.9 cm acute infarct within the right corona radiata/basal ganglia. Small acute infarcts within the right insula and right subinsular white matter. Chronic lacunar infarcts within bilateral cerebral hemispheric white matter and basal ganglia. Small focus of diffusion-weighted signal hyperintensity at site of a chronic infarct in the left basal ganglia, likely reflecting susceptibility artifact from chronic blood products at this site. Background  advanced patchy and confluent T2 FLAIR hyperintense signal abnormality within the cerebral white matter, nonspecific but compatible with chronic small vessel ischemic disease. Small chronic infarcts within the left cerebellar hemisphere. 9 mm extra-axial mass along the right aspect of the anterior falx, likely reflecting a meningioma (for instance as seen on series 11, image 41) (series 15, image 22). Contact upon the underlying right lobe without underlying parenchymal edema. There are a few chronic microhemorrhages scattered within the supratentorial brain. Partially empty sella turcica. No extra-axial fluid collection. No midline shift. Vascular: Maintained flow voids within the proximal large arterial vessels. 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 Skull and upper cervical spine: Indeterminate well-circumscribed 10 mm FLAIR hyperintense and T1 hypointense lesion within the left frontal calvarium (for instance as seen on series 11, image 44). Sinuses/Orbits: No mass or acute finding within the imaged orbits. Minimal mucosal thickening within the right ethmoid and left maxillary sinuses. IMPRESSION: 1. 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 2. Small acute infarcts within the right insula and right subinsular white matter. 3. Background advanced chronic small ischemic disease with multiple chronic infarcts, as described. 4. 9 mm extra-axial mass along the right aspect of the anterior falx, likely reflecting a meningioma. Contact upon the underlying right frontal lobe without underlying parenchymal edema 5. Mild cerebellar atrophy. 6. Indeterminate 10 mm osseous lesion within the left frontal calvarium. Consider a follow-up brain MRI in 3 months to ensure stability. Electronically Signed   By: Jackey Loge D.O.   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 Yes Co-Morbidities requiring supervision/potential complications:  -Cardiomyopathy with systolic HF, HTN, HLD, Tobacco abuse, Leukocytosis , hypokalemia , dysphagia, obesity Due to bladder management, bowel management, safety, skin/wound care, disease management, medication administration, pain management, and patient education, does the patient require 24 hr/day rehab nursing? Yes Does the patient require coordinated care of a physician, rehab nurse, therapy disciplines of PT/OT/SLP to address physical and functional deficits in the context of the above medical diagnosis(es)? Yes Addressing deficits in the following areas: balance, endurance, locomotion, strength, transferring, bowel/bladder control, bathing, dressing, feeding, grooming, toileting, cognition, speech, language, swallowing, and psychosocial support Can the patient actively participate in an intensive therapy program of at least 3 hrs of therapy per day at least 5 days per week? Yes The potential for patient to make measurable gains while on inpatient rehab is excellent Anticipated functional outcomes upon discharge from inpatient rehab are supervision  with PT, supervision with OT, supervision with SLP. Estimated rehab length of stay to reach the above functional goals is: 7-10 days Anticipated discharge destination: Home Overall Rehab/Functional Prognosis: excellent   POST ACUTE RECOMMENDATIONS: This patient's condition is appropriate for continued rehabilitative care in the following setting: CIR Patient has agreed to participate in recommended program. Yes Note that insurance prior authorization may be required for reimbursement for recommended care.   Comment: Good  candidate for CIR pending medical stability, Rehab coordinator to follow up.  Will need to follow on timing for potential right carotid endarterectomy.     I have personally performed a face to face diagnostic evaluation of this patient. Additionally, I have examined the patient's medical record including any pertinent labs and radiographic images. If the physician assistant has documented in this note, I have reviewed and edited or otherwise concur with the physician assistant's documentation.   Thanks,   Fanny Dance, MD 08/27/2023

## 2023-08-27 NOTE — Progress Notes (Deleted)
Physical Medicine and Rehabilitation Consult Reason for Consult:Rehab Referring Physician: Dr. Radonna Ricker   HPI: Nicole Chan is a 82 y.o. female with past medical history of chronic systolic CHF, dilated cardiomyopathy, essential hypertension, insomnia, left bundle branch block, tobacco abuse who presented after she was found on the ground and noted to have left-sided weakness and slurred speech.  She was seen as a code stroke.  She is not a IV TNK candidate because she is outside the window.  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  She also was found to have 10 mm osseous lesion in the left frontal calvarium, consider follow-up brain MRI in 3 months to ensure stability.  She was seen by vascular surgery and CT angio head/neck showed bilateral ICA stenosis of 50%.  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  Vascular feels she will likely benefit from a right carotid endarterectomy in the near future, potentially next week.  She reports history of chronic right knee pain.  She was seen by physical therapy, SLP, Occupational Therapy and found to have significant functional deficits.  She is on a D1 thin diet.  Lives in a one-story home with ramp.  Family can assist 24/7.  Patient had a walker in her car but family reports she never used it.  Review of Systems  Constitutional:  Negative for chills and fever.  HENT:  Negative for congestion.   Eyes:  Negative for double vision.  Respiratory:  Negative for cough and shortness of breath.   Cardiovascular:  Negative for chest pain.  Gastrointestinal:  Negative for abdominal pain, diarrhea and vomiting.  Genitourinary: Negative.   Musculoskeletal:  Positive for back pain and joint  pain.  Skin:  Negative for rash.  Neurological:  Positive for weakness. Negative for sensory change and headaches.   Past Medical History:  Diagnosis Date   Arthritis    "maybe in my hands/fingers; not terrible" (07/12/2015)   Chronic systolic CHF (congestive heart failure) (HCC)    Dilated cardiomyopathy (HCC)    Episode of dizziness, secondary to cardiomyopathy possibly  10/25/2014   Essential hypertension    Hypertensive heart disease    LBBB (left bundle branch block)    a. noted on 10/24/14 admission for dizziness    NICM (nonischemic cardiomyopathy) (HCC)    a. Cath 10/29/14: normal coronary arteries (tortuous, compatible with hypertension). b. s/p Med[employer] BiV-ICD 06/2015.   Tobacco abuse    Past Surgical History:  Procedure Laterality Date   ABDOMINAL HYSTERECTOMY  1970's   APPENDECTOMY     BI-VENTRICULAR IMPLANTABLE CARDIOVERTER DEFIBRILLATOR  (CRT-D)  07/11/2015   BIV ICD GENERATOR CHANGEOUT N/A 03/15/2020   Procedure: BIV ICD GENERATOR CHANGEOUT;  Surgeon: Marinus Maw, MD;  Location: [facility] INVASIVE CV LAB;  Service: Cardiovascular;  Laterality: N/A;   CARDIAC CATHETERIZATION     EP IMPLANTABLE DEVICE N/A 07/11/2015   Procedure: BiV ICD Insertion CRT-D;  Surgeon: Marinus Maw, MD;  Location: [facility] INVASIVE CV LAB;  Service: Cardiovascular;  Laterality: N/A;   LEFT HEART CATHETERIZATION WITH CORONARY ANGIOGRAM N/A 10/29/2014   Procedure: LEFT HEART CATHETERIZATION WITH CORONARY ANGIOGRAM;  Surgeon: Lesleigh Noe, MD;  Location: [facility] CATH LAB;  Service: Cardiovascular;  Laterality: N/A;  TONSILLECTOMY     TUBAL LIGATION  1970's   Family History  Problem Relation Age of Onset   Arrhythmia Mother    Hypertension Mother    Diabetes Mother    Heart disease Mother    Cancer Father    Lung cancer Sister    Healthy Paternal Aunt    Heart attack Neg Hx    Stroke Neg Hx    Social History:  reports that she has been smoking cigarettes. She has a 27.5 pack-year smoking  history. She has never used smokeless tobacco. She reports that she does not drink alcohol and does not use drugs. Allergies:  Allergies  Allergen Reactions   Shellfish Allergy Itching   Medications Prior to Admission  Medication Sig Dispense Refill   atorvastatin (LIPITOR) 20 MG tablet Take 20 mg by mouth daily.     carvedilol (COREG) 12.5 MG tablet Take 1 tablet (12.5 mg total) by mouth 2 (two) times daily with a meal. 180 tablet 2   irbesartan (AVAPRO) 300 MG tablet Take 1 tablet (300 mg total) by mouth daily. 30 tablet 0   Vitamin D, Ergocalciferol, (DRISDOL) 50000 UNITS CAPS capsule Take 50,000 Units by mouth 2 (two) times a week.       Home: Home Living Family/patient expects to be discharged to:: Private residence Living Arrangements: Other relatives Available Help at Discharge: Family Type of Home: House Home Access: Stairs to enter, Ship broker of Steps: 3 Home Layout: One level Bathroom Shower/Tub: Tub/shower unit Home Equipment: Grab bars - tub/shower, Grab bars - toilet, Agricultural consultant (2 wheels), Engineer, site History: Prior Function Prior Level of Function : Independent/Modified Independent, History of Falls (last six months) Mobility Comments: scooter for community mobility ADLs Comments: mod I Functional Status:  Mobility: Bed Mobility Overal bed mobility: Needs Assistance Bed Mobility: Supine to Sit, Sit to Supine Supine to sit: Mod assist Sit to supine: Max assist General bed mobility comments: trunk and le management Transfers Overall transfer level: Needs assistance Equipment used: Rolling walker (2 wheels) Transfers: Sit to/from Stand, Bed to chair/wheelchair/BSC Sit to Stand: Mod assist Bed to/from chair/wheelchair/BSC transfer type:: Step pivot Step pivot transfers: Mod assist General transfer comment: tolerated standing for ~5 minutes Ambulation/Gait General Gait Details: unable    ADL: ADL Overall  ADL's : Needs assistance/impaired Eating/Feeding: Minimal assistance Grooming: Contact guard assist, Sitting Grooming Details (indicate cue type and reason): CGA for sitting Upper Body Bathing: Moderate assistance, Sitting Lower Body Bathing: Maximal assistance, Sit to/from stand Upper Body Dressing : Moderate assistance, Sitting Lower Body Dressing: Sit to/from stand, Total assistance Toilet Transfer: Maximal assistance, +2 for physical assistance, +2 for safety/equipment, Stand-pivot, Rolling walker (2 wheels), BSC/3in1 Toileting- Clothing Manipulation and Hygiene: Total assistance Functional mobility during ADLs: Moderate assistance, Rolling walker (2 wheels) General ADL Comments: limited by global weakness, L weaker than R, limited activity tolerance, communication, cognition  Cognition: Cognition Overall Cognitive Status: Impaired/Different from baseline Orientation Level: Oriented X4 Cognition Arousal: Alert Behavior During Therapy: Flat affect Overall Cognitive Status: Impaired/Different from baseline Area of Impairment: Attention, Safety/judgement, Awareness, Problem solving, Following commands Current Attention Level: Sustained Following Commands: Follows one step commands consistently Awareness: Emergent Problem Solving: Slow processing, Decreased initiation, Difficulty sequencing, Requires verbal cues General Comments: flat affect, not many verbalizations made. Followed most simple 1 step commands with increased time for processing Difficult to assess due to: Level of arousal  Blood pressure (!) 157/78, pulse 77, temperature 98.7 F (37.1 C), temperature source  Oral, resp. rate 17, weight 89.6 kg, SpO2 92%. Physical Exam  General: No apparent distress HEENT: Head is normocephalic, atraumatic, sclera anicteric, oral mucosa pink and moist Neck: Supple  Heart: RRR Chest: CTA bilaterally without wheezes, rales, or rhonchi; no distress Abdomen: Soft, non-tender,  non-distended, bowel sounds positive. Extremities: No clubbing, cyanosis, or edema.  Psych: Pt's affect is flat Skin: Clean and intact without signs of breakdown Neuro:    Mental Status: AAOx3 Speech/Languate: Naming and repetition intact, follows simple commands, + dysarthria, limited verbal output only answers with one-word answers CRANIAL NERVES: II: PERRL. Visual fields full III, IV, VI: EOM intact V: normal sensation bilaterally VII: L facial droop VIII: normal hearing to speech IX, X: normal palatal elevation XI: 4/5 head turn and 4/5 shoulder shrug bilaterally XII: Tongue protrudes to the left   MOTOR: RUE: 4 out of 5 LUE: 4- out of 5 RLE: Hip flexion 3 out of 5, knee extension 4- out of 5, ankle PF and DF 3 out of 5 LLE: Hip flexion 2-3 out of 5, knee extension 4- out of 5, ankle PF and DF 3 out of 5  SENSORY: Normal to touch all 4 extremities  Coordination: Finger-nose slower on the left  MSK: No significant knee tenderness, swelling or pain with ROM noted  Results for orders placed or performed during the hospital encounter of 08/25/23 (from the past 24 hour(s))  Glucose, capillary     Status: Abnormal   Collection Time: 08/26/23  9:32 PM  Result Value Ref Range   Glucose-Capillary 140 (H) 70 - 99 mg/dL  Basic metabolic panel     Status: Abnormal   Collection Time: 08/27/23  4:59 AM  Result Value Ref Range   Sodium 140 135 - 145 mmol/L   Potassium 3.3 (L) 3.5 - 5.1 mmol/L   Chloride 108 98 - 111 mmol/L   CO2 23 22 - 32 mmol/L   Glucose, Bld 114 (H) 70 - 99 mg/dL   BUN 18 8 - 23 mg/dL   Creatinine, Ser 1.61 0.44 - 1.00 mg/dL   Calcium 9.8 8.9 - 09.6 mg/dL   GFR, Estimated >04 >54 mL/min   Anion gap 9 5 - 15  Glucose, capillary     Status: Abnormal   Collection Time: 08/27/23 12:01 PM  Result Value Ref Range   Glucose-Capillary 140 (H) 70 - 99 mg/dL   Comment 1 Notify RN    VAS [place] UPPER EXTREMITY ARTERIAL DUPLEX  Result Date: 08/26/2023  UPPER  EXTREMITY DUPLEX STUDY Patient Name:  BUFFIE PESSOLANO  Date of Exam:   08/26/2023 Medical Rec #: [id number]         Accession #:    [id number] Date of Birth: 08/10/1941        Patient Gender: F Patient Age:   37 years Exam Location:  [facility] Procedure:      VAS [place] UPPER EXTREMITY ARTERIAL DUPLEX Referring Phys: Scheryl Marten XU --------------------------------------------------------------------------------  Indications: Abnormal CT finding (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 History:     Patient has a history of Currently admitted for CVA.  Risk Factors:  Hypertension, hyperlipidemia, current smoker. Other Factors: CHF, ICD, PAD Comparison Study: No previous exams Performing Technologist: Jody Hill RVT, RDMS  Examination Guidelines: A complete evaluation includes B-mode imaging, spectral Doppler, color Doppler, and power Doppler as needed of all accessible portions of each vessel. Bilateral testing is considered an integral part of a complete examination. Limited examinations for reoccurring indications may be performed as  noted.  Right Doppler Findings: +---------------+----------+----------+-------------+--------+ Site           PSV (cm/s)Waveform  Stenosis     Comments +---------------+----------+----------+-------------+--------+ Subclavian Prox176       monophasic                      +---------------+----------+----------+-------------+--------+ Subclavian Mid 296       monophasic>50% stenosis         +---------------+----------+----------+-------------+--------+ Subclavian Dist310       monophasic>50% stenosis         +---------------+----------+----------+-------------+--------+ Axillary       532       monophasic>50% stenosis         +---------------+----------+----------+-------------+--------+ Brachial Prox  116       monophasic                      +---------------+----------+----------+-------------+--------+ Brachial Mid   99        monophasic                       +---------------+----------+----------+-------------+--------+ Brachial Dist  102       monophasic                      +---------------+----------+----------+-------------+--------+ Radial Mid     72        monophasic                      +---------------+----------+----------+-------------+--------+ Radial Dist    58        monophasic                      +---------------+----------+----------+-------------+--------+ Ulnar Mid      37        monophasic>50% stenosis         +---------------+----------+----------+-------------+--------+ Ulnar Dist     35        monophasic>50% stenosis         +---------------+----------+----------+-------------+--------+ Palmar Arch    28        monophasic                      +---------------+----------+----------+-------------+--------+    Summary:  Right: Post stenotic waveform in proximal subclavian may indicate a        more proximal occlusion. 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 *See table(s) above for measurements and observations. Suggest Peripheral Vascular Consult. Electronically signed by Sherald Hess MD on 08/26/2023 at 1:46:39 PM.    Final    VAS US CAROTID  Result Date: 08/26/2023 Carotid Arterial Duplex Study Patient Name:  Morey Hummingbird  Date of Exam:   08/26/2023 Medical Rec #: [id number]         Accession #:    [id number] Date of Birth: December 29, 1940        Patient Gender: F Patient Age:   75 years Exam [facility]:  [facility] Procedure:      VAS US CAROTID Referring Phys: [facility] --------------------------------------------------------------------------------  Indications:   CVA. Risk Factors:  Hypertension, hyperlipidemia, current smoker. Other Factors: CHF, ICD, PAD. Performing Technologist: Ernestene Mention RVT, RDMS  Examination Guidelines: A complete evaluation includes B-mode imaging, spectral Doppler, color Doppler, and power  Doppler as needed of all accessible portions of each vessel. Bilateral testing is considered an integral part of a complete  examination. Limited examinations for reoccurring indications may be performed as noted.  Right Carotid Findings: +----------+--------+--------+--------+---------------------+------------------+           PSV cm/sEDV cm/sStenosisPlaque Description   Comments           +----------+--------+--------+--------+---------------------+------------------+ CCA Prox  71      11              heterogenous                            +----------+--------+--------+--------+---------------------+------------------+ CCA Distal63      12              heterogenous                            +----------+--------+--------+--------+---------------------+------------------+ ICA Prox  142     24      40-59%  diffuse, irregular   diffuse calcific                                     and calcific         plaque and                                                                shadowing          +----------+--------+--------+--------+---------------------+------------------+ ICA Mid   70      14                                                      +----------+--------+--------+--------+---------------------+------------------+ ICA Distal75      18                                                      +----------+--------+--------+--------+---------------------+------------------+ ECA       241     21      >50%    heterogenous and                                                          calcific                                +----------+--------+--------+--------+---------------------+------------------+ +----------+--------+-------+--------+-------------------+           PSV cm/sEDV cmsDescribeArm Pressure (mmHG) +----------+--------+-------+--------+-------------------+ [id number]            Stenotic                     +----------+--------+-------+--------+-------------------+ +---------+--------+--+--------+-+ VertebralPSV cm/s35EDV cm/s7 +---------+--------+--+--------+-+  Left Carotid Findings: +----------+--------+--------+--------+---------------------+------------------+  PSV cm/sEDV cm/sStenosisPlaque Description   Comments           +----------+--------+--------+--------+---------------------+------------------+ CCA Prox  86      16              heterogenous         intimal thickening +----------+--------+--------+--------+---------------------+------------------+ CCA Distal63      12              heterogenous                            +----------+--------+--------+--------+---------------------+------------------+ ICA Prox  136     26              diffuse, irregular,                                                       calcific and                                                              heterogenous                            +----------+--------+--------+--------+---------------------+------------------+ ICA Mid   122     26                                                      +----------+--------+--------+--------+---------------------+------------------+ ICA Distal94      14                                                      +----------+--------+--------+--------+---------------------+------------------+ ECA       109     0               heterogenous                            +----------+--------+--------+--------+---------------------+------------------+ +----------+--------+--------+--------+-------------------+           PSV cm/sEDV cm/sDescribeArm Pressure (mmHG) +----------+--------+--------+--------+-------------------+ [id number]             Stenotic                    +----------+--------+--------+--------+-------------------+ +---------+--------+--+--------+-+ VertebralPSV cm/s55EDV cm/s8  +---------+--------+--+--------+-+   Summary: Right Carotid: Velocities in the right ICA are consistent with a 40-59%                stenosis. The ECA appears >50% stenosed. Left Carotid: Velocities in the left ICA are consistent with a 40-59% stenosis. Vertebrals:  Bilateral vertebral arteries demonstrate antegrade flow. Subclavians: Bilateral subclavian arteries were stenotic. *See table(s) above for measurements and observations.     Preliminary    DG Abd 2 Views  Result Date: 08/25/2023 CLINICAL DATA:  [id number] Vomiting [id number] EXAM: ABDOMEN - 2 VIEW COMPARISON:  None Available. FINDINGS: The bowel gas pattern is normal. There is no evidence of free air. No radio-opaque calculi or other significant radiographic abnormality is seen. Excreted contrast within the urinary bladder. IMPRESSION: Negative. Electronically Signed   By: Duanne Guess D.O.   On: 08/25/2023 21:45   DG Pelvis 1-2 Views  Result Date: 08/25/2023 CLINICAL DATA:  Pain EXAM: PELVIS - 1-2 VIEW COMPARISON:  None Available. FINDINGS: There is no evidence of pelvic fracture or diastasis. Hip joints are intact. Excreted contrast within the urinary bladder. IMPRESSION: Negative. Electronically Signed   By: Duanne Guess D.O.   On: 08/25/2023 21:44   ECHOCARDIOGRAM COMPLETE  Result Date: 08/25/2023    ECHOCARDIOGRAM REPORT   Patient Name:   LADAN HROMADKA Date of Exam: 08/25/2023 Medical Rec #:  [id number]        Height:       67.0 in Accession #:    [id number]       Weight:       197.5 lb Date of Birth:  October 12, 1940       BSA:          2.012 m Patient Age:    82 years         BP:           181/92 mmHg Patient Gender: F                HR:           88 bpm. Exam Location:  Inpatient Procedure: 2D Echo, Cardiac Doppler, Color Doppler and Saline Contrast Bubble            Study Indications:    Stroke  History:        Patient has prior history of Echocardiogram examinations, most                 recent 06/20/2015. CHF, Abnormal ECG and  Defibrillator,                 Arrythmias:LBBB, Signs/Symptoms:Dizziness/Lightheadedness; Risk                 Factors:Hypertension, Current Smoker and Dyslipidemia.  Sonographer:    Sheralyn Boatman RDCS Referring Phys: [id number] [facility] GOEL IMPRESSIONS  1. There is apical LV cavity systolic obliteration with a dynamic "gradient" of up to 2 m/s. There is no evidence of LV outflow obstruction and there is no systolic anterior motion of the mitral valve at rest or with the Valsalva maneuver. Left ventricular ejection fraction, by estimation, is 45 to 50%. The left ventricle has mildly decreased function. The left ventricle has no regional wall motion abnormalities. There is moderate asymmetric left ventricular hypertrophy of the basal-septal and apical segments. Indeterminate diastolic filling due to E-A fusion.  2. Right ventricular systolic function is normal. The right ventricular size is normal.  3. The mitral valve is degenerative. No evidence of mitral valve regurgitation. No evidence of mitral stenosis. Moderate mitral annular calcification.  4. The aortic valve is tricuspid. There is mild calcification of the aortic valve. There is moderate thickening of the aortic valve. Aortic valve regurgitation is trivial. Aortic valve sclerosis/calcification is present, without any evidence of aortic stenosis. Comparison(s): Prior images unable to be directly viewed, comparison made by report only. The left ventricular function has improved. FINDINGS  Left Ventricle: There is apical LV cavity systolic obliteration with a dynamic "gradient" of up to 2 m/s. There is  no evidence of LV outflow obstruction and there is no systolic anterior motion of the mitral valve at rest or with the Valsalva maneuver. Left ventricular ejection fraction, by estimation, is 45 to 50%. The left ventricle has mildly decreased function. The left ventricle has no regional wall motion abnormalities. The left ventricular internal cavity size was small. There  is moderate asymmetric left ventricular hypertrophy of the basal-septal and apical segments. Abnormal (paradoxical) septal motion, consistent with left bundle branch block. Indeterminate diastolic filling due to E-A fusion. Right Ventricle: The right ventricular size is normal. No increase in right ventricular wall thickness. Right ventricular systolic function is normal. Left Atrium: Left atrial size was normal in size. Right Atrium: Right atrial size was normal in size. Pericardium: There is no evidence of pericardial effusion. Mitral Valve: The mitral valve is degenerative in appearance. Moderate mitral annular calcification. No evidence of mitral valve regurgitation. No evidence of mitral valve stenosis. Tricuspid Valve: The tricuspid valve is grossly normal. Tricuspid valve regurgitation is not demonstrated. Aortic Valve: The aortic valve is tricuspid. There is mild calcification of the aortic valve. There is moderate thickening of the aortic valve. Aortic valve regurgitation is trivial. Aortic valve sclerosis/calcification is present, without any evidence of aortic stenosis. Pulmonic Valve: The pulmonic valve was not well visualized. Pulmonic valve regurgitation is not visualized. Aorta: The aortic root is normal in size and structure. IAS/Shunts: No atrial level shunt detected by color flow Doppler. Agitated saline contrast was given intravenously to evaluate for intracardiac shunting. Additional Comments: A device lead is visualized in the right ventricle.  LEFT VENTRICLE PLAX 2D LVIDd:         2.60 cm     Diastology LVIDs:         2.00 cm     LV e' medial:    3.70 cm/s LV PW:         1.75 cm     LV E/e' medial:  30.8 LV IVS:        2.15 cm     LV e' lateral:   4.35 cm/s LVOT diam:     1.90 cm     LV E/e' lateral: 26.2 LV SV:         56 LV SV Index:   28 LVOT Area:     2.84 cm  LV Volumes (MOD) LV vol d, MOD A2C: 62.1 ml LV vol d, MOD A4C: 61.9 ml LV vol s, MOD A2C: 30.0 ml LV vol s, MOD A4C: 27.9 ml LV SV  MOD A2C:     32.1 ml LV SV MOD A4C:     61.9 ml LV SV MOD BP:      39.3 ml RIGHT VENTRICLE             IVC RV S prime:     11.40 cm/s  IVC diam: 1.30 cm TAPSE (M-mode): 1.6 cm LEFT ATRIUM           Index        RIGHT ATRIUM          Index LA diam:      1.10 cm 0.55 cm/m   RA Area:     8.61 cm LA Vol (A2C): 17.4 ml 8.65 ml/m   RA Volume:   15.70 ml 7.80 ml/m LA Vol (A4C): 30.2 ml 15.01 ml/m  AORTIC VALVE LVOT Vmax:   116.00 cm/s LVOT Vmean:  77.700 cm/s LVOT VTI:    0.196 m  AORTA Ao Root diam: 3.30  cm MITRAL VALVE MV Area (PHT): 6.27 cm     SHUNTS MV Decel Time: 121 msec     Systemic VTI:  0.20 m MV E velocity: 114.00 cm/s  Systemic Diam: 1.90 cm Rachelle Hora Croitoru MD Electronically signed by Thurmon Fair MD Signature Date/Time: 08/25/2023/5:20:55 PM    Final    MR BRAIN WO CONTRAST  Result Date: 08/25/2023 CLINICAL DATA:  Provided history: Stroke, follow-up. EXAM: MRI HEAD WITHOUT CONTRAST TECHNIQUE: Multiplanar, multiecho pulse sequences of the brain and surrounding structures were obtained without intravenous contrast. COMPARISON:  Non-contrast head CT and CT angiogram head/neck 08/25/2023. FINDINGS: Brain: No age advanced or lobar predominant parenchymal atrophy. Mild cerebellar atrophy. 1.5 x 2.3 x 2.9 cm acute infarct within the right corona radiata/basal ganglia. Small acute infarcts within the right insula and right subinsular white matter. Chronic lacunar infarcts within bilateral cerebral hemispheric white matter and basal ganglia. Small focus of diffusion-weighted signal hyperintensity at site of a chronic infarct in the left basal ganglia, likely reflecting susceptibility artifact from chronic blood products at this site. Background advanced patchy and confluent T2 FLAIR hyperintense signal abnormality within the cerebral white matter, nonspecific but compatible with chronic small vessel ischemic disease. Small chronic infarcts within the left cerebellar hemisphere. 9 mm extra-axial mass along  the right aspect of the anterior falx, likely reflecting a meningioma (for instance as seen on series 11, image 41) (series 15, image 22). Contact upon the underlying right lobe without underlying parenchymal edema. There are a few chronic microhemorrhages scattered within the supratentorial brain. Partially empty sella turcica. No extra-axial fluid collection. No midline shift. Vascular: Maintained flow voids within the proximal large arterial vessels. 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 Skull and upper cervical spine: Indeterminate well-circumscribed 10 mm FLAIR hyperintense and T1 hypointense lesion within the left frontal calvarium (for instance as seen on series 11, image 44). Sinuses/Orbits: No mass or acute finding within the imaged orbits. Minimal mucosal thickening within the right ethmoid and left maxillary sinuses. IMPRESSION: 1. 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 2. Small acute infarcts within the right insula and right subinsular white matter. 3. Background advanced chronic small ischemic disease with multiple chronic infarcts, as described. 4. 9 mm extra-axial mass along the right aspect of the anterior falx, likely reflecting a meningioma. Contact upon the underlying right frontal lobe without underlying parenchymal edema 5. Mild cerebellar atrophy. 6. Indeterminate 10 mm osseous lesion within the left frontal calvarium. Consider a follow-up brain MRI in 3 months to ensure stability. Electronically Signed   By: Jackey Loge D.O.   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  Yes Co-Morbidities requiring supervision/potential complications:  -Cardiomyopathy with systolic HF, HTN, HLD, Tobacco abuse, Leukocytosis , hypokalemia , dysphagia, obesity Due to bladder management, bowel management, safety, skin/wound care, disease management, medication administration, pain management, and patient education, does the patient require 24 hr/day rehab nursing? Yes Does the patient require coordinated care of a physician, rehab nurse, therapy disciplines of PT/OT/SLP to address physical and functional deficits in the context of the above medical diagnosis(es)? Yes Addressing deficits in  the following areas: balance, endurance, locomotion, strength, transferring, bowel/bladder control, bathing, dressing, feeding, grooming, toileting, cognition, speech, language, swallowing, and psychosocial support Can the patient actively participate in an intensive therapy program of at least 3 hrs of therapy per day at least 5 days per week? Yes The potential for patient to make measurable gains while on inpatient rehab is excellent Anticipated functional outcomes upon discharge from inpatient rehab are supervision  with PT, supervision with OT, supervision with SLP. Estimated rehab length of stay to reach the above functional goals is: 7-10 days Anticipated discharge destination: Home Overall Rehab/Functional Prognosis: excellent  POST ACUTE RECOMMENDATIONS: This patient's condition is appropriate for continued rehabilitative care in the following setting: CIR Patient has agreed to participate in recommended program. Yes Note that insurance prior authorization may be required for reimbursement for recommended care.  Comment: Good candidate for CIR pending medical stability, Rehab coordinator to follow up.  Will need to follow on timing for potential right carotid endarterectomy.   I have personally performed a face to face diagnostic evaluation of this patient. Additionally, I have examined  the patient's medical record including any pertinent labs and radiographic images. If the physician assistant has documented in this note, I have reviewed and edited or otherwise concur with the physician assistant's documentation.  Thanks,  Fanny Dance, MD 08/27/2023

## 2023-08-28 DIAGNOSIS — I6523 Occlusion and stenosis of bilateral carotid arteries: Secondary | ICD-10-CM | POA: Diagnosis not present

## 2023-08-28 DIAGNOSIS — I639 Cerebral infarction, unspecified: Secondary | ICD-10-CM | POA: Diagnosis not present

## 2023-08-28 DIAGNOSIS — I63231 Cerebral infarction due to unspecified occlusion or stenosis of right carotid arteries: Secondary | ICD-10-CM | POA: Diagnosis not present

## 2023-08-28 LAB — BASIC METABOLIC PANEL
Anion gap: 7 (ref 5–15)
BUN: 19 mg/dL (ref 8–23)
CO2: 23 mmol/L (ref 22–32)
Calcium: 10 mg/dL (ref 8.9–10.3)
Chloride: 107 mmol/L (ref 98–111)
Creatinine, Ser: 0.8 mg/dL (ref 0.44–1.00)
GFR, Estimated: >60 mL/min (ref >60)
Glucose, Bld: 158 mg/dL — ABNORMAL HIGH (ref 70–99)
Potassium: 3.6 mmol/L (ref 3.5–5.1)
Sodium: 137 mmol/L (ref 135–145)

## 2023-08-28 LAB — CALCIUM, IONIZED: Calcium, Ionized, Serum: 5.5 mg/dL (ref 4.5–5.6)

## 2023-08-28 MED ORDER — POTASSIUM CHLORIDE CRYS ER 20 MEQ PO TBCR
40.0000 meq | EXTENDED_RELEASE_TABLET | Freq: Once | ORAL | Status: AC
  Administered 2023-08-28: 40 meq via ORAL
  Filled 2023-08-28: qty 2

## 2023-08-28 MED ORDER — CARVEDILOL 12.5 MG PO TABS
12.5000 mg | ORAL_TABLET | Freq: Two times a day (BID) | ORAL | Status: DC
  Administered 2023-08-28 – 2023-08-30 (×4): 12.5 mg via ORAL
  Filled 2023-08-28 (×4): qty 1

## 2023-08-28 MED ORDER — CARVEDILOL 12.5 MG PO TABS: 25.0000 mg | ORAL_TABLET | Freq: Two times a day (BID) | ORAL | Status: DC

## 2023-08-28 MED ORDER — SPIRONOLACTONE 12.5 MG HALF TABLET
12.5000 mg | ORAL_TABLET | Freq: Every day | ORAL | Status: DC
  Administered 2023-08-28 – 2023-08-29 (×2): 12.5 mg via ORAL
  Filled 2023-08-28 (×3): qty 1

## 2023-08-28 NOTE — Plan of Care (Signed)
Problem: Education: Goal: Knowledge of disease or condition will improve 08/28/2023 1712 by Lucita Lora, RN Outcome: Progressing 08/28/2023 0754 by Lucita Lora, RN Outcome: Progressing Goal: Knowledge of secondary prevention will improve (MUST DOCUMENT ALL) 08/28/2023 1712 by Lucita Lora, RN Outcome: Progressing 08/28/2023 0754 by Lucita Lora, RN Outcome: Progressing Goal: Knowledge of patient specific risk factors will improve Nicole Chan N/A or DELETE if not current risk factor) 08/28/2023 1712 by Lucita Lora, RN Outcome: Progressing 08/28/2023 0754 by Lucita Lora, RN Outcome: Progressing   Problem: Ischemic Stroke/TIA Tissue Perfusion: Goal: Complications of ischemic stroke/TIA will be minimized 08/28/2023 1712 by Lucita Lora, RN Outcome: Progressing 08/28/2023 0754 by Lucita Lora, RN Outcome: Progressing   Problem: Coping: Goal: Will verbalize positive feelings about self 08/28/2023 1712 by Lucita Lora, RN Outcome: Progressing 08/28/2023 0754 by Lucita Lora, RN Outcome: Progressing Goal: Will identify appropriate support needs 08/28/2023 1712 by Lucita Lora, RN Outcome: Progressing 08/28/2023 0754 by Lucita Lora, RN Outcome: Progressing   Problem: Health Behavior/Discharge Planning: Goal: Ability to manage health-related needs will improve 08/28/2023 1712 by Lucita Lora, RN Outcome: Progressing 08/28/2023 0754 by Lucita Lora, RN Outcome: Progressing Goal: Goals will be collaboratively established with patient/family 08/28/2023 1712 by Lucita Lora, RN Outcome: Progressing 08/28/2023 0754 by Lucita Lora, RN Outcome: Progressing   Problem: Self-Care: Goal: Ability to participate in self-care as condition permits will improve 08/28/2023 1712 by Lucita Lora, RN Outcome: Progressing 08/28/2023 0754 by Lucita Lora, RN Outcome: Progressing Goal: Verbalization of feelings and  concerns over difficulty with self-care will improve 08/28/2023 1712 by Lucita Lora, RN Outcome: Progressing 08/28/2023 0754 by Lucita Lora, RN Outcome: Progressing Goal: Ability to communicate needs accurately will improve 08/28/2023 1712 by Lucita Lora, RN Outcome: Progressing 08/28/2023 0754 by Lucita Lora, RN Outcome: Progressing   Problem: Nutrition: Goal: Risk of aspiration will decrease 08/28/2023 1712 by Lucita Lora, RN Outcome: Progressing 08/28/2023 0754 by Lucita Lora, RN Outcome: Progressing Goal: Dietary intake will improve 08/28/2023 1712 by Lucita Lora, RN Outcome: Progressing 08/28/2023 0754 by Lucita Lora, RN Outcome: Progressing   Problem: Education: Goal: Knowledge of General Education information will improve Description: Including pain rating scale, medication(s)/side effects and non-pharmacologic comfort measures 08/28/2023 1712 by Lucita Lora, RN Outcome: Progressing 08/28/2023 0754 by Lucita Lora, RN Outcome: Progressing   Problem: Health Behavior/Discharge Planning: Goal: Ability to manage health-related needs will improve 08/28/2023 1712 by Lucita Lora, RN Outcome: Progressing 08/28/2023 0754 by Lucita Lora, RN Outcome: Progressing   Problem: Clinical Measurements: Goal: Ability to maintain clinical measurements within normal limits will improve 08/28/2023 1712 by Lucita Lora, RN Outcome: Progressing 08/28/2023 0754 by Lucita Lora, RN Outcome: Progressing Goal: Will remain free from infection 08/28/2023 1712 by Lucita Lora, RN Outcome: Progressing 08/28/2023 0754 by Lucita Lora, RN Outcome: Progressing Goal: Diagnostic test results will improve 08/28/2023 1712 by Lucita Lora, RN Outcome: Progressing 08/28/2023 0754 by Lucita Lora, RN Outcome: Progressing Goal: Respiratory complications will improve 08/28/2023 1712 by Lucita Lora, RN Outcome:  Progressing 08/28/2023 0754 by Lucita Lora, RN Outcome: Progressing Goal: Cardiovascular complication will be avoided 08/28/2023 1712 by Lucita Lora, RN Outcome: Progressing 08/28/2023 0754 by Lucita Lora, RN Outcome: Progressing   Problem: Activity: Goal: Risk for activity intolerance will decrease 08/28/2023 1712 by Lucita Lora, RN Outcome: Progressing  08/28/2023 0754 by Lucita Lora, RN Outcome: Progressing   Problem: Nutrition: Goal: Adequate nutrition will be maintained 08/28/2023 1712 by Lucita Lora, RN Outcome: Progressing 08/28/2023 0754 by Lucita Lora, RN Outcome: Progressing   Problem: Coping: Goal: Level of anxiety will decrease 08/28/2023 1712 by Lucita Lora, RN Outcome: Progressing 08/28/2023 0754 by Lucita Lora, RN Outcome: Progressing   Problem: Elimination: Goal: Will not experience complications related to bowel motility 08/28/2023 1712 by Lucita Lora, RN Outcome: Progressing 08/28/2023 0754 by Lucita Lora, RN Outcome: Progressing Goal: Will not experience complications related to urinary retention 08/28/2023 1712 by Lucita Lora, RN Outcome: Progressing 08/28/2023 0754 by Lucita Lora, RN Outcome: Progressing   Problem: Pain Management: Goal: General experience of comfort will improve 08/28/2023 1712 by Lucita Lora, RN Outcome: Progressing 08/28/2023 0754 by Lucita Lora, RN Outcome: Progressing   Problem: Safety: Goal: Ability to remain free from injury will improve 08/28/2023 1712 by Lucita Lora, RN Outcome: Progressing 08/28/2023 0754 by Lucita Lora, RN Outcome: Progressing   Problem: Skin Integrity: Goal: Risk for impaired skin integrity will decrease 08/28/2023 1712 by Lucita Lora, RN Outcome: Progressing 08/28/2023 0754 by Lucita Lora, RN Outcome: Progressing

## 2023-08-28 NOTE — Progress Notes (Signed)
[facility] PROGRESS NOTE    Progress Note  SHANIQUAH SAYARATH  ZOX:[id number] DOB: 03-Feb-1941 DOA: 08/25/2023 PCP: Andi Devon, MD   Brief Narrative:   KAASHVI PANZICA is an 82 y.o. female past medical history significant for essential hypertension, was of the history was obtained from the chart and son at bedside.  He was found on the floor at bedside at 7:30 AM on the day of admission, last time known to be normal was 10 PM the night before.  She smelled like urine was having dysarthria and right arm weakness with poor mentation.  CTA of the head showed no acute findings extensive chronic small vessel disease.  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  PT evaluated the patient will need inpatient rehab. CT angio of the head and neck showed bilateral 50% stenosis ICA Transthoracic Echo showed an EF of 50%, no wall motion abnormality.   Now on DAPT therapy for 3 weeks. Carotid Doppler showed greater than 50% stenosis of the right ICA and 40 to 50% left ICA She is a current tobacco smoker she has been consulted on quitting. Vascular surgery was consulted recommended CEA next week. Patient being evaluated for inpatient rehab.  Incidental subclavian artery thrombosis: Seen on CT head and neck that showed moderate filling defect nonocclusive. Upper extremity ultrasound showed 50 to 75% stenosis of the mid distal subclavian artery and 75 to 99% stenosis in the axillary artery. Further management per vascular surgery.  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  No underlying edema.  Will need a 10-month follow-up. SPEP and UPEP pending, this is low yield in the setting of normal renal function and unremarkable hemoglobin  Mild hypercalcemia: Resolved with IV fluids.  Likely due  to dehydration.  Leukocytosis: Likely reactive has remained afebrile, now resolved. Tmax 99.3.  Tobacco abuse: She has been counseled family has been informed that she needs to quit smoking.   DVT prophylaxis: lovenox Family Communication:none Status is: Inpatient Remains inpatient appropriate because: Acute CVA    Code Status:     Code Status Orders  (From admission, onward)           Start     Ordered   08/25/23 1315  Full code  Continuous       Question:  By:  Answer:  Other   08/25/23 1315           Code Status History     Date Active Date Inactive Code Status Order ID Comments User Context   07/11/2015 1536 07/12/2015 1216 Full Code [id number]  Marinus Maw, MD Inpatient   10/29/2014 1338 10/29/2014 1912 Full Code [id number]  Lyn Records, MD Inpatient   10/24/2014 1937 10/26/2014 2243 Full Code [id number]  Nahser, Deloris Ping, MD Inpatient         IV Access:   Peripheral IV   Procedures and diagnostic studies:   DG Swallowing Func-Speech Pathology  Result Date: 08/27/2023 Table formatting from the original result was not included. Modified Barium Swallow Study Patient Details Name: MACARIA FABRE MRN: [id number] Date of Birth: 12/01/1940 Today's Date: 08/27/2023 HPI/PMH: HPI: Kalaysha Maggard is an 82 yr old female who presented to [facility] via EMS on 08/25/2023. She was found down by family. EMS activated code stroke alert for left sided  weakness and questionable rt gaze preference. 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 Small acute infarcts within the right insula and right subinsular  white matter.   PMH of CHF, Cardiomyopathy, HTN, pacemaker. Clinical Impression: Clinical Impression: Pt presents with a moderate sensorimotor oropharyngeal dysphagia. There was impaired oral control with residue on tongue and left lateral sulcus. Pharyngeal swallow onset occurred at the level of the pyriforms, with spillage into the larynx  and trace, silent aspiration of thin liquids.  Nectar thick liquids reached the level of the vocal folds without eliciting a cough response. Laryngeal vestibule closure was adequate but often occurred too late to be fully protective. There was reduced pharyngeal squeeze and base of tongue contact, leading to mild residue primarily in the valleculae, with notable retrograde flow of some residue from lower pharynx into mid-pharynx.  There was partial obstruction of flow through PES and occasional trace backflow observed to squeeze upward through PES.  Technical difficulty prevented adequate screen of the esophagus. A head turn to the left was effective in preventing penetration/aspiration with nectar-thick liquids.  Pt has good potential for recovery of swallow - recommend continuing dysphagia 1 for now; allow nectar thick liquids but pt must take single sips and turn head over left should for each swallow. Meds should be crushed.  Her daughter [name] was present for study and we reviewed results and recs; pt/dtr agree with plan. SLP will follow. Factors that may increase risk of adverse event in presence of aspiration [name] & [name] 2021): No data recorded Recommendations/Plan: Swallowing Evaluation Recommendations Swallowing Evaluation Recommendations Recommendations: PO diet PO Diet Recommendation: Dysphagia 1 (Pureed); Mildly thick liquids (Level 2, nectar thick) Liquid Administration via: Spoon; Cup; Straw Medication Administration: Crushed with puree Supervision: Full assist for feeding Swallowing strategies  : Head turn left during swallowing; Slow rate; Small bites/sips Postural changes: Position pt fully upright for meals Oral care recommendations: Oral care BID (2x/day) Treatment Plan Treatment Plan Treatment recommendations: Therapy as outlined in treatment plan below Follow-up recommendations: Acute inpatient rehab (3 hours/day) Functional status assessment: Patient has had a recent decline in their  functional status and demonstrates the ability to make significant improvements in function in a reasonable and predictable amount of time. Treatment frequency: Min 2x/week Treatment duration: 2 weeks Interventions: Aspiration precaution training; Patient/family education; Trials of upgraded texture/liquids; Compensatory techniques; Oropharyngeal exercises Recommendations Recommendations for follow up therapy are one component of a multi-disciplinary discharge planning process, led by the attending physician.  Recommendations may be updated based on patient status, additional functional criteria and insurance authorization. Assessment: Orofacial Exam: Orofacial Exam Oral Cavity: Oral Hygiene: WFL Oral Cavity - Dentition: Edentulous Orofacial Anatomy: WFL Oral Motor/Sensory Function: Suspected cranial nerve impairment CN V - Trigeminal: Left sensory impairment CN VII - Facial: Left motor impairment Anatomy: Anatomy: Suspected cervical osteophytes Boluses Administered: Boluses Administered Boluses Administered: Thin liquids (Level 0); Mildly thick liquids (Level 2, nectar thick); Moderately thick liquids (Level 3, honey thick); Puree  Oral Impairment Domain: Oral Impairment Domain Lip Closure: Interlabial escape, no progression to anterior lip Tongue control during bolus hold: Cohesive bolus between tongue to palatal seal Bolus preparation/mastication: -- (n/a) Bolus transport/lingual motion: Delayed initiation of tongue motion (oral holding) Oral residue: Residue collection on oral structures Location of oral residue : Tongue; Floor of mouth Initiation of pharyngeal swallow : Pyriform sinuses  Pharyngeal Impairment Domain: Pharyngeal Impairment Domain Soft palate elevation: No bolus between soft palate (SP)/pharyngeal wall (PW) Laryngeal  elevation: Complete superior movement of thyroid cartilage with complete approximation of arytenoids to epiglottic petiole Anterior hyoid excursion: Complete anterior movement  Epiglottic movement: Complete inversion Laryngeal vestibule closure: Incomplete, narrow column air/contrast in laryngeal vestibule Pharyngeal stripping wave : Present - diminished Pharyngeal contraction (A/P view only): N/A Pharyngoesophageal segment opening: Partial distention/partial duration, partial obstruction of flow Tongue base retraction: Narrow column of contrast or air between tongue base and PPW Pharyngeal residue: Collection of residue within or on pharyngeal structures Location of pharyngeal residue: Tongue base; Valleculae; Pharyngeal wall; Pyriform sinuses  Esophageal Impairment Domain: Esophageal Impairment Domain Esophageal clearance upright position: -- (tech difficulties viewing esophagus) Pill: Pill Consistency administered: -- (NT) Penetration/Aspiration Scale Score: Penetration/Aspiration Scale Score 1.  Material does not enter airway: Puree; Moderately thick liquids (Level 3, honey thick) 5.  Material enters airway, CONTACTS cords and not ejected out: Mildly thick liquids (Level 2, nectar thick) 8.  Material enters airway, passes BELOW cords without attempt by patient to eject out (silent aspiration) : Thin liquids (Level 0) Compensatory Strategies: No data recorded  General Information: Caregiver present: Yes  Diet Prior to this Study: Dysphagia 1 (pureed); Thin liquids (Level 0)   Temperature : Normal   Respiratory Status: WFL   Supplemental O2: None (Room air)   History of Recent Intubation: No  Behavior/Cognition: Alert Self-Feeding Abilities: Needs assist with self-feeding Baseline vocal quality/speech: Normal Volitional Cough: Able to elicit Volitional Swallow: Able to elicit Exam Limitations: Limited visibility Goal Planning: Prognosis for improved oropharyngeal function: Good No data recorded No data recorded No data recorded Consulted and agree with results and recommendations: Patient; Family member/caregiver Pain: Pain Assessment Pain Assessment: No/denies pain Faces Pain Scale: 4  Pain Location: generalized Pain Descriptors / Indicators: Guarding; Grimacing Pain Intervention(s): Monitored during session End of Session: Start Time:SLP Start Time (ACUTE ONLY): 1430 Stop Time: SLP Stop Time (ACUTE ONLY): 1500 Time Calculation:SLP Time Calculation (min) (ACUTE ONLY): 30 min Charges: SLP Evaluations $ SLP Speech Visit: 1 Visit SLP Evaluations $MBS Swallow: 1 Procedure $ SLP EVAL LANGUAGE/SOUND PRODUCTION: 1 Procedure $Swallowing Treatment: 1 Procedure SLP visit diagnosis: SLP Visit Diagnosis: Dysphagia, oropharyngeal phase (R13.12) Past Medical History: Past Medical History: Diagnosis Date  Arthritis   "maybe in my hands/fingers; not terrible" (07/12/2015)  Chronic systolic CHF (congestive heart failure) (HCC)   Dilated cardiomyopathy (HCC)   Episode of dizziness, secondary to cardiomyopathy possibly  10/25/2014  Essential hypertension   Hypertensive heart disease   LBBB (left bundle branch block)   a. noted on 10/24/14 admission for dizziness   NICM (nonischemic cardiomyopathy) (HCC)   a. Cath 10/29/14: normal coronary arteries (tortuous, compatible with hypertension). b. s/p [employer] BiV-ICD 06/2015.  Tobacco abuse  Past Surgical History: Past Surgical History: Procedure Laterality Date  ABDOMINAL HYSTERECTOMY  1970's  APPENDECTOMY    BI-VENTRICULAR IMPLANTABLE CARDIOVERTER DEFIBRILLATOR  (CRT-D)  07/11/2015  BIV ICD GENERATOR CHANGEOUT N/A 03/15/2020  Procedure: BIV ICD GENERATOR CHANGEOUT;  Surgeon: Marinus Maw, MD;  Location: [facility] INVASIVE CV LAB;  Service: Cardiovascular;  Laterality: N/A;  CARDIAC CATHETERIZATION    EP IMPLANTABLE DEVICE N/A 07/11/2015  Procedure: BiV ICD Insertion CRT-D;  Surgeon: Marinus Maw, MD;  Location: [facility] INVASIVE CV LAB;  Service: Cardiovascular;  Laterality: N/A;  LEFT HEART CATHETERIZATION WITH CORONARY ANGIOGRAM N/A 10/29/2014  Procedure: LEFT HEART CATHETERIZATION WITH CORONARY ANGIOGRAM;  Surgeon: Lesleigh Noe, MD;  Location: [facility] CATH LAB;  Service:  Cardiovascular;  Laterality: N/A;  TONSILLECTOMY    TUBAL LIGATION  1970's  [name] [name] 08/27/2023, 3:59 PM  VAS [place] UPPER EXTREMITY ARTERIAL DUPLEX  Result Date: 08/26/2023  UPPER EXTREMITY DUPLEX STUDY Patient Name:  SHAQUONDA STELMA  Date of Exam:   08/26/2023 Medical Rec #: [id number]         Accession #:    [id number] Date of Birth: 24-May-1941        Patient Gender: F Patient Age:   26 years Exam Location:  [facility] Procedure:      VAS [place] UPPER EXTREMITY ARTERIAL DUPLEX Referring Phys: Scheryl Marten XU --------------------------------------------------------------------------------  Indications: Abnormal CT finding (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 History:     Patient has a history of Currently admitted for CVA.  Risk Factors:  Hypertension, hyperlipidemia, current smoker. Other Factors: CHF, ICD, PAD Comparison Study: No previous exams Performing Technologist: Jody Hill RVT, RDMS  Examination Guidelines: A complete evaluation includes B-mode imaging, spectral Doppler, color Doppler, and power Doppler as needed of all accessible portions of each vessel. Bilateral testing is considered an integral part of a complete examination. Limited examinations for reoccurring indications may be performed as noted.  Right Doppler Findings: +---------------+----------+----------+-------------+--------+ Site           PSV (cm/s)Waveform  Stenosis     Comments +---------------+----------+----------+-------------+--------+ Subclavian Prox176       monophasic                      +---------------+----------+----------+-------------+--------+ Subclavian Mid 296       monophasic>50% stenosis         +---------------+----------+----------+-------------+--------+ Subclavian Dist310       monophasic>50% stenosis         +---------------+----------+----------+-------------+--------+ Axillary       532       monophasic>50% stenosis          +---------------+----------+----------+-------------+--------+ Brachial Prox  116       monophasic                      +---------------+----------+----------+-------------+--------+ Brachial Mid   99        monophasic                      +---------------+----------+----------+-------------+--------+ Brachial Dist  102       monophasic                      +---------------+----------+----------+-------------+--------+ Radial Mid     72        monophasic                      +---------------+----------+----------+-------------+--------+ Radial Dist    58        monophasic                      +---------------+----------+----------+-------------+--------+ Ulnar Mid      37        monophasic>50% stenosis         +---------------+----------+----------+-------------+--------+ Ulnar Dist     35        monophasic>50% stenosis         +---------------+----------+----------+-------------+--------+ Palmar Arch    28        monophasic                      +---------------+----------+----------+-------------+--------+    Summary:  Right: Post stenotic waveform in proximal subclavian may indicate a        more proximal occlusion. 50-74% stenosis  in the mid and        distal subclavian artery. 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 *See table(s) above for measurements and observations. Suggest Peripheral Vascular Consult. Electronically signed by Sherald Hess MD on 08/26/2023 at 1:46:39 PM.    Final    VAS US CAROTID  Result Date: 08/26/2023 Carotid Arterial Duplex Study Patient Name:  Morey Hummingbird  Date of Exam:   08/26/2023 Medical Rec #: [id number]         Accession #:    [id number] Date of Birth: [birth year]        Patient Gender: F Patient Age:   23 years Exam Location:  [facility] Procedure:      VAS US CAROTID Referring Phys: [facility] --------------------------------------------------------------------------------  Indications:   CVA. Risk Factors:   Hypertension, hyperlipidemia, current smoker. Other Factors: CHF, ICD, PAD. Performing Technologist: Ernestene Mention RVT, RDMS  Examination Guidelines: A complete evaluation includes B-mode imaging, spectral Doppler, color Doppler, and power Doppler as needed of all accessible portions of each vessel. Bilateral testing is considered an integral part of a complete examination. Limited examinations for reoccurring indications may be performed as noted.  Right Carotid Findings: +----------+--------+--------+--------+---------------------+------------------+           PSV cm/sEDV cm/sStenosisPlaque Description   Comments           +----------+--------+--------+--------+---------------------+------------------+ CCA Prox  71      11              heterogenous                            +----------+--------+--------+--------+---------------------+------------------+ CCA Distal63      12              heterogenous                            +----------+--------+--------+--------+---------------------+------------------+ ICA Prox  142     24      40-59%  diffuse, irregular   diffuse calcific                                     and calcific         plaque and                                                                shadowing          +----------+--------+--------+--------+---------------------+------------------+ ICA Mid   70      14                                                      +----------+--------+--------+--------+---------------------+------------------+ ICA Distal75      18                                                      +----------+--------+--------+--------+---------------------+------------------+  ECA       241     21      >50%    heterogenous and                                                          calcific                                +----------+--------+--------+--------+---------------------+------------------+  +----------+--------+-------+--------+-------------------+           PSV cm/sEDV cmsDescribeArm Pressure (mmHG) +----------+--------+-------+--------+-------------------+ [id number]            Stenotic                    +----------+--------+-------+--------+-------------------+ +---------+--------+--+--------+-+ VertebralPSV cm/s35EDV cm/s7 +---------+--------+--+--------+-+  Left Carotid Findings: +----------+--------+--------+--------+---------------------+------------------+           PSV cm/sEDV cm/sStenosisPlaque Description   Comments           +----------+--------+--------+--------+---------------------+------------------+ CCA Prox  86      16              heterogenous         intimal thickening +----------+--------+--------+--------+---------------------+------------------+ CCA Distal63      12              heterogenous                            +----------+--------+--------+--------+---------------------+------------------+ ICA Prox  136     26              diffuse, irregular,                                                       calcific and                                                              heterogenous                            +----------+--------+--------+--------+---------------------+------------------+ ICA Mid   122     26                                                      +----------+--------+--------+--------+---------------------+------------------+ ICA Distal94      14                                                      +----------+--------+--------+--------+---------------------+------------------+ ECA       109     0  heterogenous                            +----------+--------+--------+--------+---------------------+------------------+ +----------+--------+--------+--------+-------------------+           PSV cm/sEDV cm/sDescribeArm Pressure (mmHG)  +----------+--------+--------+--------+-------------------+ [id number]             Stenotic                    +----------+--------+--------+--------+-------------------+ +---------+--------+--+--------+-+ VertebralPSV cm/s55EDV cm/s8 +---------+--------+--+--------+-+   Summary: Right Carotid: Velocities in the right ICA are consistent with a 40-59%                stenosis. The ECA appears >50% stenosed. Left Carotid: Velocities in the left ICA are consistent with a 40-59% stenosis. Vertebrals:  Bilateral vertebral arteries demonstrate antegrade flow. Subclavians: Bilateral subclavian arteries were stenotic. *See table(s) above for measurements and observations.     Preliminary      Medical Consultants:   None.   Subjective:    ZIRA YOHO no complaints today.  Objective:    Vitals:   08/27/23 1610 08/27/23 2018 08/28/23 0350 08/28/23 0824  BP: (!) 170/94 (!) 150/76 (!) 164/120 (!) 161/125  Pulse: 80 (!) 57 90 (!) 113  Resp: 18 16 16 16   Temp: 99.2 F (37.3 C) 98.9 F (37.2 C) 98.3 F (36.8 C) 98.4 F (36.9 C)  TempSrc: Oral Oral Oral Oral  SpO2: 96% 95% 97% 100%  Weight:       SpO2: 100 %   Intake/Output Summary (Last 24 hours) at 08/28/2023 0847 Last data filed at 08/28/2023 0831 Gross per 24 hour  Intake 236 ml  Output 900 ml  Net -664 ml   Filed Weights   08/25/23 0800  Weight: 89.6 kg    Exam: General exam: In no acute distress. Respiratory system: Good air movement and clear to auscultation. Cardiovascular system: S1 & S2 heard, RRR. No JVD. Gastrointestinal system: Abdomen is nondistended, soft and nontender.  Central nervous system: Alert and oriented. No focal neurological deficits. Extremities: No pedal edema. Skin: No rashes, lesions or ulcers Psychiatry: Judgement and insight appear normal. Mood & affect appropriate.  Data Reviewed:    Labs: Basic Metabolic Panel: Recent Labs  Lab 08/25/23 0823 08/25/23 0830  08/25/23 1535 08/26/23 0444 08/27/23 0459 08/28/23 0415  NA 138 141  --   --  140 137  K 3.7 3.7  --   --  3.3* 3.6  CL 106 108  --   --  108 107  CO2 19*  --   --   --  23 23  GLUCOSE 169* 170*  --   --  114* 158*  BUN 17 17  --   --  18 19  CREATININE 0.98 0.90 0.81  --  0.90 0.80  CALCIUM 10.4*  --   --   --  9.8 10.0  PHOS  --   --   --  3.1  --   --    GFR Estimated Creatinine Clearance: 62.3 mL/min (by C-G formula based on SCr of 0.8 mg/dL). Liver Function Tests: Recent Labs  Lab 08/25/23 0823  AST 27  ALT 23  ALKPHOS 73  BILITOT 1.5*  PROT 6.8  ALBUMIN 3.6   No results for input(s): "LIPASE", "AMYLASE" in the last 168 hours. No results for input(s): "AMMONIA" in the last 168 hours. Coagulation profile Recent Labs  Lab 08/25/23 0823  INR 1.1   COVID-19 Labs  No results for input(s): "DDIMER", "FERRITIN", "LDH", "CRP" in the last 72 hours.  Lab Results  Component Value Date   SARSCOV2NAA Not Detected 04/12/2019    CBC: Recent Labs  Lab 08/25/23 0823 08/25/23 0830 08/25/23 1640  WBC 13.9*  --  12.5*  NEUTROABS 11.6*  --   --   HGB 14.9 15.6* 14.3  HCT 45.3 46.0 42.3  MCV 88.6  --  86.5  PLT 226  --  217   Cardiac Enzymes: Recent Labs  Lab 08/25/23 1535  CKTOTAL 144   BNP (last 3 results) No results for input(s): "PROBNP" in the last 8760 hours. CBG: Recent Labs  Lab 08/25/23 0827 08/25/23 1658 08/26/23 2132 08/27/23 1201 08/27/23 1608  GLUCAP 159* 168* 140* 140* 144*   D-Dimer: No results for input(s): "DDIMER" in the last 72 hours. Hgb A1c: Recent Labs    08/25/23 1640  HGBA1C 5.4   Lipid Profile: Recent Labs    08/26/23 0444  CHOL 203*  HDL 35*  LDLCALC 141*  TRIG 136  CHOLHDL 5.8   Thyroid function studies: No results for input(s): "TSH", "T4TOTAL", "T3FREE", "THYROIDAB" in the last 72 hours.  Invalid input(s): "FREET3" Anemia work up: No results for input(s): "VITAMINB12", "FOLATE", "FERRITIN", "TIBC", "IRON",  "RETICCTPCT" in the last 72 hours. Sepsis Labs: Recent Labs  Lab 08/25/23 0823 08/25/23 1640  WBC 13.9* 12.5*   Microbiology No results found for this or any previous visit (from the past 240 hour(s)).   Medications:    aspirin EC  81 mg Oral Daily   atorvastatin  80 mg Oral Daily   carvedilol  12.5 mg Oral BID WC   clopidogrel  75 mg Oral Daily   enoxaparin (LOVENOX) injection  40 mg Subcutaneous Q24H   hydrochlorothiazide  12.5 mg Oral Daily   irbesartan  300 mg Oral Daily   mouth rinse  15 mL Mouth Rinse 4 times per day   Continuous Infusions:      LOS: 3 days   Marinda Elk  [facility]  08/28/2023, 8:47 AM

## 2023-08-28 NOTE — Progress Notes (Signed)
Speech Language Pathology Treatment: Dysphagia  Patient Details Name: Nicole Chan MRN: [id number] DOB: [birth year] Today's Date: 08/28/2023 Time: 1308-6578 SLP Time Calculation (min) (ACUTE ONLY): 17 min  Assessment / Plan / Recommendation Clinical Impression  Pt seen for diet tolerance and adherence to compensatory strategies following recent MBS. Upon arrival, the pt was lethargic and required intermittent cueing to stay awake for PO trials. Per the daughter's report, the pt has had reduced PO intake and reports not liking her new diet. The pt able to recall left head turn comp strategy and recount the changes to her diet. The pt had x3 trials of nectar thick liquid, the pt had mild anterior loss x2, a wet vocal quality x3, and multiple swallows for each trial. The pt required mod visual and verbal cues to do left head turn, with reduced range of motion and noted dysfunction in sequencing the head turn then the swallow. PO trials stopped due to increasing s/sx of aspiration. Per daughter's report, the pt has more slurred speech and is more lethargic in the afternoon and that she is more alert in the morning. SLP also discussed integrating ensure to increase nutritional intake due to reduced PO intake with diet change. SLP to attempt f/u in the morning with PO trials to determine if she's more successful when more awake/alert.   HPI HPI: Nicole Chan is an 82 yr old female who presented to [facility] via EMS on 08/25/2023. She was found down by family. EMS activated code stroke alert for left sided weakness and questionable rt gaze preference. 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 Small acute infarcts within the right insula and right subinsular  white matter.   PMH of CHF, Cardiomyopathy, HTN, pacemaker.      SLP Plan  Continue with current plan of care      Recommendations for follow up therapy are one component of a multi-disciplinary discharge  planning process, led by the attending physician.  Recommendations may be updated based on patient status, additional functional criteria and insurance authorization.    Recommendations  Diet recommendations: Nectar-thick liquid;Dysphagia 1 (puree) Liquids provided via: Teaspoon Medication Administration: Whole meds with puree Supervision: Full supervision/cueing for compensatory strategies Compensations: Slow rate;Small sips/bites;Monitor for anterior loss;Lingual sweep for clearance of pocketing;Multiple dry swallows after each bite/sip Postural Changes and/or Swallow Maneuvers: Seated upright 90 degrees;Upright 30-60 min after meal;Head turn left during swallow                       Dysphagia, oropharyngeal phase (R13.12)     Continue with current plan of care    Dione Housekeeper M.S. CCC-SLP

## 2023-08-28 NOTE — Plan of Care (Signed)

## 2023-08-28 NOTE — Progress Notes (Signed)
[facility] [place]  Subjective  -states she has decided to have carotid surgery next week.   Objective (!) 161/125 (!) 113 98.4 F (36.9 C) (Oral) 16 100%  Intake/Output Summary (Last 24 hours) at 08/28/2023 1100 Last data filed at 08/28/2023 0831 Gross per 24 hour  Intake 236 ml  Output 900 ml  Net -664 ml    Left facial droop Left upper extremity 4 out of 5 Left hip flexion 2 out of 5 Left dorsiflexion 3 out of 5 Right upper extremity 4 out of 5   Laboratory Lab Results: Recent Labs    08/25/23 1640  WBC 12.5*  HGB 14.3  HCT 42.3  PLT 217   BMET Recent Labs    08/27/23 0459 08/28/23 0415  NA 140 137  K 3.3* 3.6  CL 108 107  CO2 23 23  GLUCOSE 114* 158*  BUN 18 19  CREATININE 0.90 0.80  CALCIUM 9.8 10.0    COAG Lab Results  Component Value Date   INR 1.1 08/25/2023   INR 1.00 10/29/2014   No results found for: "PTT"  Assessment/Planning:  82 year old female admitted after a code stroke.  MRI showed right basal ganglia and corona radiata infarct with 2 insular infarcts.  She had 50 to 60% right ICA stenosis by CTA and duplex.  Neurology feels that this is likely secondary to her carotid disease as an embolic stroke.  I have offered the family a right carotid endarterectomy on Monday.  She is currently scheduled.  Patient states that she has decided to proceed with surgery.  I will keep her on the schedule.  I would like to see her little stronger and we will continue to have conversation through the weekend.  Cephus Shelling 08/28/2023 11:00 AM --

## 2023-08-29 DIAGNOSIS — I63231 Cerebral infarction due to unspecified occlusion or stenosis of right carotid arteries: Secondary | ICD-10-CM | POA: Diagnosis not present

## 2023-08-29 DIAGNOSIS — I10 Essential (primary) hypertension: Secondary | ICD-10-CM | POA: Diagnosis not present

## 2023-08-29 DIAGNOSIS — I639 Cerebral infarction, unspecified: Secondary | ICD-10-CM | POA: Diagnosis not present

## 2023-08-29 DIAGNOSIS — N179 Acute kidney failure, unspecified: Secondary | ICD-10-CM | POA: Diagnosis not present

## 2023-08-29 DIAGNOSIS — D329 Benign neoplasm of meninges, unspecified: Secondary | ICD-10-CM | POA: Insufficient documentation

## 2023-08-29 DIAGNOSIS — I6523 Occlusion and stenosis of bilateral carotid arteries: Secondary | ICD-10-CM | POA: Diagnosis not present

## 2023-08-29 LAB — BASIC METABOLIC PANEL
Anion gap: 13 (ref 5–15)
BUN: 48 mg/dL — ABNORMAL HIGH (ref 8–23)
CO2: 20 mmol/L — ABNORMAL LOW (ref 22–32)
Calcium: 10.8 mg/dL — ABNORMAL HIGH (ref 8.9–10.3)
Chloride: 105 mmol/L (ref 98–111)
Creatinine, Ser: 1.48 mg/dL — ABNORMAL HIGH (ref 0.44–1.00)
GFR, Estimated: 35 mL/min — ABNORMAL LOW (ref >60)
Glucose, Bld: 150 mg/dL — ABNORMAL HIGH (ref 70–99)
Potassium: 3.8 mmol/L (ref 3.5–5.1)
Sodium: 138 mmol/L (ref 135–145)

## 2023-08-29 MED ORDER — AMLODIPINE BESYLATE 5 MG PO TABS
5.0000 mg | ORAL_TABLET | Freq: Every day | ORAL | Status: DC
  Administered 2023-08-29: 5 mg via ORAL
  Filled 2023-08-29 (×2): qty 1

## 2023-08-29 MED ORDER — HYDRALAZINE HCL 25 MG PO TABS: 25.0000 mg | ORAL_TABLET | Freq: Four times a day (QID) | ORAL | Status: DC | PRN

## 2023-08-29 MED ORDER — SODIUM CHLORIDE 0.9 % IV SOLN: INTRAVENOUS | Status: AC

## 2023-08-29 NOTE — Plan of Care (Signed)

## 2023-08-29 NOTE — Progress Notes (Signed)
[facility] PROGRESS NOTE    Progress Note  ELEEN Chan  ZOX:[id number] DOB: Apr 28, 1941 DOA: 08/25/2023 PCP: Andi Devon, MD   Brief Narrative:   Nicole Chan is an 82 y.o. female past medical history significant for essential hypertension, was of the history was obtained from the chart and son at bedside.  He was found on the floor at bedside at 7:30 AM on the day of admission, last time known to be normal was 10 PM the night before.  She smelled like urine was having dysarthria and right arm weakness with poor mentation.  CTA of the head showed no acute findings, extensive chronic small vessel disease. MRI of the brain confirmed acute CVA. Transthoracic Echo showed an EF of 50%, no wall motion abnormality.    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  PT evaluated the patient will need inpatient rehab. CT angio of the head and neck showed bilateral 50% stenosis ICA Now on DAPT therapy for 3 weeks. Carotid Doppler showed greater than 50% stenosis of the right ICA and 40 to 50% left ICA.  CTA of the head showed more than 50% proximal ICA bilateral stenosis at the neck. Vascular surgery was consulted and she is scheduled for CEA on 08/30/2023, patient to decide.  Incidental subclavian artery thrombosis: Seen on CT head and neck that showed moderate filling defect nonocclusive. Upper extremity ultrasound showed 50 to 75% stenosis of the mid distal subclavian artery and 75 to 99% stenosis in the axillary artery. Further management per vascular surgery.  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  No underlying edema.  Will need a 16-month follow-up. SPEP and UPEP pending, this is low yield in the setting of normal renal function and unremarkable hemoglobin  New acute kidney injury: Possibly due to poor oral  intake hold hydrochlorothiazide and ARB. Started on IV fluids recheck basic metabolic panel in the morning.  Essential hypertension: She was on Coreg and Avapro. Blood pressure is trending up, yesterday she was started on hydrochlorothiazide and Aldactone there is a mild rise in her creatinine.  Hydrochlorothiazide and Avapro have been held. Will start her on oral Norvasc as his blood pressure is trending up. Use hydralazine IV as needed.  Mild hypercalcemia: Resolved with IV fluids.  Likely due to dehydration.  Leukocytosis: Likely reactive has remained afebrile, now resolved. Tmax 99.3.  Tobacco abuse: She has been counseled family has been informed that she needs to quit smoking.   DVT prophylaxis: lovenox Family Communication:none Status is: Inpatient Remains inpatient appropriate because: Acute CVA    Code Status:     Code Status Orders  (From admission, onward)           Start     Ordered   08/25/23 1315  Full code  Continuous       Question:  By:  Answer:  Other   08/25/23 1315           Code Status History     Date Active Date Inactive Code Status Order ID Comments User Context   07/11/2015 1536 07/12/2015 1216 Full Code [id number]  Marinus Maw, MD Inpatient   10/29/2014 1338 10/29/2014 1912 Full Code [id number]  Lyn Records, MD Inpatient   10/24/2014 1937 10/26/2014 2243 Full Code [id number]  Nahser, Deloris Ping, MD Inpatient  IV Access:   Peripheral IV   Procedures and diagnostic studies:   DG Swallowing Func-Speech Pathology  Result Date: 08/27/2023 Table formatting from the original result was not included. Modified Barium Swallow Study Patient Details Name: LAIKIN LONGTIN MRN: [id number] Date of Birth: Oct 31, 1940 Today's Date: 08/27/2023 HPI/PMH: HPI: Nicole Chan is an 82 yr old female who presented [facility] via EMS on 08/25/2023. She was found down by family. EMS activated code stroke alert for left sided weakness and  questionable rt gaze preference. 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 Small acute infarcts within the right insula and right subinsular  white matter.   PMH of CHF, Cardiomyopathy, HTN, pacemaker. Clinical Impression: Clinical Impression: Pt presents with a moderate sensorimotor oropharyngeal dysphagia. There was impaired oral control with residue on tongue and left lateral sulcus. Pharyngeal swallow onset occurred at the level of the pyriforms, with spillage into the larynx and trace, silent aspiration of thin liquids.  Nectar thick liquids reached the level of the vocal folds without eliciting a cough response. Laryngeal vestibule closure was adequate but often occurred too late to be fully protective. There was reduced pharyngeal squeeze and base of tongue contact, leading to mild residue primarily in the valleculae, with notable retrograde flow of some residue from lower pharynx into mid-pharynx.  There was partial obstruction of flow through PES and occasional trace backflow observed to squeeze upward through PES.  Technical difficulty prevented adequate screen of the esophagus. A head turn to the left was effective in preventing penetration/aspiration with nectar-thick liquids.  Pt has good potential for recovery of swallow - recommend continuing dysphagia 1 for now; allow nectar thick liquids but pt must take single sips and turn head over left should for each swallow. Meds should be crushed.  Her daughter [name] was present for study and we reviewed results and recs; pt/dtr agree with plan. SLP will follow. Factors that may increase risk of adverse event in presence of aspiration [name] & [name] 2021): No data recorded Recommendations/Plan: Swallowing Evaluation Recommendations Swallowing Evaluation Recommendations Recommendations: PO diet PO Diet Recommendation: Dysphagia 1 (Pureed); Mildly thick liquids (Level 2, nectar thick) Liquid Administration via: Spoon;  Cup; Straw Medication Administration: Crushed with puree Supervision: Full assist for feeding Swallowing strategies  : Head turn left during swallowing; Slow rate; Small bites/sips Postural changes: Position pt fully upright for meals Oral care recommendations: Oral care BID (2x/day) Treatment Plan Treatment Plan Treatment recommendations: Therapy as outlined in treatment plan below Follow-up recommendations: Acute inpatient rehab (3 hours/day) Functional status assessment: Patient has had a recent decline in their functional status and demonstrates the ability to make significant improvements in function in a reasonable and predictable amount of time. Treatment frequency: Min 2x/week Treatment duration: 2 weeks Interventions: Aspiration precaution training; Patient/family education; Trials of upgraded texture/liquids; Compensatory techniques; Oropharyngeal exercises Recommendations Recommendations for follow up therapy are one component of a multi-disciplinary discharge planning process, led by the attending physician.  Recommendations may be updated based on patient status, additional functional criteria and insurance authorization. Assessment: Orofacial Exam: Orofacial Exam Oral Cavity: Oral Hygiene: WFL Oral Cavity - Dentition: Edentulous Orofacial Anatomy: WFL Oral Motor/Sensory Function: Suspected cranial nerve impairment CN V - Trigeminal: Left sensory impairment CN VII - Facial: Left motor impairment Anatomy: Anatomy: Suspected cervical osteophytes Boluses Administered: Boluses Administered Boluses Administered: Thin liquids (Level 0); Mildly thick liquids (Level 2, nectar thick); Moderately thick liquids (Level 3, honey thick); Puree  Oral  Impairment Domain: Oral Impairment Domain Lip Closure: Interlabial escape, no progression to anterior lip Tongue control during bolus hold: Cohesive bolus between tongue to palatal seal Bolus preparation/mastication: -- (n/a) Bolus transport/lingual motion: Delayed  initiation of tongue motion (oral holding) Oral residue: Residue collection on oral structures Location of oral residue : Tongue; Floor of mouth Initiation of pharyngeal swallow : Pyriform sinuses  Pharyngeal Impairment Domain: Pharyngeal Impairment Domain Soft palate elevation: No bolus between soft palate (SP)/pharyngeal wall (PW) Laryngeal elevation: Complete superior movement of thyroid cartilage with complete approximation of arytenoids to epiglottic petiole Anterior hyoid excursion: Complete anterior movement Epiglottic movement: Complete inversion Laryngeal vestibule closure: Incomplete, narrow column air/contrast in laryngeal vestibule Pharyngeal stripping wave : Present - diminished Pharyngeal contraction (A/P view only): N/A Pharyngoesophageal segment opening: Partial distention/partial duration, partial obstruction of flow Tongue base retraction: Narrow column of contrast or air between tongue base and PPW Pharyngeal residue: Collection of residue within or on pharyngeal structures Location of pharyngeal residue: Tongue base; Valleculae; Pharyngeal wall; Pyriform sinuses  Esophageal Impairment Domain: Esophageal Impairment Domain Esophageal clearance upright position: -- (tech difficulties viewing esophagus) Pill: Pill Consistency administered: -- (NT) Penetration/Aspiration Scale Score: Penetration/Aspiration Scale Score 1.  Material does not enter airway: Puree; Moderately thick liquids (Level 3, honey thick) 5.  Material enters airway, CONTACTS cords and not ejected out: Mildly thick liquids (Level 2, nectar thick) 8.  Material enters airway, passes BELOW cords without attempt by patient to eject out (silent aspiration) : Thin liquids (Level 0) Compensatory Strategies: No data recorded  General Information: Caregiver present: Yes  Diet Prior to this Study: Dysphagia 1 (pureed); Thin liquids (Level 0)   Temperature : Normal   Respiratory Status: WFL   Supplemental O2: None (Room air)   History of Recent  Intubation: No  Behavior/Cognition: Alert Self-Feeding Abilities: Needs assist with self-feeding Baseline vocal quality/speech: Normal Volitional Cough: Able to elicit Volitional Swallow: Able to elicit Exam Limitations: Limited visibility Goal Planning: Prognosis for improved oropharyngeal function: Good No data recorded No data recorded No data recorded Consulted and agree with results and recommendations: Patient; Family member/caregiver Pain: Pain Assessment Pain Assessment: No/denies pain Faces Pain Scale: 4 Pain Location: generalized Pain Descriptors / Indicators: Guarding; Grimacing Pain Intervention(s): Monitored during session End of Session: Start Time:SLP Start Time (ACUTE ONLY): 1430 Stop Time: SLP Stop Time (ACUTE ONLY): 1500 Time Calculation:SLP Time Calculation (min) (ACUTE ONLY): 30 min Charges: SLP Evaluations $ SLP Speech Visit: 1 Visit SLP Evaluations $MBS Swallow: 1 Procedure $ SLP EVAL LANGUAGE/SOUND PRODUCTION: 1 Procedure $Swallowing Treatment: 1 Procedure SLP visit diagnosis: SLP Visit Diagnosis: Dysphagia, oropharyngeal phase (R13.12) Past Medical History: Past Medical History: Diagnosis Date  Arthritis   "maybe in my hands/fingers; not terrible" (07/12/2015)  Chronic systolic CHF (congestive heart failure) (HCC)   Dilated cardiomyopathy (HCC)   Episode of dizziness, secondary to cardiomyopathy possibly  10/25/2014  Essential hypertension   Hypertensive heart disease   LBBB (left bundle branch block)   a. noted on 10/24/14 admission for dizziness   NICM (nonischemic cardiomyopathy) (HCC)   a. Cath 10/29/14: normal coronary arteries (tortuous, compatible with hypertension). b. s/p Med[employer]onic BiV-ICD 06/2015.  Tobacco abuse  Past Surgical History: Past Surgical History: Procedure Laterality Date  ABDOMINAL HYSTERECTOMY  1970's  APPENDECTOMY    BI-VENTRICULAR IMPLANTABLE CARDIOVERTER DEFIBRILLATOR  (CRT-D)  07/11/2015  BIV ICD GENERATOR CHANGEOUT N/A 03/15/2020  Procedure: BIV ICD GENERATOR  CHANGEOUT;  Surgeon: Marinus Maw, MD;  Location: [facility] INVASIVE CV LAB;  Service: Cardiovascular;  Laterality: N/A;  CARDIAC CATHETERIZATION    EP IMPLANTABLE DEVICE N/A 07/11/2015  Procedure: BiV ICD Insertion CRT-D;  Surgeon: Marinus Maw, MD;  Location: [facility] INVASIVE CV LAB;  Service: Cardiovascular;  Laterality: N/A;  LEFT HEART CATHETERIZATION WITH CORONARY ANGIOGRAM N/A 10/29/2014  Procedure: LEFT HEART CATHETERIZATION WITH CORONARY ANGIOGRAM;  Surgeon: Lesleigh Noe, MD;  Location: [facility] CATH LAB;  Service: Cardiovascular;  Laterality: N/A;  TONSILLECTOMY    TUBAL LIGATION  1970's Carolan Shiver 08/27/2023, 3:59 PM    Medical Consultants:   None.   Subjective:    CHEYANN GIPP this morning she relates she did not sleep overnight.  Objective:    Vitals:   08/28/23 2011 08/28/23 2311 08/29/23 0326 08/29/23 0842  BP: (!) 150/97 (!) 150/99 (!) 168/116 (!) 171/97  Pulse: 84 99 88 91  Resp: 18 18 18 17   Temp: 98.6 F (37 C) 97.6 F (36.4 C) (!) 97.3 F (36.3 C) 98.4 F (36.9 C)  TempSrc: Oral Oral Oral Oral  SpO2: 92% 94% 93% 95%  Weight:       SpO2: 95 %   Intake/Output Summary (Last 24 hours) at 08/29/2023 0851 Last data filed at 08/28/2023 1200 Gross per 24 hour  Intake 263 ml  Output 350 ml  Net -87 ml   Filed Weights   08/25/23 0800  Weight: 89.6 kg    Exam: General exam: In no acute distress. Respiratory system: Good air movement and clear to auscultation. Cardiovascular system: S1 & S2 heard, RRR. No JVD. Gastrointestinal system: Abdomen is nondistended, soft and nontender.  Extremities: No pedal edema. Skin: No rashes, lesions or ulcers Psychiatry: Judgment and insight appear to be intact.  Data Reviewed:    Labs: Basic Metabolic Panel: Recent Labs  Lab 08/25/23 0823 08/25/23 0830 08/25/23 1535 08/26/23 0444 08/27/23 0459 08/28/23 0415 08/29/23 0735  NA 138 141  --   --  140 137 138  K 3.7 3.7  --   --  3.3* 3.6 3.8  CL 106 108   --   --  108 107 105  CO2 19*  --   --   --  23 23 20*  GLUCOSE 169* 170*  --   --  114* 158* 150*  BUN 17 17  --   --  18 19 48*  CREATININE 0.98 0.90 0.81  --  0.90 0.80 1.48*  CALCIUM 10.4*  --   --   --  9.8 10.0 10.8*  PHOS  --   --   --  3.1  --   --   --    GFR Estimated Creatinine Clearance: 33.7 mL/min (A) (by C-G formula based on SCr of 1.48 mg/dL (H)). Liver Function Tests: Recent Labs  Lab 08/25/23 0823  AST 27  ALT 23  ALKPHOS 73  BILITOT 1.5*  PROT 6.8  ALBUMIN 3.6   No results for input(s): "LIPASE", "AMYLASE" in the last 168 hours. No results for input(s): "AMMONIA" in the last 168 hours. Coagulation profile Recent Labs  Lab 08/25/23 0823  INR 1.1   COVID-19 Labs  No results for input(s): "DDIMER", "FERRITIN", "LDH", "CRP" in the last 72 hours.  Lab Results  Component Value Date   SARSCOV2NAA Not Detected 04/12/2019    CBC: Recent Labs  Lab 08/25/23 0823 08/25/23 0830 08/25/23 1640  WBC 13.9*  --  12.5*  NEUTROABS 11.6*  --   --   HGB 14.9 15.6* 14.3  HCT 45.3 46.0 42.3  MCV 88.6  --  86.5  PLT 226  --  217   Cardiac Enzymes: Recent Labs  Lab 08/25/23 1535  CKTOTAL 144   BNP (last 3 results) No results for input(s): "PROBNP" in the last 8760 hours. CBG: Recent Labs  Lab 08/25/23 0827 08/25/23 1658 08/26/23 2132 08/27/23 1201 08/27/23 1608  GLUCAP 159* 168* 140* 140* 144*   D-Dimer: No results for input(s): "DDIMER" in the last 72 hours. Hgb A1c: No results for input(s): "HGBA1C" in the last 72 hours.  Lipid Profile: No results for input(s): "CHOL", "HDL", "LDLCALC", "TRIG", "CHOLHDL", "LDLDIRECT" in the last 72 hours.  Thyroid function studies: No results for input(s): "TSH", "T4TOTAL", "T3FREE", "THYROIDAB" in the last 72 hours.  Invalid input(s): "FREET3" Anemia work up: No results for input(s): "VITAMINB12", "FOLATE", "FERRITIN", "TIBC", "IRON", "RETICCTPCT" in the last 72 hours. Sepsis Labs: Recent Labs  Lab  08/25/23 0823 08/25/23 1640  WBC 13.9* 12.5*   Microbiology No results found for this or any previous visit (from the past 240 hour(s)).   Medications:    amLODipine  5 mg Oral Daily   aspirin EC  81 mg Oral Daily   atorvastatin  80 mg Oral Daily   carvedilol  12.5 mg Oral BID WC   clopidogrel  75 mg Oral Daily   enoxaparin (LOVENOX) injection  40 mg Subcutaneous Q24H   mouth rinse  15 mL Mouth Rinse 4 times per day   spironolactone  12.5 mg Oral Daily   Continuous Infusions:      LOS: 4 days   Marinda Elk  [facility]  08/29/2023, 8:51 AM

## 2023-08-29 NOTE — Progress Notes (Signed)
[facility] [place]  Subjective - states today she is unsure about surgery tomorrow and may decide against this.   Objective (!) 171/97 91 98.4 F (36.9 C) (Oral) 17 95%  Intake/Output Summary (Last 24 hours) at 08/29/2023 1002 Last data filed at 08/28/2023 1200 Gross per 24 hour  Intake 263 ml  Output 350 ml  Net -87 ml    Left facial droop Left upper extremity 4 out of 5 Right upper extremity 4 out of 5 Left hip flexion 2 out of 5 Left dorsiflexion 3 out of 5 Right hip flexion 2 out of 5 Right dosiflexion 3 out of 5   Laboratory Lab Results: No results for input(s): "WBC", "HGB", "HCT", "PLT" in the last 72 hours.  BMET Recent Labs    08/28/23 0415 08/29/23 0735  NA 137 138  K 3.6 3.8  CL 107 105  CO2 23 20*  GLUCOSE 158* 150*  BUN 19 48*  CREATININE 0.80 1.48*  CALCIUM 10.0 10.8*    COAG Lab Results  Component Value Date   INR 1.1 08/25/2023   INR 1.00 10/29/2014   No results found for: "PTT"  Assessment/Planning:  81 year old female admitted after a code stroke.  MRI showed right basal ganglia and corona radiata infarct with 2 insular infarcts.  She had 50 to 60% right ICA stenosis by CTA and duplex.  Neurology feels that this is likely secondary to her carotid disease as an embolic stroke.  I have offered the family a right carotid endarterectomy on Monday.    Yesterday she was agreeable to surgery tomorrow.  Today she states she does not want surgery and wants more time to think about it.  I will call and discuss with family to keep them updated.  In addition she has an AKI and have discussed with hospitalist who has started her on some IV hydration.  Certainly if her kidney function worsens I would cancel her operation and allow her to be optimized from a medical standpoint.  I discussed with her today the options are surgery tomorrow versus medical therapy versus going to rehab and then doing surgery in delayed fashion when  she feels stronger.  Cephus Shelling 08/29/2023 10:02 AM --

## 2023-08-29 NOTE — Progress Notes (Signed)
Pt refused to consent for carotid cath, family at bedside agreeable with pt to wait on the procedure. MD notified.

## 2023-08-29 DEATH — deceased

## 2023-08-30 ENCOUNTER — Inpatient Hospital Stay (HOSPITAL_COMMUNITY): Payer: Medicare Other

## 2023-08-30 ENCOUNTER — Encounter (HOSPITAL_COMMUNITY): Admission: EM | Disposition: E | Payer: Self-pay | Source: Home / Self Care | Attending: Internal Medicine

## 2023-08-30 ENCOUNTER — Encounter (HOSPITAL_COMMUNITY): Payer: Self-pay | Admitting: Anesthesiology

## 2023-08-30 DIAGNOSIS — N179 Acute kidney failure, unspecified: Secondary | ICD-10-CM | POA: Diagnosis not present

## 2023-08-30 DIAGNOSIS — I639 Cerebral infarction, unspecified: Secondary | ICD-10-CM | POA: Diagnosis not present

## 2023-08-30 DIAGNOSIS — J9601 Acute respiratory failure with hypoxia: Secondary | ICD-10-CM

## 2023-08-30 DIAGNOSIS — E43 Unspecified severe protein-calorie malnutrition: Secondary | ICD-10-CM | POA: Insufficient documentation

## 2023-08-30 DIAGNOSIS — I6523 Occlusion and stenosis of bilateral carotid arteries: Secondary | ICD-10-CM | POA: Diagnosis not present

## 2023-08-30 DIAGNOSIS — I63231 Cerebral infarction due to unspecified occlusion or stenosis of right carotid arteries: Secondary | ICD-10-CM | POA: Diagnosis not present

## 2023-08-30 LAB — BLOOD GAS, ARTERIAL
Acid-base deficit: 3.9 mmol/L — ABNORMAL HIGH (ref 0.0–2.0)
Bicarbonate: 19.5 mmol/L — ABNORMAL LOW (ref 20.0–28.0)
O2 Content: 10 L/min
O2 Saturation: 89.5 %
Patient temperature: 37
pCO2 arterial: 30 mm[Hg] — ABNORMAL LOW (ref 32–48)
pH, Arterial: 7.42 (ref 7.35–7.45)
pO2, Arterial: 60 mm[Hg] — ABNORMAL LOW (ref 83–108)

## 2023-08-30 LAB — BASIC METABOLIC PANEL
Anion gap: 8 (ref 5–15)
BUN: 69 mg/dL — ABNORMAL HIGH (ref 8–23)
CO2: 19 mmol/L — ABNORMAL LOW (ref 22–32)
Calcium: 10.1 mg/dL (ref 8.9–10.3)
Chloride: 114 mmol/L — ABNORMAL HIGH (ref 98–111)
Creatinine, Ser: 1.43 mg/dL — ABNORMAL HIGH (ref 0.44–1.00)
GFR, Estimated: 37 mL/min — ABNORMAL LOW (ref >60)
Glucose, Bld: 142 mg/dL — ABNORMAL HIGH (ref 70–99)
Potassium: 3.4 mmol/L — ABNORMAL LOW (ref 3.5–5.1)
Sodium: 141 mmol/L (ref 135–145)

## 2023-08-30 LAB — PHOSPHORUS
Phosphorus: 3.5 mg/dL (ref 2.5–4.6)
Phosphorus: 2.7 mg/dL (ref 2.5–4.6)

## 2023-08-30 LAB — LACTIC ACID, PLASMA: Lactic Acid, Venous: 2.3 mmol/L (ref 0.5–1.9)

## 2023-08-30 LAB — GLUCOSE, CAPILLARY
Glucose-Capillary: 156 mg/dL — ABNORMAL HIGH (ref 70–99)
Glucose-Capillary: 136 mg/dL — ABNORMAL HIGH (ref 70–99)
Glucose-Capillary: 171 mg/dL — ABNORMAL HIGH (ref 70–99)

## 2023-08-30 LAB — BRAIN NATRIURETIC PEPTIDE: B Natriuretic Peptide: 171.5 pg/mL — ABNORMAL HIGH (ref 0.0–100.0)

## 2023-08-30 LAB — MAGNESIUM
Magnesium: 1.9 mg/dL (ref 1.7–2.4)
Magnesium: 1.9 mg/dL (ref 1.7–2.4)

## 2023-08-30 LAB — D-DIMER, QUANTITATIVE: D-Dimer, Quant: 3.07 ug{FEU}/mL — ABNORMAL HIGH (ref 0.00–0.50)

## 2023-08-30 SURGERY — ENDARTERECTOMY, CAROTID
Anesthesia: General | Laterality: Right

## 2023-08-30 MED ORDER — ATORVASTATIN CALCIUM 80 MG PO TABS
80.0000 mg | ORAL_TABLET | Freq: Every day | ORAL | Status: DC
  Administered 2023-08-31 – 2023-09-01 (×2): 80 mg
  Filled 2023-08-30 (×2): qty 1

## 2023-08-30 MED ORDER — AMLODIPINE BESYLATE 5 MG PO TABS: 10.0000 mg | ORAL_TABLET | Freq: Every day | ORAL | Status: DC

## 2023-08-30 MED ORDER — OSMOLITE 1.5 CAL PO LIQD: 1000.0000 mL | ORAL | Status: DC

## 2023-08-30 MED ORDER — SODIUM CHLORIDE 0.9 % IV SOLN
3.0000 g | Freq: Four times a day (QID) | INTRAVENOUS | Status: DC
  Administered 2023-08-30 – 2023-08-31 (×3): 3 g via INTRAVENOUS
  Filled 2023-08-30 (×3): qty 8

## 2023-08-30 MED ORDER — PROSOURCE TF20 ENFIT COMPATIBL EN LIQD
60.0000 mL | Freq: Two times a day (BID) | ENTERAL | Status: DC
Start: 2023-08-30 — End: 2023-09-01
  Administered 2023-08-30 – 2023-09-01 (×5): 60 mL
  Filled 2023-08-30 (×6): qty 60

## 2023-08-30 MED ORDER — ASPIRIN 81 MG PO CHEW
81.0000 mg | CHEWABLE_TABLET | Freq: Every day | ORAL | Status: DC
  Administered 2023-08-31 – 2023-09-01 (×2): 81 mg
  Filled 2023-08-30 (×2): qty 1

## 2023-08-30 MED ORDER — THIAMINE MONONITRATE 100 MG PO TABS
100.0000 mg | ORAL_TABLET | Freq: Every day | ORAL | Status: DC
  Administered 2023-08-30 – 2023-09-01 (×3): 100 mg
  Filled 2023-08-30 (×4): qty 1

## 2023-08-30 MED ORDER — ADULT MULTIVITAMIN W/MINERALS CH
1.0000 | ORAL_TABLET | Freq: Every day | ORAL | Status: DC
  Administered 2023-08-30 – 2023-09-01 (×3): 1
  Filled 2023-08-30 (×4): qty 1

## 2023-08-30 MED ORDER — DEXTROSE-SODIUM CHLORIDE 5-0.45 % IV SOLN: INTRAVENOUS | Status: DC

## 2023-08-30 MED ORDER — LACTATED RINGERS IV BOLUS
1000.0000 mL | Freq: Once | INTRAVENOUS | Status: AC
  Administered 2023-08-31: 1000 mL via INTRAVENOUS

## 2023-08-30 MED ORDER — CLOPIDOGREL BISULFATE 75 MG PO TABS
75.0000 mg | ORAL_TABLET | Freq: Every day | ORAL | Status: DC
  Administered 2023-08-31 – 2023-09-01 (×2): 75 mg
  Filled 2023-08-30 (×2): qty 1

## 2023-08-30 NOTE — Progress Notes (Signed)
RT was called to bedside for low sats and increased oxygen demands. Patient was on 6L on arrival. RT placed patient on 10L highflow with sats in the low 90's. ABG obtained. MD made aware. Patient lethargic but able to open eyes and answer some questions. RT assisted RN and Rapid RN with patient going to CT. No complications noted on trip.

## 2023-08-30 NOTE — Progress Notes (Signed)
Initial Nutrition Assessment  DOCUMENTATION CODES:   Severe malnutrition in context of social or environmental circumstances  INTERVENTION:   --Spoke with MD who would like to hold off on starting TF today due to change in status and increased O2 requirement.   Recommend:  Initiate tube feeding via Cortrak tube: Osmolite 1.5 at 25 ml/h and increase by 10 ml every 8 hours to goal rate of 45 ml/h (1080 ml per day) Prosource TF20 60 ml BID  Provides 1780 kcal, 107 gm protein, 820 ml free water daily  Recommend 200 ml free water every 6 hours Total free water: 1620 ml - reached out to MD  NUTRITION DIAGNOSIS:   Severe Malnutrition related to social / environmental circumstances as evidenced by severe fat depletion, severe muscle depletion.  GOAL:   Patient will meet greater than or equal to 90% of their needs  MONITOR:   TF tolerance, Diet advancement  REASON FOR ASSESSMENT:   Consult Enteral/tube feeding initiation and management  ASSESSMENT:   Pt with PMH of HTN, CHF, and tobacco abuse found down by family, admitted with acute CVA.  Per chart review pt was scheduled for a R carotid endarterectomy but pt has declined.   Spoke with pt's RN who is at bedside. Spoke with pt's daughter. She reports pt was living with godson. Recently she had been sleeping more, usually got up in the afternoon and would have her first meal. She wears dentures, no report of issues with these. She does not use any assistive devices to ambulate.   Noted pt has continued to get more lethargic per family report. Stat head CT pending.   12/2 s/p cortrak placement, per xray tip of tube is in the distal stomach or proximal duodenum    Medications reviewed and include:  D5 1/2 NS @ 75 ml/hr   Labs reviewed:  K 3.4 CBGs: 140-144 Vitamin D: 21.71 (11/28)   NUTRITION - FOCUSED PHYSICAL EXAM:  Flowsheet Row Most Recent Value  Orbital Region Severe depletion  Upper Arm Region Unable to assess   Thoracic and Lumbar Region Unable to assess  Buccal Region Severe depletion  Temple Region Severe depletion  Clavicle Bone Region Severe depletion  Clavicle and Acromion Bone Region Severe depletion  Scapular Bone Region Severe depletion  Dorsal Hand Severe depletion  Patellar Region Mild depletion  Anterior Thigh Region Mild depletion  Posterior Calf Region Moderate depletion  Edema (RD Assessment) None  Hair Reviewed  Eyes Reviewed  Mouth Reviewed  Skin Reviewed  Nails Unable to assess  [painted]       Diet Order:   Diet Order             Diet NPO time specified Except for: Sips with Meds, Ice Chips  Diet effective midnight                   EDUCATION NEEDS:   Not appropriate for education at this time  Skin:  Skin Assessment: Reviewed RN Assessment  Last BM:  (P) 12/1 large  Height:   Ht Readings from Last 1 Encounters:  03/25/23 5\' 7"  (1.702 m)    Weight:   Wt Readings from Last 1 Encounters:  08/25/23 89.6 kg    BMI:  Body mass index is 30.94 kg/m.  Estimated Nutritional Needs:   Kcal:  1600-1800  Protein:  90-110 grams  Fluid:  >1.6 L/day  Cammy Copa., RD, LDN, CNSC See AMiON for contact information

## 2023-08-30 NOTE — Progress Notes (Signed)
Inpatient Rehab Admissions Coordinator:   I met with patient and her daughter, Nicole Chan, at the bedside.  Pt lethargic, does shake her head yes/no when directly addressed.  Per daughter some decline in mental status today.  We discussed insurance auth process and I will start that today.  We will follow for potential CIR admit pending insurance approval, and medical readiness.  Note pending CT today 2/2 decline in arousal and swallowing function.    Addend 1710: Note PCCM consult for deterioration of respiratory status.  I will continue to follow for progress.   Estill Dooms, PT, DPT Admissions Coordinator [phone] 08/30/23  5:11 PM

## 2023-08-30 NOTE — Progress Notes (Signed)
Speech Language Pathology Treatment: Dysphagia  Patient Details Name: Nicole Chan MRN: [id number] DOB: Mar 27, 1941 Today's Date: 08/30/2023 Time: 1029-1050 SLP Time Calculation (min) (ACUTE ONLY): 21 min  Assessment / Plan / Recommendation Clinical Impression  Ms. Mannie demonstrates further deterioration in swallow function today.  She was lethargic upon arrival, had difficulty following commands and increasing s/s of aspiration with PO intake. Only one sip of nectar thick liquid was offered - pt required max multimodal cues to initiate a swallow; onset was significantly delayed, followed by wet/congested cough. Oral suctioning provided to remove secretions/PO. Per RN, she was unable to swallow crushed meds this am. Called her daughter, Misty Stanley, to discuss concerns and recommended cortrak. Misty Stanley agrees with plan. Requested orders per Dr. Ashok Pall. D/W RN. Recommend NPO.   HPI HPI: Nicole Chan is an 82 yr old female who presented to [facility] via EMS on 08/25/2023. She was found down by family. EMS activated code stroke alert for left sided weakness and questionable rt gaze preference. 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 Small acute infarcts within the right insula and right subinsular  white matter.   PMH of CHF, Cardiomyopathy, HTN, pacemaker.      SLP Plan  Continue with current plan of care      Recommendations for follow up therapy are one component of a multi-disciplinary discharge planning process, led by the attending physician.  Recommendations may be updated based on patient status, additional functional criteria and insurance authorization.    Recommendations  Diet recommendations: NPO                  Oral care QID   Frequent or constant Supervision/Assistance Dysphagia, oropharyngeal phase (R13.12)     Continue with current plan of care    Ashiya Kinkead L. Samson Frederic, MA CCC/SLP Clinical Specialist - Acute Care SLP Acute  Rehabilitation Services Office number [phone]  [name] [name]  08/30/2023, 10:55 AM

## 2023-08-30 NOTE — Progress Notes (Signed)
NSG 0700-1900:   Pt noted yto have difficulty swallowing, worsening aphasia per family.  MD notified, ordered a repeat head CT.  SLP also in to assess pt, recommended a coretrak, which was placed earlier in the shift.  Pt noted to be 80% on RA, tachypnic.  MD made aware at that time.  Pt placed on 2L [place], titrated up to 6L, 02 SAT 87%.  RT and Rapid Nurse called, evaluated pt at the bedside, placed pt on 10L hi flow 02.  ABG drawn.   Pt brought down to CT with RR nurse, RT, and SWOT nurse.  Pt brought back to the 3W, placed on continuous 02 monitor, IVF infusing, abx started.  Numerous family members at the bedside throughout the shift and updated re: changes as they were happening.  WCTM

## 2023-08-30 NOTE — Progress Notes (Signed)
TRH night cross cover note:   I was notified by RN that the patient's blood pressure is now slightly lower, with systolic blood pressures noted to be in the 80s.  This is relative to systolic blood pressures that were in the low 100s since early this afternoon.   Otherwise, her vital signs appear stable, including afebrile, with heart rates in the 80s to 90s, respiratory rate 20.  She remains on high flow nasal cannula 10 L/min, unchanged from this afternoon, with associated most recent oxygen saturations in the mid 90s.   Per brief chart review, she is here with acute ischemic stroke complicated by dysphagia and acute hypoxic respiratory failure in the setting of suspected aspiration pneumonia, for which she has been started on Unasyn. PCCM was consulted earlier today.   She has experienced limited oral intake recently in the setting of her dysphagia, it is noted that NG tube was recently placed resume enteral feeds.  In addition to enteral feeding via NG tube, she is also on D5 half NS at 75 cc/h.  Per chart review, she has a net negative fluid balance over the course of this hospitalization of 2.8 L.  In light of the patient's most recent BP, the aforementioned net negative fluid balance, her underlying infection, and limited recent enteric intake, I have ordered a 1 liter LR bolus.     Update: no significant improvement in BP following 1 liter LR. Other VS remains stable, unchanged from above. I evaluated the patient at bedside, discussed with RN, and briefly spoke with patient's god-daugther at beside. Patient appears somnolent, but does not appear to be respiratory distress at this time. I have ordered two additional liters LR, stat EKG, stat cmp, cbc, mag. Per my discussions with RN, patient has had no urine output this current shift. I have asked RN to place foley.     Newton Pigg, DO Hospitalist

## 2023-08-30 NOTE — Progress Notes (Signed)
MEWS Progress Note  Patient Details Name: Nicole Chan MRN: [id number] DOB: 02/02/1941 Today's Date: 08/30/2023   MEWS Flowsheet Documentation:  Assess: MEWS Score Temp: 98.6 F (37 C) BP: (!) 110/54 MAP (mmHg): 69 Pulse Rate: 95 ECG Heart Rate: 100 Resp: (!) 24 Level of Consciousness: Alert SpO2: 95 % O2 Device: High Flow Nasal Cannula Patient Activity (if Appropriate): In bed O2 Flow Rate (L/min): 10 L/min Assess: MEWS Score MEWS Temp: 0 MEWS Systolic: 0 MEWS Pulse: 0 MEWS RR: 1 MEWS LOC: 0 MEWS Score: 1 MEWS Score Color: Green Assess: SIRS CRITERIA SIRS Temperature : 0 SIRS Respirations : 1 SIRS Pulse: 1 SIRS WBC: 0 SIRS Score Sum : 2 SIRS Temperature : 0 SIRS Pulse: 1 SIRS Respirations : 1 SIRS WBC: 0 SIRS Score Sum : 2 Assess: if the MEWS score is Yellow or Red Were vital signs accurate and taken at a resting state?: Yes Does the patient meet 2 or more of the SIRS criteria?: Yes Does the patient have a confirmed or suspected source of infection?: No MEWS guidelines implemented : Yes, red Treat MEWS Interventions: Considered administering scheduled or prn medications/treatments as ordered Take Vital Signs Increase Vital Sign Frequency : Red: Q1hr x2, continue Q4hrs until patient remains green for 12hrs Escalate MEWS: Escalate: Red: Discuss with charge nurse and notify provider. Consider notifying RRT. If remains red for 2 hours consider need for higher level of care Provider Notification Provider Name/Title: Dr. Ashok Pall Date Provider Notified: 08/30/23 Time Provider Notified: 1650 Method of Notification:  (securechat) Notification Reason: Critical Result Test performed and critical result: Lactic 2.3 Date Critical Result Received: 08/30/23 Time Critical Result Received: 1645 Provider response: No new orders Date of Provider Response: 08/30/23 Time of Provider Response: 1650      [name] E Ilario Dhaliwal 08/30/2023, 5:32 PM

## 2023-08-30 NOTE — Progress Notes (Signed)
Occupational Therapy Treatment Patient Details Name: MARENE TANJI MRN: [id number] DOB: 1941/02/11 Today's Date: 08/30/2023   History of present illness 82 yo female presents to [facility] on 11/27 with L weakness, code stroke. 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 PMH includes PMH of chronic systolic CHF, dilated cardiomyopathy, HTN, LBBB, and hypertensive heart disease, OA, tobacco abuse, ICD.   OT comments  Pt is making limited progress towards their acute OT goals. Pt seen with PT to progress OOB, however session limited by pt lethargy and increased assist needs. Pt with low LOA with minimal improvement once sitting EOB. Pt needed significant assist for sitting balance. Attempted BLE bathing ADL and adjusting sock to facilitate forward posture,  minimal carryover noted. Max/total A +2 needed for bed mobility and standing attempts with HHA +2. OT to continue to follow acutely to facilitate progress towards established goals. Pt will continue to benefit from intensive inpatient follow up therapy, >3 hours/day after discharge.         If plan is discharge home, recommend the following:  Two people to help with walking and/or transfers;A lot of help with walking and/or transfers;A lot of help with bathing/dressing/bathroom;Two people to help with bathing/dressing/bathroom;Assistance with cooking/housework;Direct supervision/assist for medications management;Direct supervision/assist for financial management;Assist for transportation;Help with stairs or ramp for entrance   Equipment Recommendations  Other (comment)       Precautions / Restrictions Precautions Precautions: Fall;ICD/Pacemaker Restrictions Weight  Bearing Restrictions: No       Mobility Bed Mobility Overal bed mobility: Needs Assistance Bed Mobility: Supine to Sit, Sit to Supine     Supine to sit: Max assist, +2 for physical assistance Sit to supine: Total assist, +2 for physical assistance, +2 for safety/equipment        Transfers Overall transfer level: Needs assistance Equipment used: 2 person hand held assist Transfers: Sit to/from Stand Sit to Stand: Max assist, +2 safety/equipment, +2 physical assistance           General transfer comment: increased assist needed and limited standing tolerance     Balance Overall balance assessment: Needs assistance, History of Falls Sitting-balance support: No upper extremity supported Sitting balance-Leahy Scale: Poor Sitting balance - Comments: posterior LOB even after working on hip flexion/forward bending Postural control: Posterior lean Standing balance support: Bilateral upper extremity supported, During functional activity, Reliant on assistive device for balance Standing balance-Leahy Scale: Poor                             ADL either performed or assessed with clinical judgement   ADL Overall ADL's : Needs assistance/impaired     Grooming: Maximal assistance Grooming Details (indicate cue type and reason): minimal attmpt at functionally participating in face washing task                 Toilet Transfer: Total assistance;+2 for physical assistance;+2 for safety/equipment Toilet Transfer Details (indicate cue type and reason): incrased assist needed with +2 adn increased lethargy         Functional mobility during ADLs: Total assistance;+2 for physical assistance;+2 for safety/equipment General ADL Comments: limited by lethargy, pt unable to sustain unsupported sitting balance. DId not hold  her head up. needed significant cues for any functional participation    Extremity/Trunk Assessment Upper Extremity Assessment Upper Extremity  Assessment: RUE deficits/detail;LUE deficits/detail RUE Deficits / Details: generalized weakness LUE Deficits / Details: weaker proximally. able to hold arm up against gravity for 10 seconds. grip strength is 3+/5. denies paraesthesias. poor coordination LUE Sensation: decreased light touch;decreased proprioception LUE Coordination: decreased fine motor;decreased gross motor   Lower Extremity Assessment Lower Extremity Assessment: Defer to PT evaluation        Vision   Vision Assessment?: No apparent visual deficits   Perception Perception Perception: Not tested   Praxis Praxis Praxis: Not tested    Cognition Arousal: Lethargic Behavior During Therapy: Flat affect Overall Cognitive Status: Impaired/Different from baseline Area of Impairment: Attention, Safety/judgement, Awareness, Problem solving, Following commands                   Current Attention Level: Sustained, Focused   Following Commands: Follows one step commands inconsistently, Follows one step commands with increased time   Awareness: Emergent Problem Solving: Slow processing, Decreased initiation, Difficulty sequencing, Requires verbal cues, Requires tactile cues General Comments: flat affect, not many verbalizations made. Not holding head upright in sitting. Answered yes/no questions with delayed response        Exercises      Shoulder Instructions       General Comments VSS, God-dtr present    Pertinent Vitals/ Pain       Pain Assessment Pain Assessment: Faces Faces Pain Scale: Hurts little more Pain Location: generalized Pain Descriptors / Indicators: Guarding, Grimacing Pain Intervention(s): Limited activity within patient's tolerance, Premedicated before session  Home Living                                          Prior Functioning/Environment              Frequency  Min 1X/week        Progress Toward Goals  OT Goals(current goals can now be found in  the care plan section)  Progress towards OT goals: Progressing toward goals  Acute Rehab OT Goals Patient Stated Goal: did not state OT Goal Formulation: With patient Time For Goal Achievement: 09/10/23 Potential to Achieve Goals: Good ADL Goals Pt Will Perform Grooming: with set-up Pt Will Perform Lower Body Dressing: with mod assist;sit to/from stand Pt Will Transfer to Toilet: with mod assist;stand pivot transfer;bedside commode Additional ADL Goal #1: Pt will complete bed mobility with min A as a precursor to ADLs  Plan      Co-evaluation                 AM-PAC OT "6 Clicks" Daily Activity     Outcome Measure   Help from another person eating meals?: A Little Help from another person taking care of personal grooming?: A Lot Help from another person toileting, which includes using toliet, bedpan, or urinal?: Total Help from another person bathing (including washing, rinsing, drying)?: A Lot Help from another person to put on and taking off regular upper body clothing?: A Lot Help from another person to put on and taking off regular lower body clothing?: Total 6 Click Score: 11    End of Session Equipment Utilized During Treatment: Gait belt  OT Visit Diagnosis: Unsteadiness on feet (R26.81);Other abnormalities of gait and mobility (R26.89);Muscle weakness (generalized) (M62.81);Repeated falls (R29.6);History of falling (Z91.81);Hemiplegia  and hemiparesis Hemiplegia - Right/Left: Left Hemiplegia - dominant/non-dominant: Non-Dominant   Activity Tolerance Patient limited by lethargy   Patient Left in bed;with call bell/phone within reach;with bed alarm set;with family/visitor present   Nurse Communication Mobility status        Time: 5409-8119 OT Time Calculation (min): 26 min  Charges: OT General Charges $OT Visit: 1 Visit OT Treatments $Therapeutic Activity: 8-22 mins  Derenda Mis, OTR/L Acute Rehabilitation Services Office [phone] Secure Chat  Communication Preferred   Donia Pounds 08/30/2023, 11:37 AM

## 2023-08-30 NOTE — Progress Notes (Signed)
Attempted to give pt one AM med crushed in applesauce and this RN had to suction it out of her mouth.  Pt lethargic, opens eyes to voice, but falls asleep right away.   Awaiting repeat head CT.  Daughter at the bedside, states pt is more difficult to understand than previously.

## 2023-08-30 NOTE — Progress Notes (Signed)
[facility]  Subjective  -patient states she has decided not to have surgery.  Does not feel she strong enough.   Objective (!) 140/50 88 99.4 F (37.4 C) (Axillary) (!) 22 (!) 88%  Intake/Output Summary (Last 24 hours) at 08/30/2023 0942 Last data filed at 08/29/2023 1200 Gross per 24 hour  Intake --  Output 200 ml  Net -200 ml   Sleepy but arousable Left facial droop Left upper extremity 4 out of 5 Left hip flexion 2 out of 5 Left dorsiflexion 3 out of 5 Right upper extremity 4 out of 5  Laboratory Lab Results: No results for input(s): "WBC", "HGB", "HCT", "PLT" in the last 72 hours. BMET Recent Labs    08/29/23 0735 08/30/23 0503  NA 138 141  K 3.8 3.4*  CL 105 114*  CO2 20* 19*  GLUCOSE 150* 142*  BUN 48* 69*  CREATININE 1.48* 1.43*  CALCIUM 10.8* 10.1    COAG Lab Results  Component Value Date   INR 1.1 08/25/2023   INR 1.00 10/29/2014   No results found for: "PTT"  Assessment/Planning:  82 year old female with a vascular surgery was consulted on Thursday for concern for symptomatic right ICA stenosis.  MRI showed right basal ganglia and corona radiata infarct with 2 insular infarcts. She had 50 to 60% right ICA stenosis by CTA and duplex. Neurology felt that her stroke was likely secondary to her carotid disease as an embolic stroke.   Ultimately I offered her right carotid endarterectomy that was scheduled for today.  I was notified last night the patient had decided not to have surgery.  I did leave her on the schedule and talked to her again this morning.  Patient feels she is not strong enough for surgery and does not want surgery today.  I do support the decision as she has looked quite frail through the weekend.  She has also developed an AKI yesterday.  Discussed we will allow her to get stronger and see her in the office in 1 month and further discuss carotid revascularization in the future.  She needs to remain on  aspirin statin Plavix for optimal medical management for risk reduction.  Cephus Shelling 08/30/2023 9:42 AM --

## 2023-08-30 NOTE — TOC Progression Note (Signed)
Transition of Care [facility]) - Progression Note    Patient Details  Name: Nicole Chan MRN: [id number] Date of Birth: 03-Nov-1940  [facility] CM/SW Contact  Kermit Balo, RN Phone Number: 08/30/2023, 1:13 PM  Clinical Narrative:     Pt with some changes and CT head pending. Pt has decided against CEA for now.  CIR following.   Expected Discharge Plan: IP Rehab Facility    Expected Discharge Plan and Services                                               Social Determinants of Health (SDOH) Interventions SDOH Screenings   Food Insecurity: No Food Insecurity (08/25/2023)  Housing: Low Risk  (08/25/2023)  Transportation Needs: No Transportation Needs (08/25/2023)  Utilities: Not At Risk (08/25/2023)  Social Connections: Unknown (02/10/2022)   [facility] from [facility]  Tobacco Use: High Risk (08/25/2023)    Readmission Risk Interventions     No data to display

## 2023-08-30 NOTE — Progress Notes (Signed)
Pharmacy Antibiotic Note  Nicole Chan is a 82 y.o. female admitted on 08/25/2023 with pneumonia.  Pharmacy has been consulted for unasyn dosing. CrCl ~ 35 mL/min  Plan: Unasyn 3G IV q6 hours  Weight: 89.6 kg (197 lb 8.5 oz)  Temp (24hrs), Avg:98.4 F (36.9 C), Min:97.7 F (36.5 C), Max:99.4 F (37.4 C)  Recent Labs  Lab 08/25/23 0823 08/25/23 0830 08/25/23 1535 08/25/23 1640 08/27/23 0459 08/28/23 0415 08/29/23 0735 08/30/23 0503 08/30/23 1437  WBC 13.9*  --   --  12.5*  --   --   --   --   --   CREATININE 0.98   < > 0.81  --  0.90 0.80 1.48* 1.43*  --   LATICACIDVEN  --   --   --   --   --   --   --   --  2.3*   < > = values in this interval not displayed.    Estimated Creatinine Clearance: 34.9 mL/min (A) (by C-G formula based on SCr of 1.43 mg/dL (H)).    Allergies  Allergen Reactions   Shellfish Allergy Itching     Thank you for allowing pharmacy to be a part of this patient's care.  Nicole Chan 08/30/2023 4:36 PM

## 2023-08-30 NOTE — Progress Notes (Signed)
[facility] PROGRESS NOTE    Progress Note  Nicole Chan  WGN:[id number] DOB: Sep 10, 1941 DOA: 08/25/2023 PCP: Andi Devon, MD   Brief Narrative:   Nicole Chan is an 82 y.o. female past medical history significant for essential hypertension, was of the history was obtained from the chart and son at bedside.  He was found on the floor at bedside at 7:30 AM on the day of admission, last time known to be normal was 10 PM the night before.  She smelled like urine was having dysarthria and right arm weakness with poor mentation.  CTA of the head showed no acute findings, extensive chronic small vessel disease. MRI of the brain confirmed acute CVA. Transthoracic Echo showed an EF of 50%, no wall motion abnormality.    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  PT evaluated the patient will need inpatient rehab, CT angio of the head and neck showed bilateral 50% stenosis ICA Now on DAPT therapy   Carotid Doppler showed greater than 50% stenosis of the right ICA and 40 to 50% left ICA.  CTA of the head showed more than 50% proximal ICA bilateral stenosis at the neck. Vascular surgery planned CEA today but patient has declined that so they are now advising f/u with them in one month This morning worsened swallow function and mentation, STAT head CT ordered  Dysphagia 2/2 stroke, slp advises re-starting NG tube feeds which have been ordered  Incidental subclavian artery thrombosis: Seen on CT head and neck that showed moderate filling defect nonocclusive. Upper extremity ultrasound showed 50 to 75% stenosis of the mid distal subclavian artery and 75 to 99% stenosis in the axillary artery. Further management per vascular surgery.  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  No underlying  edema.  Will need a 59-month follow-up. SPEP and UPEP pending, this is low yield in the setting of normal renal function and unremarkable hemoglobin  Acute kidney injury: Possibly due to poor oral intake hold hydrochlorothiazide and ARB. Will re-start IV fluids  Essential hypertension: She was on Coreg and Avapro. Bp mild elevation today Given aki will stop spiro, will continue coreg and will increase amlodipine to 10 Blood pressure is trending up, yesterday she was started on hydrochlorothiazide an  Mild hypercalcemia: Resolved with IV fluids.  Likely due to dehydration.  Leukocytosis: Likely reactive has remained afebrile, now resolved. Tmax 99.3.  Tobacco abuse: She has been counseled family has been informed that she needs to quit smoking.   DVT prophylaxis: lovenox Family Communication: daughter updated @ bedside 12/2 Status is: Inpatient Remains inpatient appropriate because: Acute CVA    Code Status:     Code Status Orders  (From admission, onward)           Start     Ordered   08/25/23 1315  Full code  Continuous       Question:  By:  Answer:  Other   08/25/23 1315           Code Status History     Date Active Date Inactive Code Status Order ID Comments User Context   07/11/2015 1536 07/12/2015 1216 Full Code [id number]  Marinus Maw, MD Inpatient   10/29/2014 1338 10/29/2014 1912 Full Code [id number]  Lyn Records, MD Inpatient   10/24/2014 1937 10/26/2014  2243 Full Code [id number]  Nahser, Deloris Ping, MD Inpatient         IV Access:   Peripheral IV   Procedures and diagnostic studies:   No results found.   Medical Consultants:   None.   Subjective:    Reports no pain. Less interactive today per RN  Objective:    Vitals:   08/30/23 0317 08/30/23 0355 08/30/23 0847 08/30/23 1117  BP: (!) 146/76 (!) 153/73 (!) 140/50 (!) 150/63  Pulse: 79  88 82  Resp: 18  (!) 22 20  Temp: 97.8 F (36.6 C) 97.7 F (36.5 C) 99.4 F (37.4 C)  98.6 F (37 C)  TempSrc: Oral Oral Axillary Axillary  SpO2: (!) 88%  (!) 88% (!) 86%  Weight:       SpO2: (!) 86 %  No intake or output data in the 24 hours ending 08/30/23 1355  Filed Weights   08/25/23 0800  Weight: 89.6 kg    Exam: General exam: In no acute distress. Respiratory system: rales at bases otherwise clear Cardiovascular system: S1 & S2 heard, RRR.   Gastrointestinal system: Abdomen is nondistended, obese, soft and nontender.  Extremities: No pedal edema. Skin: No visible rashes, lesions or ulcers Psychiatry: some confusion, slow to respond  Data Reviewed:    Labs: Basic Metabolic Panel: Recent Labs  Lab 08/25/23 0823 08/25/23 0830 08/25/23 1535 08/26/23 0444 08/27/23 0459 08/28/23 0415 08/29/23 0735 08/30/23 0503  NA 138 141  --   --  140 137 138 141  K 3.7 3.7  --   --  3.3* 3.6 3.8 3.4*  CL 106 108  --   --  108 107 105 114*  CO2 19*  --   --   --  23 23 20* 19*  GLUCOSE 169* 170*  --   --  114* 158* 150* 142*  BUN 17 17  --   --  18 19 48* 69*  CREATININE 0.98 0.90 0.81  --  0.90 0.80 1.48* 1.43*  CALCIUM 10.4*  --   --   --  9.8 10.0 10.8* 10.1  PHOS  --   --   --  3.1  --   --   --   --    GFR Estimated Creatinine Clearance: 34.9 mL/min (A) (by C-G formula based on SCr of 1.43 mg/dL (H)). Liver Function Tests: Recent Labs  Lab 08/25/23 0823  AST 27  ALT 23  ALKPHOS 73  BILITOT 1.5*  PROT 6.8  ALBUMIN 3.6   No results for input(s): "LIPASE", "AMYLASE" in the last 168 hours. No results for input(s): "AMMONIA" in the last 168 hours. Coagulation profile Recent Labs  Lab 08/25/23 0823  INR 1.1   COVID-19 Labs  No results for input(s): "DDIMER", "FERRITIN", "LDH", "CRP" in the last 72 hours.  Lab Results  Component Value Date   SARSCOV2NAA Not Detected 04/12/2019    CBC: Recent Labs  Lab 08/25/23 0823 08/25/23 0830 08/25/23 1640  WBC 13.9*  --  12.5*  NEUTROABS 11.6*  --   --   HGB 14.9 15.6* 14.3  HCT 45.3 46.0  42.3  MCV 88.6  --  86.5  PLT 226  --  217   Cardiac Enzymes: Recent Labs  Lab 08/25/23 1535  CKTOTAL 144   BNP (last 3 results) No results for input(s): "PROBNP" in the last 8760 hours. CBG: Recent Labs  Lab 08/25/23 0827 08/25/23 1658 08/26/23 2132 08/27/23 1201 08/27/23 1608  GLUCAP 159* 168*  140* 140* 144*   D-Dimer: No results for input(s): "DDIMER" in the last 72 hours. Hgb A1c: No results for input(s): "HGBA1C" in the last 72 hours.  Lipid Profile: No results for input(s): "CHOL", "HDL", "LDLCALC", "TRIG", "CHOLHDL", "LDLDIRECT" in the last 72 hours.  Thyroid function studies: No results for input(s): "TSH", "T4TOTAL", "T3FREE", "THYROIDAB" in the last 72 hours.  Invalid input(s): "FREET3" Anemia work up: No results for input(s): "VITAMINB12", "FOLATE", "FERRITIN", "TIBC", "IRON", "RETICCTPCT" in the last 72 hours. Sepsis Labs: Recent Labs  Lab 08/25/23 0823 08/25/23 1640  WBC 13.9* 12.5*   Microbiology No results found for this or any previous visit (from the past 240 hour(s)).   Medications:    amLODipine  5 mg Oral Daily   aspirin EC  81 mg Oral Daily   atorvastatin  80 mg Oral Daily   carvedilol  12.5 mg Oral BID WC   clopidogrel  75 mg Oral Daily   enoxaparin (LOVENOX) injection  40 mg Subcutaneous Q24H   mouth rinse  15 mL Mouth Rinse 4 times per day   Continuous Infusions:  dextrose 5 % and 0.45 % NaCl         LOS: 5 days   Silvano Bilis  [facility]  08/30/2023, 1:55 PM

## 2023-08-30 NOTE — Progress Notes (Signed)
Physical Therapy Treatment Patient Details Name: Nicole Chan MRN: [id number] DOB: 08/13/1941 Today's Date: 08/30/2023   History of Present Illness 82 yo female presents to [facility] on 11/27 with L weakness, code stroke. 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 PMH includes PMH of chronic systolic CHF, dilated cardiomyopathy, HTN, LBBB, and hypertensive heart disease, OA, tobacco abuse, ICD.    PT Comments  Patient less alert than previous sessions per OT. She required +2 assist for all mobility and unable to safely progress from bed to chair with +2 assist. Stood at EOB with trunk very forward flexed for max 45 seconds with +2 mod assist. Sitting balance with posterior lean and head hanging into flexion. Eyes open throughout session, but very limited attempts to vocalize (using head nods/shakes). Per family, pt often does not sleep well and "sleeps in" in the mornings. Will attempt to see late morning or afternoon next visit.     If plan is discharge home, recommend the following: A lot of help with bathing/dressing/bathroom;A lot of help with walking and/or transfers   Can travel by private vehicle        Equipment Recommendations  None recommended by PT    Recommendations for Other Services       Precautions / Restrictions Precautions Precautions: Fall;ICD/Pacemaker Restrictions Weight Bearing Restrictions: No     Mobility  Bed Mobility Overal bed mobility: Needs Assistance Bed Mobility: Supine to Sit, Sit to Supine     Supine to sit: Max assist, +2 for physical assistance Sit to supine: Max assist, +2 for physical assistance   General bed mobility comments: trunk and le management     Transfers Overall transfer level: Needs assistance Equipment used: 2 person hand held assist Transfers: Sit to/from Stand Sit to Stand: Mod assist, +2 physical assistance, From elevated surface           General transfer comment: using momentum (as she did PTA); able to get knees extended, however trunk/hips forward flexed with torso nearly parallel to floor and head hanging down despite cues to stand upright and "look up"; repeated x 2 with max standing time 45 seconds    Ambulation/Gait               General Gait Details: unable   Stairs             Wheelchair Mobility     Tilt Bed    Modified Rankin (Stroke Patients Only) Modified Rankin (Stroke Patients Only) Pre-Morbid Rankin Score: No significant disability Modified Rankin: Severe disability     Balance Overall balance assessment: Needs assistance, History of Falls Sitting-balance support: No upper extremity supported Sitting balance-Leahy Scale: Poor Sitting balance - Comments: posterior LOB even after working on hip flexion/forward bending Postural control: Posterior lean Standing balance support: Bilateral upper extremity supported, During functional activity, Reliant on assistive device for balance Standing balance-Leahy Scale: Poor                              Cognition Arousal: Alert Behavior During Therapy: Flat affect Overall Cognitive Status: Impaired/Different from baseline Area of Impairment: Attention, Safety/judgement, Awareness, Problem solving, Following commands  Current Attention Level: Sustained, Focused   Following Commands: Follows one step commands inconsistently, Follows one step commands with increased time     Problem Solving: Slow processing, Decreased initiation, Difficulty sequencing, Requires verbal cues, Requires tactile cues General Comments: flat affect, not many verbalizations made. Not holding head upright in sitting         Exercises      General Comments General comments (skin integrity, edema, etc.): God-daughter arrived mid-session with no improved alertness or attempts to hold her head up to see her      Pertinent Vitals/Pain Pain Assessment Pain Assessment: No/denies pain    Home Living                          Prior Function            PT Goals (current goals can now be found in the care plan section) Acute Rehab PT Goals Patient Stated Goal: home Time For Goal Achievement: 09/10/23 Potential to Achieve Goals: Good Progress towards PT goals: Not progressing toward goals - comment (more lethargic)    Frequency    Min 1X/week      PT Plan      Co-evaluation              AM-PAC PT "6 Clicks" Mobility   Outcome Measure  Help needed turning from your back to your side while in a flat bed without using bedrails?: Total Help needed moving from lying on your back to sitting on the side of a flat bed without using bedrails?: Total Help needed moving to and from a bed to a chair (including a wheelchair)?: Total Help needed standing up from a chair using your arms (e.g., wheelchair or bedside chair)?: Total Help needed to walk in hospital room?: Total Help needed climbing 3-5 steps with a railing? : Total 6 Click Score: 6    End of Session Equipment Utilized During Treatment: Gait belt Activity Tolerance: Patient limited by fatigue;Patient limited by lethargy Patient left: in bed;with call bell/phone within reach;with bed alarm set;with family/visitor present   PT Visit Diagnosis: Unsteadiness on feet (R26.81);Muscle weakness (generalized) (M62.81)     Time: 1610-9604 PT Time Calculation (min) (ACUTE ONLY): 26 min  Charges:    $Neuromuscular Re-education: 8-22 mins PT General Charges $$ ACUTE PT VISIT: 1 Visit                      Jerolyn Center, PT Acute Rehabilitation Services  Office [phone]    Zena Amos 08/30/2023, 9:53 AM

## 2023-08-30 NOTE — Anesthesia Preprocedure Evaluation (Deleted)
Anesthesia Evaluation    Reviewed: Allergy & Precautions, Patient's Chart, lab work & pertinent test results  History of Anesthesia Complications Negative for: history of anesthetic complications  Airway        Dental   Pulmonary Current Smoker          Cardiovascular hypertension, Pt. on medications and Pt. on home beta blockers + Peripheral Vascular Disease and +CHF  + Cardiac Defibrillator    '24 TTE - There is apical LV cavity systolic obliteration with a dynamic "gradient" of up to 2 m/s. There is no evidence of LV outflow obstruction and there is no systolic anterior motion of the mitral valve at rest or with the Valsalva maneuver. EF 45 to 50%. The left ventricle has mildly decreased function. There is moderate asymmetric left ventricular hypertrophy of the basal-septal and apical segments. Indeterminate diastolic filling due to E-A fusion. Aortic valve regurgitation is trivial.   '24 Carotid US - 40-59% b/l ICAS    Neuro/Psych  Hx meningioma   CVA  negative psych ROS   GI/Hepatic negative GI ROS, Neg liver ROS,,,  Endo/Other  negative endocrine ROS    Renal/GU CRFRenal disease     Musculoskeletal  (+) Arthritis ,    Abdominal   Peds  Hematology  On plavix    Anesthesia Other Findings   Reproductive/Obstetrics                             Anesthesia Physical Anesthesia Plan  ASA: 4  Anesthesia Plan: General   Post-op Pain Management: Tylenol PO (pre-op)*   Induction: Intravenous  PONV Risk Score and Plan: 3 and Treatment may vary due to age or medical condition, Ondansetron and TIVA  Airway Management Planned: Oral ETT  Additional Equipment: Arterial line  Intra-op Plan:   Post-operative Plan: Extubation in OR  Informed Consent:   Plan Discussed with: CRNA and Anesthesiologist  Anesthesia Plan Comments:        Anesthesia Quick Evaluation

## 2023-08-30 NOTE — Consult Note (Signed)
NAME:  Nicole Chan, MRN:  [id number], DOB:  [birth year], LOS: 5 ADMISSION DATE:  08/25/2023, CONSULTATION DATE:  10/30/2022 REFERRING MD: Dr. Ashok Pall, CHIEF COMPLAINT: Respiratory failure  History of Present Illness:  This is a 82 year old female history of nonischemic cardiomyopathy, hypertension, stroke.  Patient had presentation to the hospital of stroke CTA of the head with no acute findings, MRI of brain confirmed an acute CVA.  She had transthoracic echo with an EF of 50%.  She had a right stroke in the corona radiata and 2 small insular cortex infarcts with left-sided weakness.  She suffered dysphagia.  She has a core track placed she has been working with PT.  Over the past 24 hours she has had mental status change  Pertinent  Medical History   Past Medical History:  Diagnosis Date   Arthritis    "maybe in my hands/fingers; not terrible" (07/12/2015)   Chronic systolic CHF (congestive heart failure) (HCC)    Dilated cardiomyopathy (HCC)    Episode of dizziness, secondary to cardiomyopathy possibly  10/25/2014   Essential hypertension    Hypertensive heart disease    LBBB (left bundle branch block)    a. noted on 10/24/14 admission for dizziness    NICM (nonischemic cardiomyopathy) (HCC)    a. Cath 10/29/14: normal coronary arteries (tortuous, compatible with hypertension). b. s/p [employer] BiV-ICD 06/2015.   Tobacco abuse      Significant Hospital Events: Including procedures, antibiotic start and stop dates in addition to other pertinent events     Interim History / Subjective:  Per HPI above  Objective   Blood pressure (!) 110/54, pulse 95, temperature 98.6 F (37 C), temperature source Axillary, resp. rate (!) 24, weight 89.6 kg, SpO2 95%.        Intake/Output Summary (Last 24 hours) at 08/30/2023 1627 Last data filed at 08/30/2023 1330 Gross per 24 hour  Intake 0 ml  Output 200 ml  Net -200 ml   Filed Weights   08/25/23 0800  Weight: 89.6 kg     Examination: General: Elderly female appears, tachypneic HENT: Core track in place Lungs: Rhonchi bilaterally Cardiovascular: Regular rate rhythm S1-S2 Abdomen: Mildly distended Extremities: No significant edema Neuro: Left-sided weakness withdraws to pain on the right GU: Deferred  Resolved Hospital Problem list     Assessment & Plan:   Acute hypoxemic respiratory failure concern for aspiration Acute right-sided CVA, left-sided weakness Dysphagia Subclavian artery stenosis Incidental meningioma in the left front AKI Plan: Start Unasyn therapy Chest x-ray reviewed Head CT completed to make sure there is no change that would cause mental status Currently on 10 L nasal cannula O2 sats in the low 90s Started discussion with family regarding whether or not she would want to be placed on mechanical support if she got to the point where she needed breathing assistance. She would like to talk with her brothers before making a decision. I think it is stable for her to stay on the floor      Labs   CBC: Recent Labs  Lab 08/25/23 0823 08/25/23 0830 08/25/23 1640  WBC 13.9*  --  12.5*  NEUTROABS 11.6*  --   --   HGB 14.9 15.6* 14.3  HCT 45.3 46.0 42.3  MCV 88.6  --  86.5  PLT 226  --  217    Basic Metabolic Panel: Recent Labs  Lab 08/25/23 0823 08/25/23 0830 08/25/23 1535 08/26/23 0444 08/27/23 0459 08/28/23 0415 08/29/23 0735 08/30/23 0503 08/30/23 1437  NA 138 141  --   --  140 137 138 141  --   K 3.7 3.7  --   --  3.3* 3.6 3.8 3.4*  --   CL 106 108  --   --  108 107 105 114*  --   CO2 19*  --   --   --  23 23 20* 19*  --   GLUCOSE 169* 170*  --   --  114* 158* 150* 142*  --   BUN 17 17  --   --  18 19 48* 69*  --   CREATININE 0.98 0.90 0.81  --  0.90 0.80 1.48* 1.43*  --   CALCIUM 10.4*  --   --   --  9.8 10.0 10.8* 10.1  --   MG  --   --   --   --   --   --   --   --  1.9  PHOS  --   --   --  3.1  --   --   --   --  3.5   GFR: Estimated Creatinine  Clearance: 34.9 mL/min (A) (by C-G formula based on SCr of 1.43 mg/dL (H)). Recent Labs  Lab 08/25/23 0823 08/25/23 1640 08/30/23 1437  WBC 13.9* 12.5*  --   LATICACIDVEN  --   --  2.3*    Liver Function Tests: Recent Labs  Lab 08/25/23 0823  AST 27  ALT 23  ALKPHOS 73  BILITOT 1.5*  PROT 6.8  ALBUMIN 3.6   No results for input(s): "LIPASE", "AMYLASE" in the last 168 hours. No results for input(s): "AMMONIA" in the last 168 hours.  ABG    Component Value Date/Time   PHART 7.42 08/30/2023 1434   PCO2ART 30 (L) 08/30/2023 1434   PO2ART 60 (L) 08/30/2023 1434   HCO3 19.5 (L) 08/30/2023 1434   TCO2 22 08/25/2023 0830   ACIDBASEDEF 3.9 (H) 08/30/2023 1434   O2SAT 89.5 08/30/2023 1434     Coagulation Profile: Recent Labs  Lab 08/25/23 0823  INR 1.1    Cardiac Enzymes: Recent Labs  Lab 08/25/23 1535  CKTOTAL 144    HbA1C: Hgb A1c MFr Bld  Date/Time Value Ref Range Status  08/25/2023 04:40 PM 5.4 4.8 - 5.6 % Final    Comment:    (NOTE) Pre diabetes:          5.7%-6.4%  Diabetes:              >6.4%  Glycemic control for   <7.0% adults with diabetes     CBG: Recent Labs  Lab 08/25/23 1658 08/26/23 2132 08/27/23 1201 08/27/23 1608 08/30/23 1447  GLUCAP 168* 140* 140* 144* 156*    Review of Systems:   Review of Systems  Constitutional:  Negative for chills, fever, malaise/fatigue and weight loss.  HENT:  Negative for hearing loss, sore throat and tinnitus.   Eyes:  Negative for blurred vision and double vision.  Respiratory:  Positive for cough and shortness of breath. Negative for hemoptysis, sputum production, wheezing and stridor.   Cardiovascular:  Negative for chest pain, palpitations, orthopnea, leg swelling and PND.  Gastrointestinal:  Negative for abdominal pain, constipation, diarrhea, heartburn, nausea and vomiting.  Genitourinary:  Negative for dysuria, hematuria and urgency.  Musculoskeletal:  Negative for joint pain and myalgias.   Skin:  Negative for itching and rash.  Neurological:  Positive for weakness. Negative for dizziness, tingling and headaches.  Endo/Heme/Allergies:  Negative for  environmental allergies. Does not bruise/bleed easily.  Psychiatric/Behavioral:  Negative for depression. The patient is not nervous/anxious and does not have insomnia.   All other systems reviewed and are negative.    Past Medical History:  She,  has a past medical history of Arthritis, Chronic systolic CHF (congestive heart failure) (HCC), Dilated cardiomyopathy (HCC), Episode of dizziness, secondary to cardiomyopathy possibly  (10/25/2014), Essential hypertension, Hypertensive heart disease, LBBB (left bundle branch block), NICM (nonischemic cardiomyopathy) (HCC), and Tobacco abuse.   Surgical History:   Past Surgical History:  Procedure Laterality Date   ABDOMINAL HYSTERECTOMY  1970's   APPENDECTOMY     BI-VENTRICULAR IMPLANTABLE CARDIOVERTER DEFIBRILLATOR  (CRT-D)  07/11/2015   BIV ICD GENERATOR CHANGEOUT N/A 03/15/2020   Procedure: BIV ICD GENERATOR CHANGEOUT;  Surgeon: Marinus Maw, MD;  Location: [facility] INVASIVE CV LAB;  Service: Cardiovascular;  Laterality: N/A;   CARDIAC CATHETERIZATION     EP IMPLANTABLE DEVICE N/A 07/11/2015   Procedure: BiV ICD Insertion CRT-D;  Surgeon: Marinus Maw, MD;  Location: [facility] INVASIVE CV LAB;  Service: Cardiovascular;  Laterality: N/A;   LEFT HEART CATHETERIZATION WITH CORONARY ANGIOGRAM N/A 10/29/2014   Procedure: LEFT HEART CATHETERIZATION WITH CORONARY ANGIOGRAM;  Surgeon: Lesleigh Noe, MD;  Location: [facility] CATH LAB;  Service: Cardiovascular;  Laterality: N/A;   TONSILLECTOMY     TUBAL LIGATION  1970's     Social History:   reports that she has been smoking cigarettes. She has a 27.5 pack-year smoking history. She has never used smokeless tobacco. She reports that she does not drink alcohol and does not use drugs.   Family History:  Her family history includes Arrhythmia in her  mother; Cancer in her father; Diabetes in her mother; Healthy in her paternal aunt; Heart disease in her mother; Hypertension in her mother; Lung cancer in her sister. There is no history of Heart attack or Stroke.   Allergies Allergies  Allergen Reactions   Shellfish Allergy Itching     Home Medications  Prior to Admission medications   Medication Sig Start Date End Date Taking? Authorizing Provider  atorvastatin (LIPITOR) 20 MG tablet Take 20 mg by mouth daily.   Yes [provider]  carvedilol (COREG) 12.5 MG tablet Take 1 tablet (12.5 mg total) by mouth 2 (two) times daily with a meal. 07/19/23  Yes Marinus Maw, MD  irbesartan (AVAPRO) 300 MG tablet Take 1 tablet (300 mg total) by mouth daily. 04/03/21  Yes Jacalyn Lefevre, MD  Vitamin D, Ergocalciferol, (DRISDOL) 50000 UNITS CAPS capsule Take 50,000 Units by mouth 2 (two) times a week.    Yes [provider]     Josephine Igo, DO  Pulmonary Critical Care 08/30/2023 4:40 PM

## 2023-08-30 NOTE — Procedures (Signed)
Cortrak  Person Inserting Tube:  Maylon Peppers C, RD Tube Type:  Cortrak - 43 inches Tube Size:  10 Tube Location:  Left nare Secured by: Bridle Technique Used to Measure Tube Placement:  Marking at nare/corner of mouth Cortrak Secured At:  62 cm   Cortrak Tube Team Note:  Consult received to place a Cortrak feeding tube.   X-ray is required, abdominal x-ray has been ordered by the Cortrak team. Please confirm tube placement before using the Cortrak tube.   If the tube becomes dislodged please keep the tube and contact the Cortrak team at [email or web address] for replacement.  If after hours and replacement cannot be delayed, place a NG tube and confirm placement with an abdominal x-ray.    Lockie Pares., RD, LDN, CNSC See AMiON for contact information

## 2023-08-30 NOTE — Plan of Care (Signed)

## 2023-08-30 NOTE — Significant Event (Addendum)
Rapid Response Event Note   Reason for Call :  Increase in O2 needs, RA to 10L HFNC  Initial Focused Assessment:  Patient lying in bed, briefly opens eyes to command. Oriented to person only, dyspneic answering/attempting to answer questions with one word answers. Labored breathing, R>L rhonchus lung sounds. Skin warm/dry. Some edema present.   100/43 (61) HR 98 RR 38 O2 91% 10L HFNC  Temp 98.6 CBG 156  Interventions/Plan of Care:  ABG sent by RT [facility] ordered this AM due to mentation changes; facilitated getting patient down MD Wouk to bedside CCM consulted by Primary, to bedside after CT  Event Summary:  MD Notified: Alfonso Patten MD Call Time: 1436 Arrival Time: 1440 End Time: 1535  Truddie Crumble, RN

## 2023-08-31 ENCOUNTER — Inpatient Hospital Stay (HOSPITAL_COMMUNITY): Payer: Medicare Other

## 2023-08-31 ENCOUNTER — Other Ambulatory Visit: Payer: Self-pay

## 2023-08-31 DIAGNOSIS — I6523 Occlusion and stenosis of bilateral carotid arteries: Secondary | ICD-10-CM | POA: Diagnosis not present

## 2023-08-31 DIAGNOSIS — T17908A Unspecified foreign body in respiratory tract, part unspecified causing other injury, initial encounter: Secondary | ICD-10-CM

## 2023-08-31 DIAGNOSIS — Z66 Do not resuscitate: Secondary | ICD-10-CM

## 2023-08-31 DIAGNOSIS — I63012 Cerebral infarction due to thrombosis of left vertebral artery: Secondary | ICD-10-CM

## 2023-08-31 DIAGNOSIS — I639 Cerebral infarction, unspecified: Secondary | ICD-10-CM | POA: Diagnosis not present

## 2023-08-31 DIAGNOSIS — Z515 Encounter for palliative care: Secondary | ICD-10-CM

## 2023-08-31 DIAGNOSIS — R4182 Altered mental status, unspecified: Secondary | ICD-10-CM | POA: Diagnosis not present

## 2023-08-31 DIAGNOSIS — D72829 Elevated white blood cell count, unspecified: Secondary | ICD-10-CM | POA: Diagnosis not present

## 2023-08-31 DIAGNOSIS — R569 Unspecified convulsions: Secondary | ICD-10-CM

## 2023-08-31 DIAGNOSIS — I63231 Cerebral infarction due to unspecified occlusion or stenosis of right carotid arteries: Secondary | ICD-10-CM | POA: Diagnosis not present

## 2023-08-31 DIAGNOSIS — J9601 Acute respiratory failure with hypoxia: Secondary | ICD-10-CM | POA: Diagnosis not present

## 2023-08-31 LAB — CBC WITH DIFFERENTIAL/PLATELET
Abs Immature Granulocytes: 0.06 10*3/uL (ref 0.00–0.07)
Basophils Absolute: 0 10*3/uL (ref 0.0–0.1)
Basophils Relative: 0 %
Eosinophils Absolute: 0 10*3/uL (ref 0.0–0.5)
Eosinophils Relative: 0 %
HCT: 33.7 % — ABNORMAL LOW (ref 36.0–46.0)
Hemoglobin: 10.9 g/dL — ABNORMAL LOW (ref 12.0–15.0)
Immature Granulocytes: 1 %
Lymphocytes Relative: 10 %
Lymphs Abs: 1.2 10*3/uL (ref 0.7–4.0)
MCH: 28.9 pg (ref 26.0–34.0)
MCHC: 32.3 g/dL (ref 30.0–36.0)
MCV: 89.4 fL (ref 80.0–100.0)
Monocytes Absolute: 0.4 10*3/uL (ref 0.1–1.0)
Monocytes Relative: 4 %
Neutro Abs: 9.8 10*3/uL — ABNORMAL HIGH (ref 1.7–7.7)
Neutrophils Relative %: 85 %
Platelets: 186 10*3/uL (ref 150–400)
RBC: 3.77 MIL/uL — ABNORMAL LOW (ref 3.87–5.11)
RDW: 14 % (ref 11.5–15.5)
WBC: 11.5 10*3/uL — ABNORMAL HIGH (ref 4.0–10.5)
nRBC: 0 % (ref 0.0–0.2)

## 2023-08-31 LAB — AMMONIA: Ammonia: 64 umol/L — ABNORMAL HIGH (ref 9–35)

## 2023-08-31 LAB — COMPREHENSIVE METABOLIC PANEL
ALT: 17 U/L (ref 0–44)
AST: 19 U/L (ref 15–41)
Albumin: 1.8 g/dL — ABNORMAL LOW (ref 3.5–5.0)
Alkaline Phosphatase: 31 U/L — ABNORMAL LOW (ref 38–126)
Anion gap: 10 (ref 5–15)
BUN: 97 mg/dL — ABNORMAL HIGH (ref 8–23)
CO2: 18 mmol/L — ABNORMAL LOW (ref 22–32)
Calcium: 9.7 mg/dL (ref 8.9–10.3)
Chloride: 112 mmol/L — ABNORMAL HIGH (ref 98–111)
Creatinine, Ser: 1.75 mg/dL — ABNORMAL HIGH (ref 0.44–1.00)
GFR, Estimated: 29 mL/min — ABNORMAL LOW (ref >60)
Glucose, Bld: 144 mg/dL — ABNORMAL HIGH (ref 70–99)
Potassium: 3.5 mmol/L (ref 3.5–5.1)
Sodium: 140 mmol/L (ref 135–145)
Total Bilirubin: 2.7 mg/dL — ABNORMAL HIGH (ref <1.2)
Total Protein: 4.6 g/dL — ABNORMAL LOW (ref 6.5–8.1)

## 2023-08-31 LAB — URINALYSIS, MICROSCOPIC (REFLEX)
RBC / HPF: >50 RBC/hpf (ref 0–5)
WBC, UA: >50 WBC/hpf (ref 0–5)

## 2023-08-31 LAB — URINALYSIS, ROUTINE W REFLEX MICROSCOPIC

## 2023-08-31 LAB — GLUCOSE, CAPILLARY
Glucose-Capillary: 123 mg/dL — ABNORMAL HIGH (ref 70–99)
Glucose-Capillary: 125 mg/dL — ABNORMAL HIGH (ref 70–99)
Glucose-Capillary: 132 mg/dL — ABNORMAL HIGH (ref 70–99)
Glucose-Capillary: 160 mg/dL — ABNORMAL HIGH (ref 70–99)
Glucose-Capillary: 189 mg/dL — ABNORMAL HIGH (ref 70–99)

## 2023-08-31 LAB — MAGNESIUM: Magnesium: 1.7 mg/dL (ref 1.7–2.4)

## 2023-08-31 LAB — BRAIN NATRIURETIC PEPTIDE: B Natriuretic Peptide: 209.9 pg/mL — ABNORMAL HIGH (ref 0.0–100.0)

## 2023-08-31 LAB — LACTIC ACID, PLASMA: Lactic Acid, Venous: 2.5 mmol/L (ref 0.5–1.9)

## 2023-08-31 LAB — PROCALCITONIN: Procalcitonin: 8.26 ng/mL

## 2023-08-31 LAB — PHOSPHORUS: Phosphorus: 2.9 mg/dL (ref 2.5–4.6)

## 2023-08-31 MED ORDER — IPRATROPIUM-ALBUTEROL 0.5-2.5 (3) MG/3ML IN SOLN
3.0000 mL | RESPIRATORY_TRACT | Status: DC | PRN
  Administered 2023-09-01: 3 mL via RESPIRATORY_TRACT
  Filled 2023-08-31 (×2): qty 3

## 2023-08-31 MED ORDER — HYDRALAZINE HCL 20 MG/ML IJ SOLN: 10.0000 mg | INTRAMUSCULAR | Status: DC | PRN

## 2023-08-31 MED ORDER — SODIUM CHLORIDE 0.9 % IV BOLUS
500.0000 mL | Freq: Once | INTRAVENOUS | Status: AC
  Administered 2023-08-31: 500 mL via INTRAVENOUS

## 2023-08-31 MED ORDER — LACTATED RINGERS IV BOLUS
1000.0000 mL | Freq: Once | INTRAVENOUS | Status: AC
  Administered 2023-08-31: 1000 mL via INTRAVENOUS

## 2023-08-31 MED ORDER — GUAIFENESIN 100 MG/5ML PO LIQD: 5.0000 mL | ORAL | Status: DC | PRN

## 2023-08-31 MED ORDER — NICOTINE 21 MG/24HR TD PT24
21.0000 mg | MEDICATED_PATCH | Freq: Every day | TRANSDERMAL | Status: DC
  Administered 2023-08-31 – 2023-09-01 (×2): 21 mg via TRANSDERMAL
  Filled 2023-08-31 (×2): qty 1

## 2023-08-31 MED ORDER — DEXTROSE-SODIUM CHLORIDE 5-0.45 % IV SOLN: INTRAVENOUS | Status: AC

## 2023-08-31 MED ORDER — FREE WATER
100.0000 mL | Freq: Four times a day (QID) | Status: DC
  Administered 2023-08-31 – 2023-09-01 (×3): 100 mL

## 2023-08-31 MED ORDER — TRAZODONE HCL 50 MG PO TABS: 50.0000 mg | ORAL_TABLET | Freq: Every evening | ORAL | Status: DC | PRN

## 2023-08-31 MED ORDER — CHLORHEXIDINE GLUCONATE CLOTH 2 % EX PADS
6.0000 | MEDICATED_PAD | Freq: Every day | CUTANEOUS | Status: DC
  Administered 2023-08-31 – 2023-09-01 (×2): 6 via TOPICAL

## 2023-08-31 MED ORDER — SODIUM CHLORIDE 0.9 % IV SOLN
3.0000 g | Freq: Three times a day (TID) | INTRAVENOUS | Status: DC
  Administered 2023-08-31: 3 g via INTRAVENOUS
  Filled 2023-08-31: qty 8

## 2023-08-31 MED ORDER — LACTULOSE 10 GM/15ML PO SOLN
20.0000 g | Freq: Two times a day (BID) | ORAL | Status: DC
  Administered 2023-08-31 – 2023-09-01 (×3): 20 g via ORAL
  Filled 2023-08-31 (×3): qty 30

## 2023-08-31 MED ORDER — VITAMIN D (ERGOCALCIFEROL) 1.25 MG (50000 UNIT) PO CAPS
50000.0000 [IU] | ORAL_CAPSULE | ORAL | Status: DC
  Filled 2023-08-31: qty 1

## 2023-08-31 MED ORDER — METOPROLOL TARTRATE 5 MG/5ML IV SOLN: 5.0000 mg | INTRAVENOUS | Status: DC | PRN

## 2023-08-31 MED ORDER — PIPERACILLIN-TAZOBACTAM 3.375 G IVPB
3.3750 g | Freq: Three times a day (TID) | INTRAVENOUS | Status: DC
  Administered 2023-08-31 – 2023-09-01 (×3): 3.375 g via INTRAVENOUS
  Filled 2023-08-31 (×5): qty 50

## 2023-08-31 MED ORDER — OSMOLITE 1.5 CAL PO LIQD
1000.0000 mL | ORAL | Status: DC
  Administered 2023-08-31: 1000 mL

## 2023-08-31 MED ORDER — VITAMIN D 25 MCG (1000 UNIT) PO TABS
1000.0000 [IU] | ORAL_TABLET | Freq: Every day | ORAL | Status: DC
  Administered 2023-08-31 – 2023-09-01 (×2): 1000 [IU]
  Filled 2023-08-31 (×2): qty 1

## 2023-08-31 NOTE — Plan of Care (Signed)
  Problem: Education: Goal: Knowledge of disease or condition will improve Outcome: Progressing Goal: Knowledge of secondary prevention will improve (MUST DOCUMENT ALL) Outcome: Progressing Goal: Knowledge of patient specific risk factors will improve Loraine Leriche N/A or DELETE if not current risk factor) Outcome: Progressing   Problem: Nutrition: Goal: Dietary intake will improve Outcome: Progressing   Problem: Clinical Measurements: Goal: Ability to maintain clinical measurements within normal limits will improve Outcome: Progressing Goal: Diagnostic test results will improve Outcome: Progressing

## 2023-08-31 NOTE — Progress Notes (Signed)
PCCM:   I spoke with Dr. Nelson Chimes. He is going to continue family discussions regarding goc and get back to [place] if we can be of assistance.   Josephine Igo, DO [facility] 08/31/2023 10:06 AM

## 2023-08-31 NOTE — Progress Notes (Signed)
RT placed pt on NRB d/t desat to 85% on 15L salter. RN and MD made aware. RT awaiting MD orders, will continue to monitor as needed.

## 2023-08-31 NOTE — Progress Notes (Signed)
Inpatient Rehab Admissions Coordinator:   Following for GOC.   Estill Dooms, PT, DPT Admissions Coordinator [phone] 08/31/23  10:42 AM

## 2023-08-31 NOTE — Hospital Course (Addendum)
Brief Narrative:  82 y.o. female past medical history significant for essential hypertension, was of the history was obtained from the chart and son at bedside.  He was found on the floor at bedside at 7:30 AM on the day of admission, last time known to be normal was 10 PM the night before.  She smelled like urine was having dysarthria and right arm weakness with poor mentation.  CTA of the head showed no acute findings, extensive chronic small vessel disease. MRI of the brain confirmed acute CVA. Transthoracic Echo showed an EF of 50%, no wall motion abnormality.  Over the last 24 hours she has had mental status decline therefore PCCM consulted.  There is also concerns of dysphagia therefore started on Unasyn to cover for aspiration.   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  A1c 5.6, LDL 141 on statin.  CTA head and neck showed bilateral 50% ICA stenosis.  Vascular planning CEA but patient declined.  Echo shows EF 40-45%. -Neurology recommended DAPT  Acute change in mental status Possible urinary tract infection Elevated ammonia - Evolving prior infarct noted on CT 12/2 which was initially seen on MRI on 11/27 - Procalcitonin elevated, will broaden antibiotics to Zosyn.  Should very well cover for urinary tract infection as well.  Lactulose twice daily for elevated ammonia -Neuro will reevaluate. Will order EEG.    Dysphagia 2/2 stroke, slp advises re-starting NG tube feeds which have been ordered Empiric Abx  Acute kidney injury/uremia -Baseline creatinine 0.8 has been rising over the last 3 days.   Creatinine today 1.75 also elevated BUN.  Continue IV fluids    Incidental meningioma/incidental 10 mm osseous lesion in the left frontal calvarium: Seen on MRI 9 mm right anterior flax.  No underlying edema.  Will need a 30-month follow-up. SPEP and UPEP pending, this is low yield in the setting of normal renal function and unremarkable hemoglobin  Vitamin D deficiency - Supplements   Acute kidney injury: Nephrotoxic drugs on hold.  Baseline creatinine 0.8, today 1.75.  Will give IV fluids   Essential hypertension: Due to low blood pressure Home medications on hold.  IV as needed.   Mild hypercalcemia: Resolved with IV fluids.  Likely due to dehydration.   Leukocytosis: Likely reactive has remained afebrile, now resolved. Tmax 99.3.   Tobacco abuse: She has been counseled family has been informed that she needs to quit smoking.   Soft Bp, getting IVF. Discussed with Dr Vassie Loll, low threshold to transfer her to the ICU  Given multiple comorbidities, advanced age and dysphagia palliative care team has been consulted.  Headache since a discussion with the family, we will plan on meeting them again tomorrow morning around 10 AM.   DVT prophylaxis: lovenox Family Communication: Family updated Status is: Inpatient Remains inpatient appropriate because: Acute CVA      Subjective: Patient seen and examined at bedside.  Remains mostly unresponsive.  Moves extremities at time but no meaningful interaction. Met with the patient's daughter at bedside [name] and also Misty Stanley over the phone.  Palliative care in the room with me.  We discussed goals of care and explained them patient's medical condition.  They understand at this time patient's condition is critical and will remain as aggressive as possible.  Will meet again with the family members tomorrow to discuss further goals of care.   Examination:  General exam: Appears calm and comfortable, pupils are minimally reactive  bilaterally Respiratory system: Mild anterior rhonchi Cardiovascular system: S1 & S2 heard, RRR. No JVD, murmurs, rubs, gallops or clicks. No pedal edema. Gastrointestinal system: Abdomen is nondistended, soft Central nervous system: Difficult to fully assess Extremities: To assess as no meaningful movement. Skin: No rashes, lesions or ulcers Psychiatry: Difficult to assess Foley catheter in place Cortrak in place

## 2023-08-31 NOTE — Progress Notes (Signed)
Not a good time, per rn she will let us know

## 2023-08-31 NOTE — Progress Notes (Signed)
Patient B/P was low past midnight, MD notified, Rapid response also called for red MEWS and respiratory therapy notified for low SAO2 and increase respiration. Patient on high flow 02 at 10l/min and saturations in the 80's and low 90's emergency care initiated, foley placed per MD order.

## 2023-08-31 NOTE — Progress Notes (Addendum)
PROGRESS NOTE    Nicole Chan  ONG:[id number] DOB: 06-Dec-1940 DOA: 08/25/2023 PCP: Andi Devon, MD    Brief Narrative:  82 y.o. female past medical history significant for essential hypertension, was of the history was obtained from the chart and son at bedside.  He was found on the floor at bedside at 7:30 AM on the day of admission, last time known to be normal was 10 PM the night before.  She smelled like urine was having dysarthria and right arm weakness with poor mentation.  CTA of the head showed no acute findings, extensive chronic small vessel disease. MRI of the brain confirmed acute CVA. Transthoracic Echo showed an EF of 50%, no wall motion abnormality.  Over the last 24 hours she has had mental status decline therefore PCCM consulted.  There is also concerns of dysphagia therefore started on Unasyn to cover for aspiration.   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  A1c 5.6, LDL 141 on statin.  CTA head and neck showed bilateral 50% ICA stenosis.  Vascular planning CEA but patient declined.  Echo shows EF 40-45%. -Neurology recommended DAPT  Acute change in mental status Possible urinary tract infection Elevated ammonia - Evolving prior infarct noted on CT 12/2 which was initially seen on MRI on 11/27 - Procalcitonin elevated, will broaden antibiotics to Zosyn.  Should very well cover for urinary tract infection as well.  Lactulose twice daily for elevated ammonia -Neuro will reevaluate. Will order EEG.    Dysphagia 2/2 stroke, slp advises re-starting NG tube feeds which have  been ordered Empiric Abx  Acute kidney injury/uremia -Baseline creatinine 0.8 has been rising over the last 3 days.  Creatinine today 1.75 also elevated BUN.  Continue IV fluids    Incidental meningioma/incidental 10 mm osseous lesion in the left frontal calvarium: Seen on MRI 9 mm right anterior flax.  No underlying edema.  Will need a 65-month follow-up. SPEP and UPEP pending, this is low yield in the setting of normal renal function and unremarkable hemoglobin  Vitamin D deficiency - Supplements   Acute kidney injury: Nephrotoxic drugs on hold.  Baseline creatinine 0.8, today 1.75.  Will give IV fluids   Essential hypertension: Due to low blood pressure Home medications on hold.  IV as needed.   Mild hypercalcemia: Resolved with IV fluids.  Likely due to dehydration.   Leukocytosis: Likely reactive has remained afebrile, now resolved. Tmax 99.3.   Tobacco abuse: She has been counseled family has been informed that she needs to quit smoking.   Soft Bp, getting IVF. Discussed with Dr Vassie Loll, low threshold to transfer her to the ICU  Given multiple comorbidities, advanced age and dysphagia palliative care team has been consulted.  Headache since a discussion with the family, we will plan on meeting them again tomorrow morning around 10 AM.   DVT prophylaxis: lovenox Family Communication: Family updated Status is: Inpatient Remains inpatient appropriate because: Acute CVA      Subjective: Patient seen and examined at bedside.  Remains mostly unresponsive.  Moves extremities at time but no meaningful interaction. Met with  the patient's daughter at bedside [name] and also Misty Stanley over the phone.  Palliative care in the room with me.  We discussed goals of care and explained them patient's medical condition.  They understand at this time patient's condition is critical and will remain as aggressive as possible.  Will meet again with the family members tomorrow to discuss further  goals of care.   Examination:  General exam: Appears calm and comfortable, pupils are minimally reactive bilaterally Respiratory system: Mild anterior rhonchi Cardiovascular system: S1 & S2 heard, RRR. No JVD, murmurs, rubs, gallops or clicks. No pedal edema. Gastrointestinal system: Abdomen is nondistended, soft Central nervous system: Difficult to fully assess Extremities: To assess as no meaningful movement. Skin: No rashes, lesions or ulcers Psychiatry: Difficult to assess Foley catheter in place Cortrak in place               Diet Orders (From admission, onward)     Start     Ordered   08/30/23 0001  Diet NPO time specified Except for: Sips with Meds, Ice Chips  Diet effective midnight       Question Answer Comment  Except for Sips with Meds   Except for Ice Chips      08/29/23 1006            Objective: Vitals:   08/31/23 1100 08/31/23 1217 08/31/23 1325 08/31/23 1510  BP: (!) 91/52  (!) 80/48 (!) 97/51  Pulse: 83  81 82  Resp: (!) 27 (!) 24 (!) 26 (!) 29  Temp: 98 F (36.7 C)     TempSrc: Axillary     SpO2: 91%  97% 96%  Weight:      Height:        Intake/Output Summary (Last 24 hours) at 08/31/2023 1521 Last data filed at 08/31/2023 0856 Gross per 24 hour  Intake 4351.13 ml  Output 900 ml  Net 3451.13 ml   Filed Weights   08/25/23 0800 08/31/23 0444 08/31/23 1042  Weight: 89.6 kg 95.1 kg 95.1 kg    Scheduled Meds:  aspirin  81 mg Per Tube Daily   atorvastatin  80 mg Per Tube Daily   Chlorhexidine Gluconate Cloth  6 each Topical Daily   cholecalciferol  1,000 Units Per Tube Daily   clopidogrel  75 mg Per Tube Daily   enoxaparin (LOVENOX) injection  40 mg Subcutaneous Q24H   feeding supplement (PROSource TF20)  60 mL Per Tube BID   free water  100 mL Per Tube Q6H   lactulose  20 g Oral BID   multivitamin with minerals  1 tablet Per Tube Daily   mouth rinse  15 mL Mouth Rinse 4 times per day   thiamine  100 mg Per Tube Daily    Continuous Infusions:  dextrose 5 % and 0.45 % NaCl     feeding supplement (OSMOLITE 1.5 CAL) 1,000 mL (08/31/23 1511)   piperacillin-tazobactam (ZOSYN)  IV 3.375 g (08/31/23 1506)    Nutritional status Signs/Symptoms: severe fat depletion, severe muscle depletion Interventions: Tube feeding Body mass index is 32.84 kg/m.  Data Reviewed:   CBC: Recent Labs  Lab 08/25/23 0823 08/25/23 0830 08/25/23 1640 08/31/23 0435  WBC 13.9*  --  12.5* 11.5*  NEUTROABS 11.6*  --   --  9.8*  HGB 14.9 15.6* 14.3 10.9*  HCT 45.3 46.0 42.3 33.7*  MCV 88.6  --  86.5 89.4  PLT 226  --  217 186   Basic Metabolic Panel: Recent  Labs  Lab 08/26/23 0444 08/27/23 0459 08/28/23 0415 08/29/23 0735 08/30/23 0503 08/30/23 1437 08/30/23 1844 08/31/23 0435 08/31/23 0436  NA  --  140 137 138 141  --   --  140  --   K  --  3.3* 3.6 3.8 3.4*  --   --  3.5  --   CL  --  108 107 105 114*  --   --  112*  --   CO2  --  23 23 20* 19*  --   --  18*  --   GLUCOSE  --  114* 158* 150* 142*  --   --  144*  --   BUN  --  18 19 48* 69*  --   --  97*  --   CREATININE  --  0.90 0.80 1.48* 1.43*  --   --  1.75*  --   CALCIUM  --  9.8 10.0 10.8* 10.1  --   --  9.7  --   MG  --   --   --   --   --  1.9 1.9 1.7  --   PHOS 3.1  --   --   --   --  3.5 2.7  --  2.9   GFR: Estimated Creatinine Clearance: 29.3 mL/min (A) (by C-G formula based on SCr of 1.75 mg/dL (H)). Liver Function Tests: Recent Labs  Lab 08/25/23 0823 08/31/23 0435  AST 27 19  ALT 23 17  ALKPHOS 73 31*  BILITOT 1.5* 2.7*  PROT 6.8 4.6*  ALBUMIN 3.6 1.8*   No results for input(s): "LIPASE", "AMYLASE" in the last 168 hours. Recent Labs  Lab 08/31/23 0913  AMMONIA 64*   Coagulation Profile: Recent Labs  Lab 08/25/23 0823  INR 1.1   Cardiac Enzymes: Recent Labs  Lab 08/25/23 1535  CKTOTAL 144   BNP (last 3 results) No results for input(s): "PROBNP" in the last 8760 hours. HbA1C: No results for input(s): "HGBA1C" in the  last 72 hours. CBG: Recent Labs  Lab 08/30/23 1934 08/30/23 2328 08/31/23 0413 08/31/23 0744 08/31/23 1109  GLUCAP 136* 171* 125* 132* 123*   Lipid Profile: No results for input(s): "CHOL", "HDL", "LDLCALC", "TRIG", "CHOLHDL", "LDLDIRECT" in the last 72 hours. Thyroid Function Tests: No results for input(s): "TSH", "T4TOTAL", "FREET4", "T3FREE", "THYROIDAB" in the last 72 hours. Anemia Panel: No results for input(s): "VITAMINB12", "FOLATE", "FERRITIN", "TIBC", "IRON", "RETICCTPCT" in the last 72 hours. Sepsis Labs: Recent Labs  Lab 08/30/23 1437 08/31/23 0435 08/31/23 0913  PROCALCITON  --   --  8.26  LATICACIDVEN 2.3* 2.5*  --     No results found for this or any previous visit (from the past 240 hour(s)).       Radiology Studies: DG CHEST PORT 1 VIEW  Result Date: 08/30/2023 CLINICAL DATA:  Hypoxia and shortness of breath. EXAM: PORTABLE CHEST 1 VIEW COMPARISON:  Chest radiograph dated 08/25/2023. FINDINGS: Feeding tube with tip in the right hemiabdomen, likely in the distal stomach. Shallow inspiration with bibasilar atelectasis. No focal consolidation, pleural effusion, pneumothorax. Stable mild cardiomegaly. Left pectoral AICD device. No acute osseous pathology. IMPRESSION: Shallow inspiration with bibasilar atelectasis. Electronically Signed   By: Elgie Collard M.D.   On: 08/30/2023 17:51   CT HEAD WO CONTRAST ( )  Result Date: 08/30/2023 CLINICAL DATA:  Recent stroke, stroke suspected EXAM: CT HEAD WITHOUT CONTRAST TECHNIQUE: Contiguous axial images were obtained from the base of the skull through the vertex without intravenous contrast.  RADIATION DOSE REDUCTION: This exam was performed according to the departmental dose-optimization program which includes automated exposure control, adjustment of the mA and/or kV according to patient size and/or use of iterative reconstruction technique. COMPARISON:  08/25/2023 CT head and MRI head FINDINGS: Brain: Increasing  hypodensity in the right corona radiata and basal ganglia, consistent with expected evolution of the infarcts noted on the 08/25/2023 MRI. No evidence of hemorrhagic transformation. No new area of hypodensity to suggest additional acute infarct. No evidence of mass, mass effect, or midline shift. No hydrocephalus or extra-axial fluid collection. Periventricular white matter changes, likely the sequela of chronic small vessel ischemic disease. Remote infarcts in the left cerebellum and left basal ganglia. Vascular: No hyperdense vessel. Atherosclerotic calcifications in the intracranial carotid and vertebral arteries. Skull: Negative for fracture or focal lesion. Sinuses/Orbits: No acute finding. Other: The mastoid air cells are well aerated.  Nasogastric tube. IMPRESSION: Increasing hypodensity in the right corona radiata and basal ganglia, consistent with expected evolution of the acute infarcts noted on the 08/25/2023 MRI. No evidence of hemorrhagic transformation. No new area of hypodensity to suggest additional acute infarct. Electronically Signed   By: Wiliam Ke M.D.   On: 08/30/2023 16:00   DG Abd Portable 1V  Result Date: 08/30/2023 CLINICAL DATA:  Feeding tube placement. EXAM: PORTABLE ABDOMEN - 1 VIEW COMPARISON:  Abdominal radiographs 08/25/2023. FINDINGS: 1200 hours. Single portable examination of the abdomen demonstrates the tip of a feeding tube projecting over the right upper quadrant of the abdomen, likely in the distal stomach or proximal duodenum. The visualized bowel gas pattern is normal. Cardiac AICD leads are partially imaged. IMPRESSION: Feeding tube tip projects over the distal stomach or proximal duodenum. Electronically Signed   By: Carey Bullocks M.D.   On: 08/30/2023 14:22           LOS: 6 days   Time spent= 35 mins    Miguel Rota, MD [facility]  If 7PM-7AM, please contact night-coverage  08/31/2023, 3:21 PM

## 2023-08-31 NOTE — Progress Notes (Signed)
EEG complete - results pending 

## 2023-08-31 NOTE — Procedures (Signed)
Patient Name: Nicole Chan  MRN: [id number]  Epilepsy Attending: Charlsie Quest  Referring Physician/Provider: Miguel Rota, MD  Date: 08/31/2023 Duration: 22.11 mins  Patient history: 82 yo F with right BG/CR and 2 small insular cortex infarcts. EEG to evaluate for seizure  Level of alertness: Asleep/ lethargic   AEDs during EEG study: None  Technical aspects: This EEG study was done with scalp electrodes positioned according to the 10-20 International system of electrode placement. Electrical activity was reviewed with band pass filter of 1-70Hz , sensitivity of 7 uV/mm, display speed of 39mm/sec with a 60Hz  notched filter applied as appropriate. EEG data were recorded continuously and digitally stored.  Video monitoring was available and reviewed as appropriate.  Description: EEG showed continuous generalized predominantly 5 to 6 Hz theta slowing admixed with intermittent 2-3Hz  delta slowing. Hyperventilation and photic stimulation were not performed.     ABNORMALITY - Continuous slow, generalized  IMPRESSION: This study is suggestive of moderate diffuse encephalopathy. No seizures or epileptiform discharges were seen throughout the recording.  [name]

## 2023-08-31 NOTE — Progress Notes (Signed)
Cross covering ICU physician  Called to assess pt for hypoxia req NRB and marginal low BP, not on pressors. Pt is DNR/DNI, confirmed with family. Family meeting scheduled for tomorrow with primary.   Primary has requested transfer to ICU should pt decline further, I will discuss with family goals prior to transfer. Certainly not a candidate for NIV at this time 2/2 mental status and inability to removed mask.  Update 2013: spoke with pt's family at bedside and via phone. Decision was made to allow pt to remain where she is on NRB (spoke with RN and Consulting civil engineer as well) as the family is certain she/they would not want intubation and she cannot tolerate NIV. They have requested should her BP decrease to allow PERIPHERAL dose vasopressors, they would be amendable to that for her. I discussed this with ELINK as well as RN's and all parties are aware to let us know should she need to be transferred if her BP falls. At this time her sats are >92% on NRB and she is poorly responsive but without any resp distress or accessory muscle use. Family is at bedside at this time.   Pt's daughter will be in in the morning with her other support system but also would like to be informed should she req transfer 2/2 her BP alone.   All questions answered to the best of my ability.

## 2023-08-31 NOTE — Progress Notes (Incomplete)
Brief Nutrition Support Note  INTERVENTION:    Recommend:  Initiate tube feeding via Cortrak tube: Osmolite 1.5 at 25 ml/h and increase by 10 ml every 8 hours to goal rate of 45 ml/h (1080 ml per day) Prosource TF20 60 ml BID   Provides 1780 kcal, 107 gm protein, 820 ml free water daily   Recommend 200 ml free water every 6 hours Total free water: 1620 ml - reached out to MD    Greig Castilla, RD, LDN Registered Dietitian II RD pager # available in [facility]  After hours/weekend pager # available in [facility]

## 2023-08-31 NOTE — Progress Notes (Signed)
NAME:  Nicole Chan, MRN:  [id number], DOB:  06-23-1941, LOS: 6 ADMISSION DATE:  08/25/2023, CONSULTATION DATE:  10/30/2022 REFERRING MD: Dr. Ashok Chan, CHIEF COMPLAINT: Respiratory failure  History of Present Illness:  This is a 82 year old female history of nonischemic cardiomyopathy, hypertension, stroke.  Patient had presentation to the hospital of stroke CTA of the head with no acute findings, MRI of brain confirmed an acute CVA.  She had transthoracic echo with an EF of 50%.  She had a right stroke in the corona radiata and 2 small insular cortex infarcts with left-sided weakness.  She suffered dysphagia.  She has a core track placed she has been working with PT.  Over the past 24 hours she has had mental status change  Pertinent  Medical History   Past Medical History:  Diagnosis Date   Arthritis    "maybe in my hands/fingers; not terrible" (07/12/2015)   Chronic systolic CHF (congestive heart failure) (HCC)    Dilated cardiomyopathy (HCC)    Episode of dizziness, secondary to cardiomyopathy possibly  10/25/2014   Essential hypertension    Hypertensive heart disease    LBBB (left bundle branch block)    a. noted on 10/24/14 admission for dizziness    NICM (nonischemic cardiomyopathy) (HCC)    a. Cath 10/29/14: normal coronary arteries (tortuous, compatible with hypertension). b. s/p Medtronic BiV-ICD 06/2015.   Tobacco abuse      Significant Hospital Events: Including procedures, antibiotic start and stop dates in addition to other pertinent events     Interim History / Subjective:   Afebrile. Remains on 10 to 2 L nasal cannula Does not offer any complaints Received fluids overnight for hypotension, blood pressure improved   Objective   Blood pressure (!) 97/49, pulse 82, temperature 98 F (36.7 C), temperature source Axillary, resp. rate (!) 24, weight 95.1 kg, SpO2 92%.        Intake/Output Summary (Last 24 hours) at 08/31/2023 1000 Last data filed at 08/31/2023  0856 Gross per 24 hour  Intake 4351.13 ml  Output 1100 ml  Net 3251.13 ml   Filed Weights   08/25/23 0800 08/31/23 0444  Weight: 89.6 kg 95.1 kg    Examination: General: Elderly female a, no distress, on nasal cannula HENT: Core track in place, pupils reactive Lungs: No accessory muscle use, no rhonchi Cardiovascular: Regular rate rhythm S1-S2 Abdomen: Soft, nontender Extremities: No significant edema Neuro: Left-sided weakness withdraws to pain on the right, nods to name, intermittently follows commands GU: Dark cola colored urine  Labs show mild leukocytosis, lactate 2.5, BUN/creatinine increased to 97/1.75, hemoglobin dropped from 14-10.9  Resolved Hospital Problem list     Assessment & Plan:   Acute hypoxemic respiratory failure concern for aspiration    Plan: Continue Unasyn Remains on 10 to 12 L high flow nasal cannula so remains at risk for mechanical ventilation, work of breathing acceptable for now Continue progressive care monitoring  Acute right-sided CVA, 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 Dysphagia  Repeat head CT 12/2  did not show new infarcts On DAPT but seems to have developed hematuria  Subclavian artery stenosis  -Seen by vascular surgery, defer for now   AKI -Received fluids overnight for hypotension -DC HCTZ, follow renal function      Labs   CBC: Recent Labs  Lab 08/25/23 0823 08/25/23 0830 08/25/23 1640 08/31/23 0435  WBC 13.9*  --  12.5* 11.5*  NEUTROABS 11.6*  --   --  9.8*  HGB  14.9 15.6* 14.3 10.9*  HCT 45.3 46.0 42.3 33.7*  MCV 88.6  --  86.5 89.4  PLT 226  --  217 186    Basic Metabolic Panel: Recent Labs  Lab 08/26/23 0444 08/27/23 0459 08/28/23 0415 08/29/23 0735 08/30/23 0503 08/30/23 1437 08/30/23 1844 08/31/23 0435 08/31/23 0436  NA  --  140 137 138 141  --   --  140  --   K  --  3.3* 3.6 3.8 3.4*  --   --  3.5  --   CL  --  108 107 105 114*  --   --  112*  --   CO2  --  23  23 20* 19*  --   --  18*  --   GLUCOSE  --  114* 158* 150* 142*  --   --  144*  --   BUN  --  18 19 48* 69*  --   --  97*  --   CREATININE  --  0.90 0.80 1.48* 1.43*  --   --  1.75*  --   CALCIUM  --  9.8 10.0 10.8* 10.1  --   --  9.7  --   MG  --   --   --   --   --  1.9 1.9 1.7  --   PHOS 3.1  --   --   --   --  3.5 2.7  --  2.9   GFR: Estimated Creatinine Clearance: 29.3 mL/min (A) (by C-G formula based on SCr of 1.75 mg/dL (H)). Recent Labs  Lab 08/25/23 0823 08/25/23 1640 08/30/23 1437 08/31/23 0435  WBC 13.9* 12.5*  --  11.5*  LATICACIDVEN  --   --  2.3* 2.5*    Liver Function Tests: Recent Labs  Lab 08/25/23 0823 08/31/23 0435  AST 27 19  ALT 23 17  ALKPHOS 73 31*  BILITOT 1.5* 2.7*  PROT 6.8 4.6*  ALBUMIN 3.6 1.8*   No results for input(s): "LIPASE", "AMYLASE" in the last 168 hours. No results for input(s): "AMMONIA" in the last 168 hours.  ABG    Component Value Date/Time   PHART 7.42 08/30/2023 1434   PCO2ART 30 (L) 08/30/2023 1434   PO2ART 60 (L) 08/30/2023 1434   HCO3 19.5 (L) 08/30/2023 1434   TCO2 22 08/25/2023 0830   ACIDBASEDEF 3.9 (H) 08/30/2023 1434   O2SAT 89.5 08/30/2023 1434     Coagulation Profile: Recent Labs  Lab 08/25/23 0823  INR 1.1    Cardiac Enzymes: Recent Labs  Lab 08/25/23 1535  CKTOTAL 144    HbA1C: Hgb A1c MFr Bld  Date/Time Value Ref Range Status  08/25/2023 04:40 PM 5.4 4.8 - 5.6 % Final    Comment:    (NOTE) Pre diabetes:          5.7%-6.4%  Diabetes:              >6.4%  Glycemic control for   <7.0% adults with diabetes     CBG: Recent Labs  Lab 08/27/23 1608 08/30/23 1447 08/30/23 1934 08/30/23 2328 08/31/23 0413  GLUCAP 144* 156* 136* 171* 125*   Cyril Mourning MD. FCCP. [facility] Pager : [phone]  If no response to pager , please call [phone] until 7 pm After 7:00 pm call Elink  [phone]     08/31/2023 10:00 AM

## 2023-08-31 NOTE — Care Management Important Message (Signed)
Important Message  Patient Details  Name: Nicole Chan MRN: [id number] Date of Birth: 1941-05-12   Important Message Given:  Yes - Medicare IM     Dorena Bodo 08/31/2023, 4:09 PM

## 2023-08-31 NOTE — Progress Notes (Signed)
SLP Cancellation Note  Patient Details Name: Nicole Chan MRN: [id number] DOB: 13-May-1941   Cancelled treatment:       Reason Eval/Treat Not Completed: Medical issues which prohibited therapy-SLP will follow for GOC. [name], MA CCC/SLP Clinical Specialist - Acute Care SLP Acute Rehabilitation Services Office number [phone]   [name] [name] 08/31/2023, 1:36 PM

## 2023-08-31 NOTE — Progress Notes (Signed)
Received message that pt is now avail. For her EEG.

## 2023-08-31 NOTE — Consult Note (Signed)
Consultation Note Date: 08/31/2023   Patient Name: Nicole Chan  DOB: 1941/07/12  MRN: [id number]  Age / Sex: 82 y.o., female  PCP: Nicole Devon, MD Referring Physician: Miguel Rota, MD  Reason for Consultation: Establishing goals of care  HPI/Patient Profile:   82 y.o. female   admitted on 08/25/2023 with   history of nonischemic cardiomyopathy, hypertension, stroke.  Patient had presentation to the hospital of stroke CTA of the head with no acute findings, MRI of brain confirmed an acute CVA.  She had transthoracic echo with an EF of 50%.  She had a right stroke in the corona radiata and 2 small insular cortex infarcts with left-sided weakness.  She suffered dysphagia.  She has a core track placed she has been working with PT.   Progressive mental status changes  Patient currently without medical decision making capacity   Family face treatment option decisions, advanced directive decisions and anticipatory care needs.   Clinical Assessment and Goals of Care:  This NP Nicole Chan reviewed medical records, received report from team, assessed the patient and then meet at the patient's bedside along with her family to include daughter Nicole Chan, by telephone daughter Nicole Chan and son/ Nicole Chan to discuss diagnosis, prognosis, GOC, and options.   Concept of Palliative Care was introduced as specialized medical care for people and their families living with serious illness.  If focuses on providing relief from the symptoms and stress of a serious illness.  The goal is to improve quality of life for both the patient and the family.  Values and goals of care important to patient and family were attempted to be elicited.  Created space and opportunity for family to explore thoughts and feelings regarding current medical situation.  Understandably all are inquiring for all relevant  information regarding treatment options, plan of care and regular conversation and updates from providers;  family are open to all offered and available medical interventions to prolong life, they ultimately are hoping for improvement for [name].    Education offered on the importance of continued conversation within the family and with the medical providers regarding overall plan of care and treatment options, ensuring decisions are within the context of the patient's values and goals of care.   Explored with family any advance care planning conversations or documentation.  They report that there is no documented H POA or advance care planning documents.   A  discussion was had today regarding advanced directives.  Concepts specific to code status, artifical feeding and hydration  was had.    They all agree that the patient has verbalized in the past that she would not want intubation.  Education offered on concept specific to dysphagia and risk for aspiration 2/2 secondary to worsening neurologic status   Education offered on risks and benefits of artificial feeding/PEG tube   MOST form introduced     In an attempt to coordinate care and streamline communication I offered to coordinate a meeting with providers tomorrow.  All family  agree that 10 AM will be convenient for them as a unit.  Conference room secured on 3 W.  Discussed with Dr. Nelson Chan, (he will be present) and requested neurology and CCM input.   PMT will continue to support holistically.          No documented H POA or advance care planning documents.  Patient children are patient's medical decision makers at this time        SUMMARY OF RECOMMENDATIONS    Code Status/Advance Care Planning: DNR/DNI-limited        Extensive education offered and all family in agreement with DNR-limited status understanding that patient herself has verbalized this clearly to her family   Palliative Prophylaxis:  Aspiration,  Bowel Regimen, Delirium Protocol, Frequent Pain Assessment, and Oral Care  Additional Recommendations (Limitations, Scope, Preferences): Full Scope Treatment  Psycho-social/Spiritual:  Desire for further Chaplaincy support: Offered visit by spiritual care, strongly declined Additional Recommendations: therapeutic listening   Prognosis:  Unable to determine  Discharge Planning: To Be Determined      Primary Diagnoses: Present on Admission:  Stroke (cerebrum) (HCC)  Tobacco use  Essential hypertension   I have reviewed the medical record, interviewed the patient and family, and examined the patient. The following aspects are pertinent.  Past Medical History:  Diagnosis Date   Arthritis    "maybe in my hands/fingers; not terrible" (07/12/2015)   Chronic systolic CHF (congestive heart failure) (HCC)    Dilated cardiomyopathy (HCC)    Episode of dizziness, secondary to cardiomyopathy possibly  10/25/2014   Essential hypertension    Hypertensive heart disease    LBBB (left bundle branch block)    a. noted on 10/24/14 admission for dizziness    NICM (nonischemic cardiomyopathy) (HCC)    a. Cath 10/29/14: normal coronary arteries (tortuous, compatible with hypertension). b. s/p Medtronic BiV-ICD 06/2015.   Tobacco abuse    Social History   Socioeconomic History   Marital status: Widowed    Spouse name: Not on file   Number of children: 4   Years of education: Not on file   Highest education level: Not on file  Occupational History   Not on file  Tobacco Use   Smoking status: Every Day    Current packs/day: 0.50    Average packs/day: 0.5 packs/day for 55.0 years (27.5 ttl pk-yrs)    Types: Cigarettes   Smokeless tobacco: Never  Substance and Sexual Activity   Alcohol use: No    Alcohol/week: 0.0 standard drinks of alcohol    Comment: on special occasions   Drug use: No   Sexual activity: Yes  Other Topics Concern   Not on file  Social History Narrative   Not on  file   Social Determinants of Health   Financial Resource Strain: Not on file  Food Insecurity: No Food Insecurity (08/25/2023)   Hunger Vital Sign    Worried About Running Out of Food in the Last Year: Never true    Ran Out of Food in the Last Year: Never true  Transportation Needs: No Transportation Needs (08/25/2023)   PRAPARE - Administrator, Civil Service (Medical): No    Lack of Transportation (Non-Medical): No  Physical Activity: Not on file  Stress: Not on file  Social Connections: Unknown (02/10/2022)   [facility]   Social Network    Social Network: Not on file   Family History  Problem Relation Age of Onset   Arrhythmia Mother  Hypertension Mother    Diabetes Mother    Heart disease Mother    Cancer Father    Lung cancer Sister    Healthy Paternal Aunt    Heart attack Neg Hx    Stroke Neg Hx    Scheduled Meds:  aspirin  81 mg Per Tube Daily   atorvastatin  80 mg Per Tube Daily   Chlorhexidine Gluconate Cloth  6 each Topical Daily   cholecalciferol  1,000 Units Per Tube Daily   clopidogrel  75 mg Per Tube Daily   enoxaparin (LOVENOX) injection  40 mg Subcutaneous Q24H   feeding supplement (PROSource TF20)  60 mL Per Tube BID   multivitamin with minerals  1 tablet Per Tube Daily   mouth rinse  15 mL Mouth Rinse 4 times per day   thiamine  100 mg Per Tube Daily   Continuous Infusions:  ampicillin-sulbactam (UNASYN) IV     dextrose 5 % and 0.45 % NaCl     PRN Meds:.acetaminophen **OR** acetaminophen (TYLENOL) oral liquid 160 mg/5 mL **OR** acetaminophen, guaiFENesin, hydrALAZINE, ipratropium-albuterol, metoprolol tartrate, ondansetron (ZOFRAN) IV, mouth rinse, senna-docusate, traZODone Medications Prior to Admission:  Prior to Admission medications   Medication Sig Start Date End Date Taking? Authorizing Provider  atorvastatin (LIPITOR) 20 MG tablet Take 20 mg by mouth daily.   Yes [provider]   carvedilol (COREG) 12.5 MG tablet Take 1 tablet (12.5 mg total) by mouth 2 (two) times daily with a meal. 07/19/23  Yes Marinus Maw, MD  irbesartan (AVAPRO) 300 MG tablet Take 1 tablet (300 mg total) by mouth daily. 04/03/21  Yes Jacalyn Lefevre, MD  Vitamin D, Ergocalciferol, (DRISDOL) 50000 UNITS CAPS capsule Take 50,000 Units by mouth 2 (two) times a week.    Yes [provider]   Allergies  Allergen Reactions   Shellfish Allergy Itching   Review of Systems  Unable to perform ROS: Patient unresponsive    Physical Exam Constitutional:      Appearance: She is overweight. She is ill-appearing.     Interventions: Nasal cannula in place.  Cardiovascular:     Rate and Rhythm: Normal rate.  Pulmonary:     Effort: Pulmonary effort is normal.  Skin:    General: Skin is warm and dry.     Vital Signs: BP (!) 97/49 (BP Location: Left Arm)   Pulse 82   Temp 98 F (36.7 C) (Axillary)   Resp (!) 24   Ht 5\' 7"  (1.702 m)   Wt 95.1 kg   SpO2 92%   BMI 32.84 kg/m  Pain Scale: FLACC   Pain Score: 0-No pain   SpO2: SpO2: 92 % O2 Device:SpO2: 92 % O2 Flow Rate: .O2 Flow Rate (L/min): 12 L/min  IO: Intake/output summary:  Intake/Output Summary (Last 24 hours) at 08/31/2023 1056 Last data filed at 08/31/2023 0856 Gross per 24 hour  Intake 4351.13 ml  Output 1100 ml  Net 3251.13 ml    LBM: Last BM Date : 08/30/23 Baseline Weight: Weight: 89.6 kg Most recent weight: Weight: 95.1 kg     Palliative Assessment/Data:  20% currently     Time: 90 minutes   Signed by: Nicole Creed, NP   Please contact Palliative Medicine Team phone at [phone] for questions and concerns.  For individual provider: See Loretha Stapler

## 2023-08-31 NOTE — Progress Notes (Signed)
STROKE TEAM PROGRESS NOTE   SUBJECTIVE (INTERVAL HISTORY) Stroke team was asked to reconsult on this patient due to worsening neurological status in the last few days.  Patient's daughter was present at the bedside.  There has been gradual decline in her mental status with sleepiness and poor responsiveness.  There has been no witnessed seizure activity.  CT scan of the head yesterday shows increased hypodensity in the right corona radiator and basal ganglia consistent with expected evolution of her acute stroke seen on previous MRI from 08/25/2023.  No hemorrhagic transformation or new infarct is noted. She is afebrile.  Lab work fairly stable except slightly increased creatinine of 1.75.  Chest x-ray shows bibasilar atelectasis but no clear infiltrates.  OBJECTIVE Temp:  [97.5 F (36.4 C)-98.8 F (37.1 C)] 97.5 F (36.4 C) (12/03 1510) Pulse Rate:  [71-92] 82 (12/03 1510) Cardiac Rhythm: Ventricular paced (12/03 0700) Resp:  [18-34] 26 (12/03 1510) BP: (58-135)/(27-61) 97/51 (12/03 1510) SpO2:  [88 %-97 %] 96 % (12/03 1510) Weight:  [95.1 kg] 95.1 kg (12/03 1042)  Recent Labs  Lab 08/30/23 1934 08/30/23 2328 08/31/23 0413 08/31/23 0744 08/31/23 1109  GLUCAP 136* 171* 125* 132* 123*   Recent Labs  Lab 08/26/23 0444 08/27/23 0459 08/28/23 0415 08/29/23 0735 08/30/23 0503 08/30/23 1437 08/30/23 1844 08/31/23 0435 08/31/23 0436  NA  --  140 137 138 141  --   --  140  --   K  --  3.3* 3.6 3.8 3.4*  --   --  3.5  --   CL  --  108 107 105 114*  --   --  112*  --   CO2  --  23 23 20* 19*  --   --  18*  --   GLUCOSE  --  114* 158* 150* 142*  --   --  144*  --   BUN  --  18 19 48* 69*  --   --  97*  --   CREATININE  --  0.90 0.80 1.48* 1.43*  --   --  1.75*  --   CALCIUM  --  9.8 10.0 10.8* 10.1  --   --  9.7  --   MG  --   --   --   --   --  1.9 1.9 1.7  --   PHOS 3.1  --   --   --   --  3.5 2.7  --  2.9   Recent Labs  Lab 08/25/23 0823 08/31/23 0435  AST 27 19  ALT 23  17  ALKPHOS 73 31*  BILITOT 1.5* 2.7*  PROT 6.8 4.6*  ALBUMIN 3.6 1.8*   Recent Labs  Lab 08/25/23 0823 08/25/23 0830 08/25/23 1640 08/31/23 0435  WBC 13.9*  --  12.5* 11.5*  NEUTROABS 11.6*  --   --  9.8*  HGB 14.9 15.6* 14.3 10.9*  HCT 45.3 46.0 42.3 33.7*  MCV 88.6  --  86.5 89.4  PLT 226  --  217 186   Recent Labs  Lab 08/25/23 1535  CKTOTAL 144   No results for input(s): "LABPROT", "INR" in the last 72 hours.  Recent Labs    08/31/23 0930  COLORURINE BROWN*  LABSPEC TEST NOT REPORTED DUE TO COLOR INTERFERENCE OF URINE PIGMENT  PHURINE TEST NOT REPORTED DUE TO COLOR INTERFERENCE OF URINE PIGMENT  GLUCOSEU TEST NOT REPORTED DUE TO COLOR INTERFERENCE OF URINE PIGMENT*  HGBUR TEST NOT REPORTED DUE TO COLOR INTERFERENCE OF  URINE PIGMENT*  BILIRUBINUR TEST NOT REPORTED DUE TO COLOR INTERFERENCE OF URINE PIGMENT*  KETONESUR TEST NOT REPORTED DUE TO COLOR INTERFERENCE OF URINE PIGMENT*  PROTEINUR TEST NOT REPORTED DUE TO COLOR INTERFERENCE OF URINE PIGMENT*  NITRITE TEST NOT REPORTED DUE TO COLOR INTERFERENCE OF URINE PIGMENT*  LEUKOCYTESUR TEST NOT REPORTED DUE TO COLOR INTERFERENCE OF URINE PIGMENT*       Component Value Date/Time   CHOL 203 (H) 08/26/2023 0444   TRIG 136 08/26/2023 0444   HDL 35 (L) 08/26/2023 0444   CHOLHDL 5.8 08/26/2023 0444   VLDL 27 08/26/2023 0444   LDLCALC 141 (H) 08/26/2023 0444   Lab Results  Component Value Date   HGBA1C 5.4 08/25/2023      Component Value Date/Time   LABOPIA NONE DETECTED 08/25/2023 1035   COCAINSCRNUR NONE DETECTED 08/25/2023 1035   LABBENZ NONE DETECTED 08/25/2023 1035   AMPHETMU NONE DETECTED 08/25/2023 1035   THCU NONE DETECTED 08/25/2023 1035   LABBARB NONE DETECTED 08/25/2023 1035    Recent Labs  Lab 08/25/23 0823  ETH <10    I have personally reviewed the radiological images below and agree with the radiology interpretations.  DG CHEST PORT 1 VIEW  Result Date: 08/30/2023 CLINICAL DATA:   Hypoxia and shortness of breath. EXAM: PORTABLE CHEST 1 VIEW COMPARISON:  Chest radiograph dated 08/25/2023. FINDINGS: Feeding tube with tip in the right hemiabdomen, likely in the distal stomach. Shallow inspiration with bibasilar atelectasis. No focal consolidation, pleural effusion, pneumothorax. Stable mild cardiomegaly. Left pectoral AICD device. No acute osseous pathology. IMPRESSION: Shallow inspiration with bibasilar atelectasis. Electronically Signed   By: Elgie Collard M.D.   On: 08/30/2023 17:51   CT HEAD WO CONTRAST ( )  Result Date: 08/30/2023 CLINICAL DATA:  Recent stroke, stroke suspected EXAM: CT HEAD WITHOUT CONTRAST TECHNIQUE: Contiguous axial images were obtained from the base of the skull through the vertex without intravenous contrast. RADIATION DOSE REDUCTION: This exam was performed according to the departmental dose-optimization program which includes automated exposure control, adjustment of the mA and/or kV according to patient size and/or use of iterative reconstruction technique. COMPARISON:  08/25/2023 CT head and MRI head FINDINGS: Brain: Increasing hypodensity in the right corona radiata and basal ganglia, consistent with expected evolution of the infarcts noted on the 08/25/2023 MRI. No evidence of hemorrhagic transformation. No new area of hypodensity to suggest additional acute infarct. No evidence of mass, mass effect, or midline shift. No hydrocephalus or extra-axial fluid collection. Periventricular white matter changes, likely the sequela of chronic small vessel ischemic disease. Remote infarcts in the left cerebellum and left basal ganglia. Vascular: No hyperdense vessel. Atherosclerotic calcifications in the intracranial carotid and vertebral arteries. Skull: Negative for fracture or focal lesion. Sinuses/Orbits: No acute finding. Other: The mastoid air cells are well aerated.  Nasogastric tube. IMPRESSION: Increasing hypodensity in the right corona radiata and basal  ganglia, consistent with expected evolution of the acute infarcts noted on the 08/25/2023 MRI. No evidence of hemorrhagic transformation. No new area of hypodensity to suggest additional acute infarct. Electronically Signed   By: Wiliam Ke M.D.   On: 08/30/2023 16:00   DG Abd Portable 1V  Result Date: 08/30/2023 CLINICAL DATA:  Feeding tube placement. EXAM: PORTABLE ABDOMEN - 1 VIEW COMPARISON:  Abdominal radiographs 08/25/2023. FINDINGS: 1200 hours. Single portable examination of the abdomen demonstrates the tip of a feeding tube projecting over the right upper quadrant of the abdomen, likely in the distal stomach or proximal duodenum. The visualized bowel  gas pattern is normal. Cardiac AICD leads are partially imaged. IMPRESSION: Feeding tube tip projects over the distal stomach or proximal duodenum. Electronically Signed   By: Carey Bullocks M.D.   On: 08/30/2023 14:22   VAS US CAROTID  Result Date: 08/28/2023 Carotid Arterial Duplex Study Patient Name:  LEANNDRA DALO  Date of Exam:   08/26/2023 Medical Rec #: [id number]         Accession #:    [id number] Date of Birth: [birth year]        Patient Gender: F Patient Age:   23 years Exam Location:  [facility] Procedure:      VAS US CAROTID Referring Phys: [facility] --------------------------------------------------------------------------------  Indications:   CVA. Risk Factors:  Hypertension, hyperlipidemia, current smoker. Other Factors: CHF, ICD, PAD. Performing Technologist: Ernestene Mention RVT, RDMS  Examination Guidelines: A complete evaluation includes B-mode imaging, spectral Doppler, color Doppler, and power Doppler as needed of all accessible portions of each vessel. Bilateral testing is considered an integral part of a complete examination. Limited examinations for reoccurring indications may be performed as noted.  Right Carotid Findings: +----------+--------+--------+--------+---------------------+------------------+            PSV cm/sEDV cm/sStenosisPlaque Description   Comments           +----------+--------+--------+--------+---------------------+------------------+ CCA Prox  71      11              heterogenous                            +----------+--------+--------+--------+---------------------+------------------+ CCA Distal63      12              heterogenous                            +----------+--------+--------+--------+---------------------+------------------+ ICA Prox  142     24      40-59%  diffuse, irregular   diffuse calcific                                     and calcific         plaque and                                                                shadowing          +----------+--------+--------+--------+---------------------+------------------+ ICA Mid   70      14                                                      +----------+--------+--------+--------+---------------------+------------------+ ICA Distal75      18                                                      +----------+--------+--------+--------+---------------------+------------------+ ECA  241     21      >50%    heterogenous and                                                          calcific                                +----------+--------+--------+--------+---------------------+------------------+ +----------+--------+-------+--------+-------------------+           PSV cm/sEDV cmsDescribeArm Pressure (mmHG) +----------+--------+-------+--------+-------------------+ [id number]            Stenotic                    +----------+--------+-------+--------+-------------------+ +---------+--------+--+--------+-+ VertebralPSV cm/s35EDV cm/s7 +---------+--------+--+--------+-+  Left Carotid Findings: +----------+--------+--------+--------+---------------------+------------------+           PSV cm/sEDV cm/sStenosisPlaque Description   Comments            +----------+--------+--------+--------+---------------------+------------------+ CCA Prox  86      16              heterogenous         intimal thickening +----------+--------+--------+--------+---------------------+------------------+ CCA Distal63      12              heterogenous                            +----------+--------+--------+--------+---------------------+------------------+ ICA Prox  136     26              diffuse, irregular,                                                       calcific and                                                              heterogenous                            +----------+--------+--------+--------+---------------------+------------------+ ICA Mid   122     26                                                      +----------+--------+--------+--------+---------------------+------------------+ ICA Distal94      14                                                      +----------+--------+--------+--------+---------------------+------------------+ ECA       109     0  heterogenous                            +----------+--------+--------+--------+---------------------+------------------+ +----------+--------+--------+--------+-------------------+           PSV cm/sEDV cm/sDescribeArm Pressure (mmHG) +----------+--------+--------+--------+-------------------+ [id number]             Stenotic                    +----------+--------+--------+--------+-------------------+ +---------+--------+--+--------+-+ VertebralPSV cm/s55EDV cm/s8 +---------+--------+--+--------+-+   Summary: Right Carotid: Velocities in the right ICA are consistent with a 40-59%                stenosis. The ECA appears >50% stenosed. Left Carotid: Velocities in the left ICA are consistent with a 40-59% stenosis. Vertebrals:  Bilateral vertebral arteries demonstrate antegrade flow. Subclavians: Bilateral subclavian  arteries were stenotic. *See table(s) above for measurements and observations.  Electronically signed by Delia Heady MD on 08/28/2023 at 12:52:56 PM.    Final    DG Swallowing Func-Speech Pathology  Result Date: 08/27/2023 Table formatting from the original result was not included. Modified Barium Swallow Study Patient Details Name: RANIM HERTLING MRN: [id number] Date of Birth: Mar 27, 1941 Today's Date: 08/27/2023 HPI/PMH: HPI: Julyana Bedrick is an 82 yr old female who presented to [facility] via EMS on 08/25/2023. She was found down by family. EMS activated code stroke alert for left sided weakness and questionable rt gaze preference. 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 Small acute infarcts within the right insula and right subinsular  white matter.   PMH of CHF, Cardiomyopathy, HTN, pacemaker. Clinical Impression: Clinical Impression: Pt presents with a moderate sensorimotor oropharyngeal dysphagia. There was impaired oral control with residue on tongue and left lateral sulcus. Pharyngeal swallow onset occurred at the level of the pyriforms, with spillage into the larynx and trace, silent aspiration of thin liquids.  Nectar thick liquids reached the level of the vocal folds without eliciting a cough response. Laryngeal vestibule closure was adequate but often occurred too late to be fully protective. There was reduced pharyngeal squeeze and base of tongue contact, leading to mild residue primarily in the valleculae, with notable retrograde flow of some residue from lower pharynx into mid-pharynx.  There was partial obstruction of flow through PES and occasional trace backflow observed to squeeze upward through PES.  Technical difficulty prevented adequate screen of the esophagus. A head turn to the left was effective in preventing penetration/aspiration with nectar-thick liquids.  Pt has good potential for recovery of swallow - recommend continuing dysphagia 1 for now;  allow nectar thick liquids but pt must take single sips and turn head over left should for each swallow. Meds should be crushed.  Her daughter [name] was present for study and we reviewed results and recs; pt/dtr agree with plan. SLP will follow. Factors that may increase risk of adverse event in presence of aspiration [name] & [name] 2021): No data recorded Recommendations/Plan: Swallowing Evaluation Recommendations Swallowing Evaluation Recommendations Recommendations: PO diet PO Diet Recommendation: Dysphagia 1 (Pureed); Mildly thick liquids (Level 2, nectar thick) Liquid Administration via: Spoon; Cup; Straw Medication Administration: Crushed with puree Supervision: Full assist for feeding Swallowing strategies  : Head turn left during swallowing; Slow rate; Small bites/sips Postural changes: Position pt fully upright for meals Oral care recommendations: Oral care BID (2x/day) Treatment Plan Treatment Plan Treatment recommendations: Therapy as outlined in treatment plan below Follow-up recommendations: Acute inpatient rehab (3  hours/day) Functional status assessment: Patient has had a recent decline in their functional status and demonstrates the ability to make significant improvements in function in a reasonable and predictable amount of time. Treatment frequency: Min 2x/week Treatment duration: 2 weeks Interventions: Aspiration precaution training; Patient/family education; Trials of upgraded texture/liquids; Compensatory techniques; Oropharyngeal exercises Recommendations Recommendations for follow up therapy are one component of a multi-disciplinary discharge planning process, led by the attending physician.  Recommendations may be updated based on patient status, additional functional criteria and insurance authorization. Assessment: Orofacial Exam: Orofacial Exam Oral Cavity: Oral Hygiene: WFL Oral Cavity - Dentition: Edentulous Orofacial Anatomy: WFL Oral Motor/Sensory Function: Suspected cranial  nerve impairment CN V - Trigeminal: Left sensory impairment CN VII - Facial: Left motor impairment Anatomy: Anatomy: Suspected cervical osteophytes Boluses Administered: Boluses Administered Boluses Administered: Thin liquids (Level 0); Mildly thick liquids (Level 2, nectar thick); Moderately thick liquids (Level 3, honey thick); Puree  Oral Impairment Domain: Oral Impairment Domain Lip Closure: Interlabial escape, no progression to anterior lip Tongue control during bolus hold: Cohesive bolus between tongue to palatal seal Bolus preparation/mastication: -- (n/a) Bolus transport/lingual motion: Delayed initiation of tongue motion (oral holding) Oral residue: Residue collection on oral structures Location of oral residue : Tongue; Floor of mouth Initiation of pharyngeal swallow : Pyriform sinuses  Pharyngeal Impairment Domain: Pharyngeal Impairment Domain Soft palate elevation: No bolus between soft palate (SP)/pharyngeal wall (PW) Laryngeal elevation: Complete superior movement of thyroid cartilage with complete approximation of arytenoids to epiglottic petiole Anterior hyoid excursion: Complete anterior movement Epiglottic movement: Complete inversion Laryngeal vestibule closure: Incomplete, narrow column air/contrast in laryngeal vestibule Pharyngeal stripping wave : Present - diminished Pharyngeal contraction (A/P view only): N/A Pharyngoesophageal segment opening: Partial distention/partial duration, partial obstruction of flow Tongue base retraction: Narrow column of contrast or air between tongue base and PPW Pharyngeal residue: Collection of residue within or on pharyngeal structures Location of pharyngeal residue: Tongue base; Valleculae; Pharyngeal wall; Pyriform sinuses  Esophageal Impairment Domain: Esophageal Impairment Domain Esophageal clearance upright position: -- (tech difficulties viewing esophagus) Pill: Pill Consistency administered: -- (NT) Penetration/Aspiration Scale Score:  Penetration/Aspiration Scale Score 1.  Material does not enter airway: Puree; Moderately thick liquids (Level 3, honey thick) 5.  Material enters airway, CONTACTS cords and not ejected out: Mildly thick liquids (Level 2, nectar thick) 8.  Material enters airway, passes BELOW cords without attempt by patient to eject out (silent aspiration) : Thin liquids (Level 0) Compensatory Strategies: No data recorded  General Information: Caregiver present: Yes  Diet Prior to this Study: Dysphagia 1 (pureed); Thin liquids (Level 0)   Temperature : Normal   Respiratory Status: WFL   Supplemental O2: None (Room air)   History of Recent Intubation: No  Behavior/Cognition: Alert Self-Feeding Abilities: Needs assist with self-feeding Baseline vocal quality/speech: Normal Volitional Cough: Able to elicit Volitional Swallow: Able to elicit Exam Limitations: Limited visibility Goal Planning: Prognosis for improved oropharyngeal function: Good No data recorded No data recorded No data recorded Consulted and agree with results and recommendations: Patient; Family member/caregiver Pain: Pain Assessment Pain Assessment: No/denies pain Faces Pain Scale: 4 Pain Location: generalized Pain Descriptors / Indicators: Guarding; Grimacing Pain Intervention(s): Monitored during session End of Session: Start Time:SLP Start Time (ACUTE ONLY): 1430 Stop Time: SLP Stop Time (ACUTE ONLY): 1500 Time Calculation:SLP Time Calculation (min) (ACUTE ONLY): 30 min Charges: SLP Evaluations $ SLP Speech Visit: 1 Visit SLP Evaluations $MBS Swallow: 1 Procedure $ SLP EVAL LANGUAGE/SOUND PRODUCTION: 1 Procedure $Swallowing Treatment: 1 Procedure  SLP visit diagnosis: SLP Visit Diagnosis: Dysphagia, oropharyngeal phase (R13.12) Past Medical History: Past Medical History: Diagnosis Date  Arthritis   "maybe in my hands/fingers; not terrible" (07/12/2015)  Chronic systolic CHF (congestive heart failure) (HCC)   Dilated cardiomyopathy (HCC)   Episode of dizziness,  secondary to cardiomyopathy possibly  10/25/2014  Essential hypertension   Hypertensive heart disease   LBBB (left bundle branch block)   a. noted on 10/24/14 admission for dizziness   NICM (nonischemic cardiomyopathy) (HCC)   a. Cath 10/29/14: normal coronary arteries (tortuous, compatible with hypertension). b. s/p [employer] BiV-ICD 06/2015.  Tobacco abuse  Past Surgical History: Past Surgical History: Procedure Laterality Date  ABDOMINAL HYSTERECTOMY  1970's  APPENDECTOMY    BI-VENTRICULAR IMPLANTABLE CARDIOVERTER DEFIBRILLATOR  (CRT-D)  07/11/2015  BIV ICD GENERATOR CHANGEOUT N/A 03/15/2020  Procedure: BIV ICD GENERATOR CHANGEOUT;  Surgeon: Marinus Maw, MD;  Location: [facility] INVASIVE CV LAB;  Service: Cardiovascular;  Laterality: N/A;  CARDIAC CATHETERIZATION    EP IMPLANTABLE DEVICE N/A 07/11/2015  Procedure: BiV ICD Insertion CRT-D;  Surgeon: Marinus Maw, MD;  Location: [facility] INVASIVE CV LAB;  Service: Cardiovascular;  Laterality: N/A;  LEFT HEART CATHETERIZATION WITH CORONARY ANGIOGRAM N/A 10/29/2014  Procedure: LEFT HEART CATHETERIZATION WITH CORONARY ANGIOGRAM;  Surgeon: Lesleigh Noe, MD;  Location: [facility] CATH LAB;  Service: Cardiovascular;  Laterality: N/A;  TONSILLECTOMY    TUBAL LIGATION  1970's Carolan Shiver 08/27/2023, 3:59 PM  VAS [place] UPPER EXTREMITY ARTERIAL DUPLEX  Result Date: 08/26/2023  UPPER EXTREMITY DUPLEX STUDY Patient Name:  ANIEYAH ARIZPE  Date of Exam:   08/26/2023 Medical Rec #: [id number]         Accession #:    [id number] Date of Birth: 08-31-1941        Patient Gender: F Patient Age:   89 years Exam Location:  [facility] Procedure:      VAS [place] UPPER EXTREMITY ARTERIAL DUPLEX Referring Phys: Scheryl Marten XU --------------------------------------------------------------------------------  Indications: Abnormal CT finding (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 History:     Patient has a history of Currently admitted for CVA.  Risk Factors:  Hypertension, hyperlipidemia,  current smoker. Other Factors: CHF, ICD, PAD Comparison Study: No previous exams Performing Technologist: Jody Hill RVT, RDMS  Examination Guidelines: A complete evaluation includes B-mode imaging, spectral Doppler, color Doppler, and power Doppler as needed of all accessible portions of each vessel. Bilateral testing is considered an integral part of a complete examination. Limited examinations for reoccurring indications may be performed as noted.  Right Doppler Findings: +---------------+----------+----------+-------------+--------+ Site           PSV (cm/s)Waveform  Stenosis     Comments +---------------+----------+----------+-------------+--------+ Subclavian Prox176       monophasic                      +---------------+----------+----------+-------------+--------+ Subclavian Mid 296       monophasic>50% stenosis         +---------------+----------+----------+-------------+--------+ Subclavian Dist310       monophasic>50% stenosis         +---------------+----------+----------+-------------+--------+ Axillary       532       monophasic>50% stenosis         +---------------+----------+----------+-------------+--------+ Brachial Prox  116       monophasic                      +---------------+----------+----------+-------------+--------+ Brachial Mid   99        monophasic                      +---------------+----------+----------+-------------+--------+  Brachial Dist  102       monophasic                      +---------------+----------+----------+-------------+--------+ Radial Mid     72        monophasic                      +---------------+----------+----------+-------------+--------+ Radial Dist    58        monophasic                      +---------------+----------+----------+-------------+--------+ Ulnar Mid      37        monophasic>50% stenosis         +---------------+----------+----------+-------------+--------+ Ulnar Dist     35         monophasic>50% stenosis         +---------------+----------+----------+-------------+--------+ Palmar Arch    28        monophasic                      +---------------+----------+----------+-------------+--------+    Summary:  Right: Post stenotic waveform in proximal subclavian may indicate a        more proximal occlusion. 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 *See table(s) above for measurements and observations. Suggest Peripheral Vascular Consult. Electronically signed by Sherald Hess MD on 08/26/2023 at 1:46:39 PM.    Final    DG Abd 2 Views  Result Date: 08/25/2023 CLINICAL DATA:  [id number] Vomiting [id number] EXAM: ABDOMEN - 2 VIEW COMPARISON:  None Available. FINDINGS: The bowel gas pattern is normal. There is no evidence of free air. No radio-opaque calculi or other significant radiographic abnormality is seen. Excreted contrast within the urinary bladder. IMPRESSION: Negative. Electronically Signed   By: Duanne Guess D.O.   On: 08/25/2023 21:45   DG Pelvis 1-2 Views  Result Date: 08/25/2023 CLINICAL DATA:  Pain EXAM: PELVIS - 1-2 VIEW COMPARISON:  None Available. FINDINGS: There is no evidence of pelvic fracture or diastasis. Hip joints are intact. Excreted contrast within the urinary bladder. IMPRESSION: Negative. Electronically Signed   By: Duanne Guess D.O.   On: 08/25/2023 21:44   ECHOCARDIOGRAM COMPLETE  Result Date: 08/25/2023    ECHOCARDIOGRAM REPORT   Patient Name:   BERTILLE NORWOOD Date of Exam: 08/25/2023 Medical Rec #:  [id number]        Height:       67.0 in Accession #:    [id number]       Weight:       197.5 lb Date of Birth:  02-13-1941       BSA:          2.012 m Patient Age:    82 years         BP:           181/92 mmHg Patient Gender: F                HR:           88 bpm. Exam Location:  Inpatient Procedure: 2D Echo, Cardiac Doppler, Color Doppler and Saline Contrast Bubble             Study Indications:    Stroke  History:        Patient has prior history of Echocardiogram examinations, most  recent 06/20/2015. CHF, Abnormal ECG and Defibrillator,                 Arrythmias:LBBB, Signs/Symptoms:Dizziness/Lightheadedness; Risk                 Factors:Hypertension, Current Smoker and Dyslipidemia.  Sonographer:    Sheralyn Boatman RDCS Referring Phys: [id number] [facility] GOEL IMPRESSIONS  1. There is apical LV cavity systolic obliteration with a dynamic "gradient" of up to 2 m/s. There is no evidence of LV outflow obstruction and there is no systolic anterior motion of the mitral valve at rest or with the Valsalva maneuver. Left ventricular ejection fraction, by estimation, is 45 to 50%. The left ventricle has mildly decreased function. The left ventricle has no regional wall motion abnormalities. There is moderate asymmetric left ventricular hypertrophy of the basal-septal and apical segments. Indeterminate diastolic filling due to E-A fusion.  2. Right ventricular systolic function is normal. The right ventricular size is normal.  3. The mitral valve is degenerative. No evidence of mitral valve regurgitation. No evidence of mitral stenosis. Moderate mitral annular calcification.  4. The aortic valve is tricuspid. There is mild calcification of the aortic valve. There is moderate thickening of the aortic valve. Aortic valve regurgitation is trivial. Aortic valve sclerosis/calcification is present, without any evidence of aortic stenosis. Comparison(s): Prior images unable to be directly viewed, comparison made by report only. The left ventricular function has improved. FINDINGS  Left Ventricle: There is apical LV cavity systolic obliteration with a dynamic "gradient" of up to 2 m/s. There is no evidence of LV outflow obstruction and there is no systolic anterior motion of the mitral valve at rest or with the Valsalva maneuver. Left ventricular ejection fraction, by estimation, is 45 to 50%. The  left ventricle has mildly decreased function. The left ventricle has no regional wall motion abnormalities. The left ventricular internal cavity size was small. There is moderate asymmetric left ventricular hypertrophy of the basal-septal and apical segments. Abnormal (paradoxical) septal motion, consistent with left bundle branch block. Indeterminate diastolic filling due to E-A fusion. Right Ventricle: The right ventricular size is normal. No increase in right ventricular wall thickness. Right ventricular systolic function is normal. Left Atrium: Left atrial size was normal in size. Right Atrium: Right atrial size was normal in size. Pericardium: There is no evidence of pericardial effusion. Mitral Valve: The mitral valve is degenerative in appearance. Moderate mitral annular calcification. No evidence of mitral valve regurgitation. No evidence of mitral valve stenosis. Tricuspid Valve: The tricuspid valve is grossly normal. Tricuspid valve regurgitation is not demonstrated. Aortic Valve: The aortic valve is tricuspid. There is mild calcification of the aortic valve. There is moderate thickening of the aortic valve. Aortic valve regurgitation is trivial. Aortic valve sclerosis/calcification is present, without any evidence of aortic stenosis. Pulmonic Valve: The pulmonic valve was not well visualized. Pulmonic valve regurgitation is not visualized. Aorta: The aortic root is normal in size and structure. IAS/Shunts: No atrial level shunt detected by color flow Doppler. Agitated saline contrast was given intravenously to evaluate for intracardiac shunting. Additional Comments: A device lead is visualized in the right ventricle.  LEFT VENTRICLE PLAX 2D LVIDd:         2.60 cm     Diastology LVIDs:         2.00 cm     LV e' medial:    3.70 cm/s LV PW:         1.75 cm     LV E/e'  medial:  30.8 LV IVS:        2.15 cm     LV e' lateral:   4.35 cm/s LVOT diam:     1.90 cm     LV E/e' lateral: 26.2 LV SV:         56 LV SV  Index:   28 LVOT Area:     2.84 cm  LV Volumes (MOD) LV vol d, MOD A2C: 62.1 ml LV vol d, MOD A4C: 61.9 ml LV vol s, MOD A2C: 30.0 ml LV vol s, MOD A4C: 27.9 ml LV SV MOD A2C:     32.1 ml LV SV MOD A4C:     61.9 ml LV SV MOD BP:      39.3 ml RIGHT VENTRICLE             IVC RV S prime:     11.40 cm/s  IVC diam: 1.30 cm TAPSE (M-mode): 1.6 cm LEFT ATRIUM           Index        RIGHT ATRIUM          Index LA diam:      1.10 cm 0.55 cm/m   RA Area:     8.61 cm LA Vol (A2C): 17.4 ml 8.65 ml/m   RA Volume:   15.70 ml 7.80 ml/m LA Vol (A4C): 30.2 ml 15.01 ml/m  AORTIC VALVE LVOT Vmax:   116.00 cm/s LVOT Vmean:  77.700 cm/s LVOT VTI:    0.196 m  AORTA Ao Root diam: 3.30 cm MITRAL VALVE MV Area (PHT): 6.27 cm     SHUNTS MV Decel Time: 121 msec     Systemic VTI:  0.20 m MV E velocity: 114.00 cm/s  Systemic Diam: 1.90 cm Rachelle Hora Croitoru MD Electronically signed by Thurmon Fair MD Signature Date/Time: 08/25/2023/5:20:55 PM    Final    MR BRAIN WO CONTRAST  Result Date: 08/25/2023 CLINICAL DATA:  Provided history: Stroke, follow-up. EXAM: MRI HEAD WITHOUT CONTRAST TECHNIQUE: Multiplanar, multiecho pulse sequences of the brain and surrounding structures were obtained without intravenous contrast. COMPARISON:  Non-contrast head CT and CT angiogram head/neck 08/25/2023. FINDINGS: Brain: No age advanced or lobar predominant parenchymal atrophy. Mild cerebellar atrophy. 1.5 x 2.3 x 2.9 cm acute infarct within the right corona radiata/basal ganglia. Small acute infarcts within the right insula and right subinsular white matter. Chronic lacunar infarcts within bilateral cerebral hemispheric white matter and basal ganglia. Small focus of diffusion-weighted signal hyperintensity at site of a chronic infarct in the left basal ganglia, likely reflecting susceptibility artifact from chronic blood products at this site. Background advanced patchy and confluent T2 FLAIR hyperintense signal abnormality within the cerebral white  matter, nonspecific but compatible with chronic small vessel ischemic disease. Small chronic infarcts within the left cerebellar hemisphere. 9 mm extra-axial mass along the right aspect of the anterior falx, likely reflecting a meningioma (for instance as seen on series 11, image 41) (series 15, image 22). Contact upon the underlying right lobe without underlying parenchymal edema. There are a few chronic microhemorrhages scattered within the supratentorial brain. Partially empty sella turcica. No extra-axial fluid collection. No midline shift. Vascular: Maintained flow voids within the proximal large arterial vessels. 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 Skull and upper cervical spine: Indeterminate well-circumscribed 10 mm FLAIR hyperintense and T1 hypointense lesion within the left frontal calvarium (for instance as seen on series 11, image 44). Sinuses/Orbits: No  mass or acute finding within the imaged orbits. Minimal mucosal thickening within the right ethmoid and left maxillary sinuses. IMPRESSION: 1. 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 2. Small acute infarcts within the right insula and right subinsular white matter. 3. Background advanced chronic small ischemic disease with multiple chronic infarcts, as described. 4. 9 mm extra-axial mass along the right aspect of the anterior falx, likely reflecting a meningioma. Contact upon the underlying right frontal lobe without underlying parenchymal edema 5. Mild cerebellar atrophy. 6. Indeterminate 10 mm osseous lesion within the left frontal calvarium. Consider a follow-up brain MRI in 3 months to ensure stability. Electronically Signed   By: Jackey Loge D.O.   On: 08/25/2023 13:59   DG Chest Port 1 View  Result Date: 08/25/2023 CLINICAL DATA:  Weakness status post fall EXAM: PORTABLE CHEST 1 VIEW COMPARISON:  Chest radiograph dated 04/03/2021  FINDINGS: Lines/tubes: Left chest wall ICD leads project over the right atrium and ventricle and tributary of the coronary sinus. Lungs: Low lung volumes with bronchovascular crowding. No focal consolidation. Pleura: No pneumothorax or pleural effusion. Heart/mediastinum: The heart size and mediastinal contours are within normal limits. Bones: No radiographic finding of acute displaced fracture. IMPRESSION: 1. Low lung volumes with bronchovascular crowding. No focal consolidation. 2.  No radiographic finding of acute displaced fracture. Electronically Signed   By: Agustin Cree M.D.   On: 08/25/2023 10:29   CT ANGIO HEAD NECK W WO CM W PERF (CODE STROKE)  Result Date: 08/25/2023 CLINICAL DATA:  Neuro deficit, acute, stroke suspected EXAM: CT ANGIOGRAPHY HEAD AND NECK CT PERFUSION BRAIN TECHNIQUE: Multidetector CT imaging of the head and neck was performed using the standard protocol during bolus administration of intravenous contrast. Multiplanar CT image reconstructions and MIPs were obtained to evaluate the vascular anatomy. Carotid stenosis measurements (when applicable) are obtained utilizing NASCET criteria, using the distal internal carotid diameter as the denominator. Multiphase CT imaging of the brain was performed following IV bolus contrast injection. Subsequent parametric perfusion maps were calculated using RAPID software. RADIATION DOSE REDUCTION: This exam was performed according to the departmental dose-optimization program which includes automated exposure control, adjustment of the mA and/or kV according to patient size and/or use of iterative reconstruction technique. CONTRAST:  OMNIPAQUE IOHEXOL 350 MG/ML SOLN COMPARISON:  Same day CT head. FINDINGS: CTA NECK FINDINGS Aortic arch: Moderate stenosis of the right subclavian artery origin. Intraluminal filling defect within the proximal subclavian artery which is suspicious for thrombus. Ulcerated atherosclerosis of the left subclavian artery  origin with mild narrowing. Right carotid system: Atherosclerosis at the carotid bifurcation with approximately 30% stenosis of the ICA origin. Left carotid system: Atherosclerosis at the carotid bifurcation involving the proximal ICA with proximally 50% stenosis of the proximal ICA. Vertebral arteries: Severe bilateral vertebral artery origin stenosis. Both vertebral arteries are patent. Left dominant. Skeleton: No acute abnormality on limited assessment. Multilevel degenerative change in the cervical spine. Other neck: Subcentimeter thyroid nodules which do not require imaging follow-up (ref: J Am Coll Radiol. 2015 Feb;12(2): 143-50). Upper chest: Visualized lung apices are clear. Review of the MIP images confirms the above findings CTA HEAD FINDINGS Anterior circulation: Bilateral intracranial ICAs are patent with mild to moderate narrowing bilaterally. 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 Posterior circulation: Bilateral intradural vertebral arteries, basilar artery and bilateral posterior cerebral arteries are are patent. Moderate right proximal P2 PCA stenosis. Venous sinuses: As permitted  by contrast timing, patent. Review of the MIP images confirms the above findings CT Brain Perfusion Findings: ASPECTS: 10. CBF (<30%) Volume: 0mL Perfusion (Tmax>6.0s) volume: 0mL Mismatch Volume: 0mL Infarction Location:None identified. IMPRESSION: CTA head: 1. No emergent large vessel occlusion. 2. Moderate right proximal P2 PCA stenosis. 3. 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 CTA neck: 1. Approximately 50% stenosis of the proximal ICAs bilaterally in the neck. 2. Severe bilateral vertebral artery origin stenosis. 3. Moderate atherosclerotic narrowing of the right subclavian artery origin. Intraluminal filling defect within the subclavian artery in this region is suspicious for nonocclusive thrombus versus atherosclerotic plaque  extending into the lumen. Right upper extremity ultrasound may be useful to further evaluate and also exclude more distal clot in the arm. 4. Aortic Atherosclerosis (ICD10-I70.0). CT perfusion: No evidence of core infarct or penumbra. Electronically Signed   By: Feliberto Harts M.D.   On: 08/25/2023 09:39   CT HEAD CODE STROKE WO CONTRAST  Result Date: 08/25/2023 CLINICAL DATA:  Code stroke. Neuro deficit with acute stroke suspected facial droop with left arm and leg weakness. EXAM: CT HEAD WITHOUT CONTRAST TECHNIQUE: Contiguous axial images were obtained from the base of the skull through the vertex without intravenous contrast. RADIATION DOSE REDUCTION: This exam was performed according to the departmental dose-optimization program which includes automated exposure control, adjustment of the mA and/or kV according to patient size and/or use of iterative reconstruction technique. COMPARISON:  None Available. FINDINGS: Brain: No evidence of acute infarction, hemorrhage, hydrocephalus, extra-axial collection or mass lesion/mass effect. Extensive chronic small vessel ischemia with gliosis and lacunar infarcts in the deep gray nuclei. None of these are clearly acute. Small chronic infarct in the left cerebellum. Vascular: Limited at the proximal MCA due to streak artifact. No convincing hyperdensity. Skull: No acute or aggressive finding. Lucency at the superior left frontal bone is likely a incidental hemangioma. Sinuses/Orbits: No acute finding. Other: These results were communicated to Dr. Wilford Corner at 8:44 am on 08/25/2023 by epic chat. ASPECTS [facility] Stroke Program Early CT Score) - Ganglionic level infarction (caudate, lentiform nuclei, internal capsule, insula, M1-M3 cortex): 7 - Supraganglionic infarction (M4-M6 cortex): 3 Total score (0-10 with 10 being normal): 10 IMPRESSION: 1. No acute finding. 2. Extensive chronic small vessel ischemia. Electronically Signed   By: Tiburcio Pea M.D.   On: 08/25/2023  08:46     PHYSICAL EXAM  Temp:  [97.5 F (36.4 C)-98.8 F (37.1 C)] 97.5 F (36.4 C) (12/03 1510) Pulse Rate:  [71-92] 82 (12/03 1510) Resp:  [18-34] 26 (12/03 1510) BP: (58-135)/(27-61) 97/51 (12/03 1510) SpO2:  [88 %-97 %] 96 % (12/03 1510) Weight:  [95.1 kg] 95.1 kg (12/03 1042)  General - Well nourished, well developed, drowsy and barely arousable  Ophthalmologic - fundi not visualized due to noncooperation.  Cardiovascular - Regular rhythm and rate.  Neuro - drowsy sleepy and needed constant stimulation to barely open her eyes.  She is not speaking but does follow simple one-step commands in midline and on the right arm and leg.. No gaze palsy, tracking bilaterally, visual field full. Left facial droop.  Dense left hemiplegia but will withdraw slightly to painful stimulus.  Purposeful movements on the right..  , gait not tested.     ASSESSMENT/PLAN Ms. DONATA KREKELER is a 82 y.o. female with history of cardiomyopathy and CHF with pacemaker, hypertension, smoker, PAD admitted for found down at home with left-sided weakness, left facial droop and slurred speech. No tPA given  due to outside window.    Stroke:  right BG/CR and 2 small insular cortex infarcts, concerning secondary to large vessel disease from moderate right ICA stenosis..  Recent subacute decline in mental status of unclear etiology.  Possibilities include interval new strokes not seen on CT scan versus silent seizures.  No clear metabolic derangement to explain worsening CT no acute abnormality CT head and neck bilateral ICA proximal 50% stenosis, right P2 severe stenosis, bilateral VA origin stenosis, right subclavian artery proximal ulcerated plaque MRI right BG/CRinfarct and 2 small insular and subinsular infarcts. 2D Echo EF 40 to 45%, LV cavity systolic obliteration Pacemaker interrogation no afib LDL 141 HgbA1c 5.4 UDS negative Lovenox for VTE prophylaxis No antithrombotic prior to admission, now on  aspirin 81 mg daily and clopidogrel 75 mg daily DAPT. Ongoing aggressive stroke risk factor management Therapy recommendations: CIR Disposition: Pending  Possible symptomatic right ICA stenosis CT head and neck bilateral ICA proximal 50% stenosis Carotid Doppler bilateral ICA 40 to 59% stenosis Vascular surgery consulted Dr. Chestine Spore is on board, offered right CEA and family refused Family will discuss with pt PCP  Cardiomyopathy Status post pacemaker Pacemaker interrogation pending EF 40 to 45% On Coreg and ARB PTA  Hypertension Stable on the high end gradually reach BP goal within 2-3 days. BP goal 130-160 before carotid revascularization Long term BP goal normotensive  Hyperlipidemia Home meds: Lipitor 20 LDL 141, goal < 70 Now on liver 80 Continue statin at discharge  Tobacco abuse Current smoker Smoking cessation counseling provided Pt is willing to quit  Other Stroke Risk Factors Advanced age Obesity, Body mass index is 32.84 kg/m.  PAD - R axillary artery chronic stenosis on aterial Doppler, right subclavian artery proximal ulcerated plaque  Other Active Problems Leukocytosis WBC 13.9--12.5  Hospital day # 6  Patient will recent right hemispheric subcortical infarcts possibly from symptomatic moderate proximal carotid stenosis who has had neurological decline in the last few days.  Repeat CT shows no new stroke but clearly she could have progressed with interval new strokes.  Recommend check EEG for seizure activity and if unyielding consider getting an MRI.  Recommend IV hydration.  Prognosis is guarded.  Long discussion with daughter at the bedside and answered questions.  Family meeting to discuss goals of care plan for tomorrow with Dr. Nelson Chimes.  Neurology will be available if needed kindly call for questions.  Greater than 50% time during this 50-minute visit was spent in counseling and coordination of care about her stroke and neurological worsening and discussion  with patient daughter and Dr. Nelson Chimes and answered questions   Stroke Neurology 08/31/2023 4:29 PM    To contact Stroke Continuity provider, please refer to [email or web address]. After hours, contact General Neurology

## 2023-08-31 NOTE — Plan of Care (Signed)
  Problem: Education: Goal: Knowledge of disease or condition will improve 08/31/2023 1620 by Verita Lamb, RN Outcome: Progressing 08/31/2023 1619 by Verita Lamb, RN Outcome: Progressing Goal: Knowledge of secondary prevention will improve (MUST DOCUMENT ALL) Outcome: Progressing Goal: Knowledge of patient specific risk factors will improve Loraine Leriche N/A or DELETE if not current risk factor) Outcome: Progressing   Problem: Nutrition: Goal: Dietary intake will improve Outcome: Progressing   Problem: Clinical Measurements: Goal: Ability to maintain clinical measurements within normal limits will improve Outcome: Progressing Goal: Diagnostic test results will improve Outcome: Progressing

## 2023-08-31 NOTE — Progress Notes (Signed)
Brief Nutrition Support Note  TF initiation held after cortrak placement 12/3 due to MD concern with respiratory status. Rapid response called overnight and pt with low blood pressure. Received 3L of fluid overnight and hypotension improved.  MD and PMT in discussions with family on GOC. Ok to start TF today at half rate and MD plans to adjust IVF rate. If pt tolerates and if within scope of GOC, will begin adjusting to goal rate 12/4.  INTERVENTION:  Initiate tube feeding via cortrak tube: Osmolite 1.5 at 25 ml/h (600 ml per day) Prosource TF20 60 ml BID 100 ml free water every 6 hours Provides 1060 kcal, 78 gm protein, 457 ml free water daily (TF+flush = free water)   If tolerated and within scope of care increase by 10 ml q8h to goal: Osmolite 1.5 at 45 ml/h (1080 ml per day) 100 ml free water every 6 hours Prosource TF20 60 ml BID Goal provides 1780 kcal, 107 gm protein, 820 ml free water daily (TF+flush=1232mL free water daily)     Greig Castilla, RD, LDN Registered Dietitian II RD pager # available in [facility]  After hours/weekend pager # available in [facility]

## 2023-08-31 NOTE — Progress Notes (Addendum)
I spoke with the patient's daughter Nicole Chan to provide her with an update. She understands that at this give, Mrs Hromadka condition is guarded. Low threshold to upgrade her level of care for closer neuro monitor, pressors, and respiratory status. Nicole Chan is clear that the patient is to remain DNR DNI at this time. In the meantime, we will proceed with all the necessary interventions. All the questions were answered by me. Low threshold to upgrade level of care if needed.   Please call with any further questions as needed.    Addendum 645pm: RN reported ptn is now on NRB. Unable to manage this on the floor.  Case discussed with Drs Vassie Loll and Trudie Buckler.  Will tx ptn to ICU for closer monitoring.  Nicole Chan, daughter, aware.   Stephania Fragmin MD

## 2023-08-31 NOTE — Progress Notes (Signed)
NSG 0700-1900: This RN in frequent contact with Dr. Nelson Chimes throughout the shift.  Pt's BP soft, 500 ml bolus given x2. Urine is brown in color, decreased output this shift.  Bladder scanned for 0.  RR remains elevated.  Mews has been yellow/red.  Pt on continuous monitoring.  On 15L high flow, 02 Sat hovering around 87/88%.  RT at the bedside, pt placed on a non-rebreather, Sat 92%.  Dr Nelson Chimes aware, awaiting additional orders at this time.  Family at the bedside.  WCTM

## 2023-08-31 NOTE — Progress Notes (Signed)
PHARMACY NOTE:  ANTIMICROBIAL RENAL DOSAGE ADJUSTMENT  Current antimicrobial regimen includes a mismatch between antimicrobial dosage and estimated renal function.  As per policy approved by the Pharmacy & Therapeutics and Medical Executive Committees, the antimicrobial dosage will be adjusted accordingly.  Current antimicrobial dosage:  Unasyn 3g IV Q6H   Indication: Aspiration Pneumonia   Renal Function:  Estimated Creatinine Clearance: 29.3 mL/min (A) (by C-G formula based on SCr of 1.75 mg/dL (H)). []      On intermittent HD, scheduled: []      On CRRT    Antimicrobial dosage has been changed to:  Unasyn 3g IV Q8H   Additional comments: Patient with borderline renal function. If renal function declines further, will require further dose adjustment.    Thank you for allowing pharmacy to be a part of this patient's care.  Jani Gravel, PharmD Clinical Pharmacist  08/31/2023 7:38 AM

## 2023-09-01 ENCOUNTER — Inpatient Hospital Stay (HOSPITAL_COMMUNITY): Payer: Medicare Other

## 2023-09-01 DIAGNOSIS — R0609 Other forms of dyspnea: Secondary | ICD-10-CM | POA: Diagnosis not present

## 2023-09-01 DIAGNOSIS — I63011 Cerebral infarction due to thrombosis of right vertebral artery: Secondary | ICD-10-CM | POA: Diagnosis not present

## 2023-09-01 DIAGNOSIS — T17908A Unspecified foreign body in respiratory tract, part unspecified causing other injury, initial encounter: Secondary | ICD-10-CM | POA: Diagnosis not present

## 2023-09-01 DIAGNOSIS — Z515 Encounter for palliative care: Secondary | ICD-10-CM | POA: Diagnosis not present

## 2023-09-01 DIAGNOSIS — J9601 Acute respiratory failure with hypoxia: Secondary | ICD-10-CM | POA: Diagnosis not present

## 2023-09-01 DIAGNOSIS — I63231 Cerebral infarction due to unspecified occlusion or stenosis of right carotid arteries: Secondary | ICD-10-CM | POA: Diagnosis not present

## 2023-09-01 DIAGNOSIS — D72829 Elevated white blood cell count, unspecified: Secondary | ICD-10-CM | POA: Diagnosis not present

## 2023-09-01 LAB — CBC
HCT: 33.2 % — ABNORMAL LOW (ref 36.0–46.0)
Hemoglobin: 10.9 g/dL — ABNORMAL LOW (ref 12.0–15.0)
MCH: 29.8 pg (ref 26.0–34.0)
MCHC: 32.8 g/dL (ref 30.0–36.0)
MCV: 90.7 fL (ref 80.0–100.0)
Platelets: 237 10*3/uL (ref 150–400)
RBC: 3.66 MIL/uL — ABNORMAL LOW (ref 3.87–5.11)
RDW: 14.3 % (ref 11.5–15.5)
WBC: 13.7 10*3/uL — ABNORMAL HIGH (ref 4.0–10.5)
nRBC: 0 % (ref 0.0–0.2)

## 2023-09-01 LAB — GLUCOSE, CAPILLARY
Glucose-Capillary: 168 mg/dL — ABNORMAL HIGH (ref 70–99)
Glucose-Capillary: 181 mg/dL — ABNORMAL HIGH (ref 70–99)

## 2023-09-01 LAB — LACTIC ACID, PLASMA: Lactic Acid, Venous: 1.5 mmol/L (ref 0.5–1.9)

## 2023-09-01 LAB — BASIC METABOLIC PANEL
Anion gap: 10 (ref 5–15)
BUN: 129 mg/dL — ABNORMAL HIGH (ref 8–23)
CO2: 16 mmol/L — ABNORMAL LOW (ref 22–32)
Calcium: 9.3 mg/dL (ref 8.9–10.3)
Chloride: 113 mmol/L — ABNORMAL HIGH (ref 98–111)
Creatinine, Ser: 2.37 mg/dL — ABNORMAL HIGH (ref 0.44–1.00)
GFR, Estimated: 20 mL/min — ABNORMAL LOW (ref >60)
Glucose, Bld: 174 mg/dL — ABNORMAL HIGH (ref 70–99)
Potassium: 3.1 mmol/L — ABNORMAL LOW (ref 3.5–5.1)
Sodium: 139 mmol/L (ref 135–145)

## 2023-09-01 LAB — PHOSPHORUS: Phosphorus: 3.1 mg/dL (ref 2.5–4.6)

## 2023-09-01 MED ORDER — LORAZEPAM 2 MG/ML IJ SOLN: 1.0000 mg | INTRAMUSCULAR | Status: DC | PRN

## 2023-09-01 MED ORDER — LORAZEPAM 2 MG/ML IJ SOLN
INTRAMUSCULAR | Status: AC
  Administered 2023-09-01: 0.5 mg via INTRAVENOUS
  Filled 2023-09-01: qty 1

## 2023-09-01 MED ORDER — MORPHINE SULFATE (PF) 2 MG/ML IV SOLN: 2.0000 mg | Freq: Once | INTRAVENOUS | Status: AC

## 2023-09-01 MED ORDER — ASPIRIN 81 MG PO CHEW: 81.0000 mg | CHEWABLE_TABLET | Freq: Every day | ORAL | Status: DC

## 2023-09-01 MED ORDER — HALOPERIDOL LACTATE 2 MG/ML PO CONC: 0.5000 mg | ORAL | Status: DC | PRN

## 2023-09-01 MED ORDER — BACLOFEN 10 MG PO TABS: 5.0000 mg | ORAL_TABLET | Freq: Two times a day (BID) | ORAL | Status: DC | PRN

## 2023-09-01 MED ORDER — CLOPIDOGREL BISULFATE 75 MG PO TABS: 75.0000 mg | ORAL_TABLET | Freq: Every day | ORAL | Status: DC

## 2023-09-01 MED ORDER — ONDANSETRON 4 MG PO TBDP: 4.0000 mg | ORAL_TABLET | Freq: Four times a day (QID) | ORAL | Status: DC | PRN

## 2023-09-01 MED ORDER — FUROSEMIDE 10 MG/ML IJ SOLN: 40.0000 mg | Freq: Once | INTRAMUSCULAR | Status: AC

## 2023-09-01 MED ORDER — SENNA 8.6 MG PO TABS
1.0000 | ORAL_TABLET | Freq: Every evening | ORAL | Status: DC | PRN
Start: 2023-09-01 — End: 2023-09-01

## 2023-09-01 MED ORDER — BIOTENE DRY MOUTH MT LIQD: 15.0000 mL | OROMUCOSAL | Status: DC | PRN

## 2023-09-01 MED ORDER — ACETAMINOPHEN 650 MG RE SUPP: 650.0000 mg | Freq: Four times a day (QID) | RECTAL | Status: DC | PRN

## 2023-09-01 MED ORDER — MORPHINE SULFATE (CONCENTRATE) 10 MG /0.5 ML PO SOLN: 5.0000 mg | ORAL | Status: DC | PRN

## 2023-09-01 MED ORDER — LORAZEPAM 1 MG PO TABS: 1.0000 mg | ORAL_TABLET | ORAL | Status: DC | PRN

## 2023-09-01 MED ORDER — VITAMIN D 25 MCG (1000 UNIT) PO TABS: 1000.0000 [IU] | ORAL_TABLET | Freq: Every day | ORAL | Status: DC

## 2023-09-01 MED ORDER — MORPHINE 100MG IN NS 100ML (1MG/ML) PREMIX INFUSION
3.0000 mg/h | INTRAVENOUS | Status: DC
  Administered 2023-09-01: 3 mg/h via INTRAVENOUS
  Filled 2023-09-01: qty 100

## 2023-09-01 MED ORDER — LORAZEPAM 2 MG/ML IJ SOLN
1.0000 mg | INTRAMUSCULAR | Status: DC
  Administered 2023-09-01: 1 mg via INTRAVENOUS
  Filled 2023-09-01: qty 1

## 2023-09-01 MED ORDER — ATORVASTATIN CALCIUM 80 MG PO TABS: 80.0000 mg | ORAL_TABLET | Freq: Every day | ORAL | Status: DC

## 2023-09-01 MED ORDER — ACETAMINOPHEN 325 MG PO TABS: 650.0000 mg | ORAL_TABLET | Freq: Four times a day (QID) | ORAL | Status: DC | PRN

## 2023-09-01 MED ORDER — BUDESONIDE 0.25 MG/2ML IN SUSP
RESPIRATORY_TRACT | Status: AC
  Administered 2023-09-01: 0.25 mg
  Filled 2023-09-01: qty 2

## 2023-09-01 MED ORDER — ONDANSETRON HCL 4 MG/2ML IJ SOLN: 4.0000 mg | Freq: Four times a day (QID) | INTRAMUSCULAR | Status: DC | PRN

## 2023-09-01 MED ORDER — ATROPINE SULFATE 1 % OP SOLN: 4.0000 [drp] | OPHTHALMIC | Status: DC | PRN

## 2023-09-01 MED ORDER — MORPHINE SULFATE (PF) 2 MG/ML IV SOLN
2.0000 mg | INTRAVENOUS | Status: DC | PRN
  Administered 2023-09-01: 2 mg via INTRAVENOUS
  Filled 2023-09-01: qty 1

## 2023-09-01 MED ORDER — HALOPERIDOL LACTATE 5 MG/ML IJ SOLN: 0.5000 mg | INTRAMUSCULAR | Status: DC | PRN

## 2023-09-01 MED ORDER — THIAMINE MONONITRATE 100 MG PO TABS: 100.0000 mg | ORAL_TABLET | Freq: Every day | ORAL | Status: DC

## 2023-09-01 MED ORDER — ADULT MULTIVITAMIN W/MINERALS CH: 1.0000 | ORAL_TABLET | Freq: Every day | ORAL | Status: DC

## 2023-09-01 MED ORDER — HALOPERIDOL 0.5 MG PO TABS: 0.5000 mg | ORAL_TABLET | ORAL | Status: DC | PRN

## 2023-09-01 MED ORDER — LORAZEPAM 2 MG/ML PO CONC: 1.0000 mg | ORAL | Status: DC | PRN

## 2023-09-01 MED ORDER — FUROSEMIDE 10 MG/ML IJ SOLN
INTRAMUSCULAR | Status: AC
  Administered 2023-09-01: 40 mg via INTRAVENOUS
  Filled 2023-09-01: qty 4

## 2023-09-01 MED ORDER — TRAZODONE HCL 50 MG PO TABS: 25.0000 mg | ORAL_TABLET | Freq: Every evening | ORAL | Status: DC | PRN

## 2023-09-01 MED ORDER — MORPHINE SULFATE (PF) 2 MG/ML IV SOLN
INTRAVENOUS | Status: AC
  Administered 2023-09-01: 2 mg via INTRAVENOUS
  Filled 2023-09-01: qty 1

## 2023-09-01 MED ORDER — POLYVINYL ALCOHOL 1.4 % OP SOLN: 1.0000 [drp] | Freq: Four times a day (QID) | OPHTHALMIC | Status: DC | PRN

## 2023-09-01 MED ORDER — OXYBUTYNIN CHLORIDE 5 MG PO TABS: 2.5000 mg | ORAL_TABLET | Freq: Four times a day (QID) | ORAL | Status: DC | PRN

## 2023-09-01 MED ORDER — DIPHENHYDRAMINE HCL 50 MG/ML IJ SOLN: 12.5000 mg | INTRAMUSCULAR | Status: DC | PRN

## 2023-09-01 MED ORDER — LORAZEPAM 2 MG/ML IJ SOLN: 0.5000 mg | Freq: Once | INTRAMUSCULAR | Status: AC

## 2023-09-01 MED ORDER — MAGIC MOUTHWASH: 15.0000 mL | Freq: Four times a day (QID) | ORAL | Status: DC | PRN

## 2023-09-01 MED ORDER — BUDESONIDE 0.5 MG/2ML IN SUSP: 0.5000 mg | Freq: Two times a day (BID) | RESPIRATORY_TRACT | Status: DC

## 2023-09-01 MED ORDER — BUDESONIDE 0.5 MG/2ML IN SUSP
RESPIRATORY_TRACT | Status: AC
  Administered 2023-09-01: 0.5 mg via RESPIRATORY_TRACT
  Filled 2023-09-01: qty 2

## 2023-09-02 LAB — GLUCOSE, CAPILLARY: Glucose-Capillary: 129 mg/dL — ABNORMAL HIGH (ref 70–99)

## 2023-09-04 LAB — PROTEIN ELECTROPHORESIS, SERUM
A/G Ratio: 1.1 (ref 0.7–1.7)
Albumin ELP: 3 g/dL (ref 2.9–4.4)
Alpha-1-Globulin: 0.2 g/dL (ref 0.0–0.4)
Alpha-2-Globulin: 0.9 g/dL (ref 0.4–1.0)
Beta Globulin: 0.9 g/dL (ref 0.7–1.3)
Gamma Globulin: 0.9 g/dL (ref 0.4–1.8)
Globulin, Total: 2.8 g/dL (ref 2.2–3.9)
Total Protein ELP: 5.8 g/dL — ABNORMAL LOW (ref 6.0–8.5)

## 2023-09-29 NOTE — Progress Notes (Addendum)
STROKE TEAM PROGRESS NOTE   SUBJECTIVE (INTERVAL HISTORY) Family at the bedside.  Patient had an episode of desaturation and hypotension, now on nonrebreather mask.  Likely she has aspirated.  Worsening leukocytosis 13.7, potassium 3.1, with worsening creatinine at 2.37 and BUN up to 129 Per chart review it appears she has continued to have respiratory decline and family has opted for transitioning to  full comfort care EEG shows diffuse slowing.  No seizure activity OBJECTIVE Temp:  [97.5 F (36.4 C)-98.5 F (36.9 C)] 98.5 F (36.9 C) (12/04 0753) Pulse Rate:  [55-101] 76 (12/04 1230) Cardiac Rhythm: Ventricular tachycardia (12/04 0714) Resp:  [26-35] 30 (12/04 1134) BP: (97-161)/(41-84) 104/54 (12/04 1134) SpO2:  [32 %-96 %] 32 % (12/04 1230) FiO2 (%):  [100 %] 100 % (12/04 1053)  Recent Labs  Lab 08/31/23 1109 08/31/23 2022 08/31/23 2348 09/13/2023 0426 09/25/2023 0754  GLUCAP 123* 189* 160* 168* 181*   Recent Labs  Lab 08/26/23 0444 08/27/23 0459 08/28/23 0415 08/29/23 0735 08/30/23 0503 08/30/23 1437 08/30/23 1844 08/31/23 0435 08/31/23 0436 09/18/2023 0611  NA  --    < > 137 138 141  --   --  140  --  139  K  --    < > 3.6 3.8 3.4*  --   --  3.5  --  3.1*  CL  --    < > 107 105 114*  --   --  112*  --  113*  CO2  --    < > 23 20* 19*  --   --  18*  --  16*  GLUCOSE  --    < > 158* 150* 142*  --   --  144*  --  174*  BUN  --    < > 19 48* 69*  --   --  97*  --  129*  CREATININE  --    < > 0.80 1.48* 1.43*  --   --  1.75*  --  2.37*  CALCIUM  --    < > 10.0 10.8* 10.1  --   --  9.7  --  9.3  MG  --   --   --   --   --  1.9 1.9 1.7  --   --   PHOS 3.1  --   --   --   --  3.5 2.7  --  2.9 3.1   < > = values in this interval not displayed.   Recent Labs  Lab 08/31/23 0435  AST 19  ALT 17  ALKPHOS 31*  BILITOT 2.7*  PROT 4.6*  ALBUMIN 1.8*   Recent Labs  Lab 08/25/23 1640 08/31/23 0435 09/16/2023 0611  WBC 12.5* 11.5* 13.7*  NEUTROABS  --  9.8*  --   HGB  14.3 10.9* 10.9*  HCT 42.3 33.7* 33.2*  MCV 86.5 89.4 90.7  PLT 217 186 237   Recent Labs  Lab 08/25/23 1535  CKTOTAL 144   No results for input(s): "LABPROT", "INR" in the last 72 hours.  Recent Labs    08/31/23 0930  COLORURINE BROWN*  LABSPEC TEST NOT REPORTED DUE TO COLOR INTERFERENCE OF URINE PIGMENT  PHURINE TEST NOT REPORTED DUE TO COLOR INTERFERENCE OF URINE PIGMENT  GLUCOSEU TEST NOT REPORTED DUE TO COLOR INTERFERENCE OF URINE PIGMENT*  HGBUR TEST NOT REPORTED DUE TO COLOR INTERFERENCE OF URINE PIGMENT*  BILIRUBINUR TEST NOT REPORTED DUE TO COLOR INTERFERENCE OF URINE PIGMENT*  KETONESUR TEST NOT REPORTED  DUE TO COLOR INTERFERENCE OF URINE PIGMENT*  PROTEINUR TEST NOT REPORTED DUE TO COLOR INTERFERENCE OF URINE PIGMENT*  NITRITE TEST NOT REPORTED DUE TO COLOR INTERFERENCE OF URINE PIGMENT*  LEUKOCYTESUR TEST NOT REPORTED DUE TO COLOR INTERFERENCE OF URINE PIGMENT*       Component Value Date/Time   CHOL 203 (H) 08/26/2023 0444   TRIG 136 08/26/2023 0444   HDL 35 (L) 08/26/2023 0444   CHOLHDL 5.8 08/26/2023 0444   VLDL 27 08/26/2023 0444   LDLCALC 141 (H) 08/26/2023 0444   Lab Results  Component Value Date   HGBA1C 5.4 08/25/2023      Component Value Date/Time   LABOPIA NONE DETECTED 08/25/2023 1035   COCAINSCRNUR NONE DETECTED 08/25/2023 1035   LABBENZ NONE DETECTED 08/25/2023 1035   AMPHETMU NONE DETECTED 08/25/2023 1035   THCU NONE DETECTED 08/25/2023 1035   LABBARB NONE DETECTED 08/25/2023 1035    No results for input(s): "ETH" in the last 168 hours.   I have personally reviewed the radiological images below and agree with the radiology interpretations.  DG Chest Port 1 View  Result Date: 09/18/2023 CLINICAL DATA:  Dyspnea. EXAM: PORTABLE CHEST 1 VIEW COMPARISON:  Radiographs 08/30/2023 and 08/25/2023. FINDINGS: 0756 hours. Patient remains rotated to the right. Left subclavian AICD leads and feeding tube are unchanged in position. The heart size and  mediastinal contours are stable with aortic atherosclerosis. There is increased opacity medially at the right lung base, partially obscuring medial aspect of the right hemidiaphragm, consistent with right lower lobe collapse or new infiltrate. The left lung appears stable with mild basilar atelectasis. No pneumothorax or significant pleural effusion. The bones appear unchanged. IMPRESSION: Increased opacity medially at the right lung base consistent with right lower lobe collapse or new infiltrate. Radiographic follow up recommended. Electronically Signed   By: Carey Bullocks M.D.   On: 09/09/2023 08:03   EEG adult  Result Date: 08/31/2023 Charlsie Quest, MD     08/31/2023  7:30 PM Patient Name: Nicole Chan MRN: [id number] Epilepsy Attending: Charlsie Quest Referring Physician/Provider: Miguel Rota, MD Date: 08/31/2023 Duration: 22.11 mins Patient history: 83 yo F with right BG/CR and 2 small insular cortex infarcts. EEG to evaluate for seizure Level of alertness: Asleep/ lethargic AEDs during EEG study: None Technical aspects: This EEG study was done with scalp electrodes positioned according to the 10-20 International system of electrode placement. Electrical activity was reviewed with band pass filter of 1-70Hz , sensitivity of 7 uV/mm, display speed of 67mm/sec with a 60Hz  notched filter applied as appropriate. EEG data were recorded continuously and digitally stored.  Video monitoring was available and reviewed as appropriate. Description: EEG showed continuous generalized predominantly 5 to 6 Hz theta slowing admixed with intermittent 2-3Hz  delta slowing. Hyperventilation and photic stimulation were not performed.   ABNORMALITY - Continuous slow, generalized IMPRESSION: This study is suggestive of moderate diffuse encephalopathy. No seizures or epileptiform discharges were seen throughout the recording. Charlsie Quest   DG CHEST PORT 1 VIEW  Result Date: 08/30/2023 CLINICAL DATA:  Hypoxia  and shortness of breath. EXAM: PORTABLE CHEST 1 VIEW COMPARISON:  Chest radiograph dated 08/25/2023. FINDINGS: Feeding tube with tip in the right hemiabdomen, likely in the distal stomach. Shallow inspiration with bibasilar atelectasis. No focal consolidation, pleural effusion, pneumothorax. Stable mild cardiomegaly. Left pectoral AICD device. No acute osseous pathology. IMPRESSION: Shallow inspiration with bibasilar atelectasis. Electronically Signed   By: Elgie Collard M.D.   On: 08/30/2023 17:51  CT HEAD WO CONTRAST ( )  Result Date: 08/30/2023 CLINICAL DATA:  Recent stroke, stroke suspected EXAM: CT HEAD WITHOUT CONTRAST TECHNIQUE: Contiguous axial images were obtained from the base of the skull through the vertex without intravenous contrast. RADIATION DOSE REDUCTION: This exam was performed according to the departmental dose-optimization program which includes automated exposure control, adjustment of the mA and/or kV according to patient size and/or use of iterative reconstruction technique. COMPARISON:  08/25/2023 CT head and MRI head FINDINGS: Brain: Increasing hypodensity in the right corona radiata and basal ganglia, consistent with expected evolution of the infarcts noted on the 08/25/2023 MRI. No evidence of hemorrhagic transformation. No new area of hypodensity to suggest additional acute infarct. No evidence of mass, mass effect, or midline shift. No hydrocephalus or extra-axial fluid collection. Periventricular white matter changes, likely the sequela of chronic small vessel ischemic disease. Remote infarcts in the left cerebellum and left basal ganglia. Vascular: No hyperdense vessel. Atherosclerotic calcifications in the intracranial carotid and vertebral arteries. Skull: Negative for fracture or focal lesion. Sinuses/Orbits: No acute finding. Other: The mastoid air cells are well aerated.  Nasogastric tube. IMPRESSION: Increasing hypodensity in the right corona radiata and basal ganglia,  consistent with expected evolution of the acute infarcts noted on the 08/25/2023 MRI. No evidence of hemorrhagic transformation. No new area of hypodensity to suggest additional acute infarct. Electronically Signed   By: Wiliam Ke M.D.   On: 08/30/2023 16:00   DG Abd Portable 1V  Result Date: 08/30/2023 CLINICAL DATA:  Feeding tube placement. EXAM: PORTABLE ABDOMEN - 1 VIEW COMPARISON:  Abdominal radiographs 08/25/2023. FINDINGS: 1200 hours. Single portable examination of the abdomen demonstrates the tip of a feeding tube projecting over the right upper quadrant of the abdomen, likely in the distal stomach or proximal duodenum. The visualized bowel gas pattern is normal. Cardiac AICD leads are partially imaged. IMPRESSION: Feeding tube tip projects over the distal stomach or proximal duodenum. Electronically Signed   By: Carey Bullocks M.D.   On: 08/30/2023 14:22   VAS US CAROTID  Result Date: 08/28/2023 Carotid Arterial Duplex Study Patient Name:  Nicole Chan  Date of Exam:   08/26/2023 Medical Rec #: [id number]         Accession #:    [id number] Date of Birth: March 13, 1941        Patient Gender: F Patient Age:   25 years Exam Location:  [facility] Procedure:      VAS US CAROTID Referring Phys: [facility] --------------------------------------------------------------------------------  Indications:   CVA. Risk Factors:  Hypertension, hyperlipidemia, current smoker. Other Factors: CHF, ICD, PAD. Performing Technologist: Ernestene Mention RVT, RDMS  Examination Guidelines: A complete evaluation includes B-mode imaging, spectral Doppler, color Doppler, and power Doppler as needed of all accessible portions of each vessel. Bilateral testing is considered an integral part of a complete examination. Limited examinations for reoccurring indications may be performed as noted.  Right Carotid Findings: +----------+--------+--------+--------+---------------------+------------------+           PSV  cm/sEDV cm/sStenosisPlaque Description   Comments           +----------+--------+--------+--------+---------------------+------------------+ CCA Prox  71      11              heterogenous                            +----------+--------+--------+--------+---------------------+------------------+ CCA Distal63      12  heterogenous                            +----------+--------+--------+--------+---------------------+------------------+ ICA Prox  142     24      40-59%  diffuse, irregular   diffuse calcific                                     and calcific         plaque and                                                                shadowing          +----------+--------+--------+--------+---------------------+------------------+ ICA Mid   70      14                                                      +----------+--------+--------+--------+---------------------+------------------+ ICA Distal75      18                                                      +----------+--------+--------+--------+---------------------+------------------+ ECA       241     21      >50%    heterogenous and                                                          calcific                                +----------+--------+--------+--------+---------------------+------------------+ +----------+--------+-------+--------+-------------------+           PSV cm/sEDV cmsDescribeArm Pressure (mmHG) +----------+--------+-------+--------+-------------------+ [id number]            Stenotic                    +----------+--------+-------+--------+-------------------+ +---------+--------+--+--------+-+ VertebralPSV cm/s35EDV cm/s7 +---------+--------+--+--------+-+  Left Carotid Findings: +----------+--------+--------+--------+---------------------+------------------+           PSV cm/sEDV cm/sStenosisPlaque Description   Comments            +----------+--------+--------+--------+---------------------+------------------+ CCA Prox  86      16              heterogenous         intimal thickening +----------+--------+--------+--------+---------------------+------------------+ CCA Distal63      12              heterogenous                            +----------+--------+--------+--------+---------------------+------------------+ ICA Prox  136     26  diffuse, irregular,                                                       calcific and                                                              heterogenous                            +----------+--------+--------+--------+---------------------+------------------+ ICA Mid   122     26                                                      +----------+--------+--------+--------+---------------------+------------------+ ICA Distal94      14                                                      +----------+--------+--------+--------+---------------------+------------------+ ECA       109     0               heterogenous                            +----------+--------+--------+--------+---------------------+------------------+ +----------+--------+--------+--------+-------------------+           PSV cm/sEDV cm/sDescribeArm Pressure (mmHG) +----------+--------+--------+--------+-------------------+ [id number]             Stenotic                    +----------+--------+--------+--------+-------------------+ +---------+--------+--+--------+-+ VertebralPSV cm/s55EDV cm/s8 +---------+--------+--+--------+-+   Summary: Right Carotid: Velocities in the right ICA are consistent with a 40-59%                stenosis. The ECA appears >50% stenosed. Left Carotid: Velocities in the left ICA are consistent with a 40-59% stenosis. Vertebrals:  Bilateral vertebral arteries demonstrate antegrade flow. Subclavians: Bilateral subclavian  arteries were stenotic. *See table(s) above for measurements and observations.  Electronically signed by Delia Heady MD on 08/28/2023 at 12:52:56 PM.    Final    DG Swallowing Func-Speech Pathology  Result Date: 08/27/2023 Table formatting from the original result was not included. Modified Barium Swallow Study Patient Details Name: Nicole Chan MRN: [id number] Date of Birth: 1941-02-15 Today's Date: 08/27/2023 HPI/PMH: HPI: Nicole Chan is an 83 yr old female who [facility] via EMS on 08/25/2023. She was found down by family. EMS activated code stroke alert for left sided weakness and questionable rt gaze preference. 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 Small acute infarcts within the right insula and right subinsular  white matter.   PMH of CHF, Cardiomyopathy, HTN, pacemaker. Clinical Impression: Clinical Impression: Pt presents with a moderate sensorimotor oropharyngeal dysphagia. There was  impaired oral control with residue on tongue and left lateral sulcus. Pharyngeal swallow onset occurred at the level of the pyriforms, with spillage into the larynx and trace, silent aspiration of thin liquids.  Nectar thick liquids reached the level of the vocal folds without eliciting a cough response. Laryngeal vestibule closure was adequate but often occurred too late to be fully protective. There was reduced pharyngeal squeeze and base of tongue contact, leading to mild residue primarily in the valleculae, with notable retrograde flow of some residue from lower pharynx into mid-pharynx.  There was partial obstruction of flow through PES and occasional trace backflow observed to squeeze upward through PES.  Technical difficulty prevented adequate screen of the esophagus. A head turn to the left was effective in preventing penetration/aspiration with nectar-thick liquids.  Pt has good potential for recovery of swallow - recommend continuing dysphagia 1 for now;  allow nectar thick liquids but pt must take single sips and turn head over left should for each swallow. Meds should be crushed.  Her daughter [name] was present for study and we reviewed results and recs; pt/dtr agree with plan. SLP will follow. Factors that may increase risk of adverse event in presence of aspiration [name] [employer] [name] 2021): No data recorded Recommendations/Plan: Swallowing Evaluation Recommendations Swallowing Evaluation Recommendations Recommendations: PO diet PO Diet Recommendation: Dysphagia 1 (Pureed); Mildly thick liquids (Level 2, nectar thick) Liquid Administration via: Spoon; Cup; Straw Medication Administration: Crushed with puree Supervision: Full assist for feeding Swallowing strategies  : Head turn left during swallowing; Slow rate; Small bites/sips Postural changes: Position pt fully upright for meals Oral care recommendations: Oral care BID (2x/day) Treatment Plan Treatment Plan Treatment recommendations: Therapy as outlined in treatment plan below Follow-up recommendations: Acute inpatient rehab (3 hours/day) Functional status assessment: Patient has had a recent decline in their functional status and demonstrates the ability to make significant improvements in function in a reasonable and predictable amount of time. Treatment frequency: Min 2x/week Treatment duration: 2 weeks Interventions: Aspiration precaution training; Patient/family education; Trials of upgraded texture/liquids; Compensatory techniques; Oropharyngeal exercises Recommendations Recommendations for follow up therapy are one component of a multi-disciplinary discharge planning process, led by the attending physician.  Recommendations may be updated based on patient status, additional functional criteria and insurance authorization. Assessment: Orofacial Exam: Orofacial Exam Oral Cavity: Oral Hygiene: WFL Oral Cavity - Dentition: Edentulous Orofacial Anatomy: WFL Oral Motor/Sensory Function: Suspected cranial  nerve impairment CN V - Trigeminal: Left sensory impairment CN VII - Facial: Left motor impairment Anatomy: Anatomy: Suspected cervical osteophytes Boluses Administered: Boluses Administered Boluses Administered: Thin liquids (Level 0); Mildly thick liquids (Level 2, nectar thick); Moderately thick liquids (Level 3, honey thick); Puree  Oral Impairment Domain: Oral Impairment Domain Lip Closure: Interlabial escape, no progression to anterior lip Tongue control during bolus hold: Cohesive bolus between tongue to palatal seal Bolus preparation/mastication: -- (n/a) Bolus transport/lingual motion: Delayed initiation of tongue motion (oral holding) Oral residue: Residue collection on oral structures Location of oral residue : Tongue; Floor of mouth Initiation of pharyngeal swallow : Pyriform sinuses  Pharyngeal Impairment Domain: Pharyngeal Impairment Domain Soft palate elevation: No bolus between soft palate (SP)/pharyngeal wall (PW) Laryngeal elevation: Complete superior movement of thyroid cartilage with complete approximation of arytenoids to epiglottic petiole Anterior hyoid excursion: Complete anterior movement Epiglottic movement: Complete inversion Laryngeal vestibule closure: Incomplete, narrow column air/contrast in laryngeal vestibule Pharyngeal stripping wave : Present - diminished Pharyngeal contraction (A/P view only): N/A Pharyngoesophageal segment opening: Partial distention/partial duration, partial obstruction  of flow Tongue base retraction: Narrow column of contrast or air between tongue base and PPW Pharyngeal residue: Collection of residue within or on pharyngeal structures Location of pharyngeal residue: Tongue base; Valleculae; Pharyngeal wall; Pyriform sinuses  Esophageal Impairment Domain: Esophageal Impairment Domain Esophageal clearance upright position: -- (tech difficulties viewing esophagus) Pill: Pill Consistency administered: -- (NT) Penetration/Aspiration Scale Score:  Penetration/Aspiration Scale Score 1.  Material does not enter airway: Puree; Moderately thick liquids (Level 3, honey thick) 5.  Material enters airway, CONTACTS cords and not ejected out: Mildly thick liquids (Level 2, nectar thick) 8.  Material enters airway, passes BELOW cords without attempt by patient to eject out (silent aspiration) : Thin liquids (Level 0) Compensatory Strategies: No data recorded  General Information: Caregiver present: Yes  Diet Prior to this Study: Dysphagia 1 (pureed); Thin liquids (Level 0)   Temperature : Normal   Respiratory Status: WFL   Supplemental O2: None (Room air)   History of Recent Intubation: No  Behavior/Cognition: Alert Self-Feeding Abilities: Needs assist with self-feeding Baseline vocal quality/speech: Normal Volitional Cough: Able to elicit Volitional Swallow: Able to elicit Exam Limitations: Limited visibility Goal Planning: Prognosis for improved oropharyngeal function: Good No data recorded No data recorded No data recorded Consulted and agree with results and recommendations: Patient; Family member/caregiver Pain: Pain Assessment Pain Assessment: No/denies pain Faces Pain Scale: 4 Pain Location: generalized Pain Descriptors / Indicators: Guarding; Grimacing Pain Intervention(s): Monitored during session End of Session: Start Time:SLP Start Time (ACUTE ONLY): 1430 Stop Time: SLP Stop Time (ACUTE ONLY): 1500 Time Calculation:SLP Time Calculation (min) (ACUTE ONLY): 30 min Charges: SLP Evaluations $ SLP Speech Visit: 1 Visit SLP Evaluations $MBS Swallow: 1 Procedure $ SLP EVAL LANGUAGE/SOUND PRODUCTION: 1 Procedure $Swallowing Treatment: 1 Procedure SLP visit diagnosis: SLP Visit Diagnosis: Dysphagia, oropharyngeal phase (R13.12) Past Medical History: Past Medical History: Diagnosis Date  Arthritis   "maybe in my hands/fingers; not terrible" (07/12/2015)  Chronic systolic CHF (congestive heart failure) (HCC)   Dilated cardiomyopathy (HCC)   Episode of dizziness,  secondary to cardiomyopathy possibly  10/25/2014  Essential hypertension   Hypertensive heart disease   LBBB (left bundle branch block)   a. noted on 10/24/14 admission for dizziness   NICM (nonischemic cardiomyopathy) (HCC)   a. Cath 10/29/14: normal coronary arteries (tortuous, compatible with hypertension). b. s/p [employer] BiV-ICD 06/2015.  Tobacco abuse  Past Surgical History: Past Surgical History: Procedure Laterality Date  ABDOMINAL HYSTERECTOMY  1970's  APPENDECTOMY    BI-VENTRICULAR IMPLANTABLE CARDIOVERTER DEFIBRILLATOR  (CRT-D)  07/11/2015  BIV ICD GENERATOR CHANGEOUT N/A 03/15/2020  Procedure: BIV ICD GENERATOR CHANGEOUT;  Surgeon: Marinus Maw, MD;  Location: [facility] INVASIVE CV LAB;  Service: Cardiovascular;  Laterality: N/A;  CARDIAC CATHETERIZATION    EP IMPLANTABLE DEVICE N/A 07/11/2015  Procedure: BiV ICD Insertion CRT-D;  Surgeon: Marinus Maw, MD;  Location: [facility] INVASIVE CV LAB;  Service: Cardiovascular;  Laterality: N/A;  LEFT HEART CATHETERIZATION WITH CORONARY ANGIOGRAM N/A 10/29/2014  Procedure: LEFT HEART CATHETERIZATION WITH CORONARY ANGIOGRAM;  Surgeon: Lesleigh Noe, MD;  Location[facility] CATH LAB;  Service: Cardiovascular;  Laterality: N/A;  TONSILLECTOMY    TUBAL LIGATION  1970's Nicole Chan 08/27/2023, 3:59 PM  VAS [place] UPPER EXTREMITY ARTERIAL DUPLEX  Result Date: 08/26/2023  UPPER EXTREMITY DUPLEX STUDY Patient Name:  KOLIE RISSMAN  Date of Exam:   08/26/2023 Medical Rec #: [id number]         Accession #:    [id number] Date of Birth: 1941/03/23  Patient Gender: F Patient Age:   45 years Exam Location:  [facility] Procedure:      VAS [place] UPPER EXTREMITY ARTERIAL DUPLEX Referring Phys: Scheryl Marten XU --------------------------------------------------------------------------------  Indications: Abnormal CT finding (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 History:     Patient has a history of Currently admitted for CVA.  Risk Factors:  Hypertension, hyperlipidemia,  current smoker. Other Factors: CHF, ICD, PAD Comparison Study: No previous exams Performing Technologist: Jody Hill RVT, RDMS  Examination Guidelines: A complete evaluation includes B-mode imaging, spectral Doppler, color Doppler, and power Doppler as needed of all accessible portions of each vessel. Bilateral testing is considered an integral part of a complete examination. Limited examinations for reoccurring indications may be performed as noted.  Right Doppler Findings: +---------------+----------+----------+-------------+--------+ Site           PSV (cm/s)Waveform  Stenosis     Comments +---------------+----------+----------+-------------+--------+ Subclavian [id number]       monophasic                      +---------------+----------+----------+-------------+--------+ Subclavian Mid 296       monophasic>50% stenosis         +---------------+----------+----------+-------------+--------+ Subclavian Dist310       monophasic>50% stenosis         +---------------+----------+----------+-------------+--------+ Axillary       532       monophasic>50% stenosis         +---------------+----------+----------+-------------+--------+ Brachial Prox  116       monophasic                      +---------------+----------+----------+-------------+--------+ Brachial Mid   99        monophasic                      +---------------+----------+----------+-------------+--------+ Brachial Dist  102       monophasic                      +---------------+----------+----------+-------------+--------+ Radial Mid     72        monophasic                      +---------------+----------+----------+-------------+--------+ Radial Dist    58        monophasic                      +---------------+----------+----------+-------------+--------+ Ulnar Mid      37        monophasic>50% stenosis         +---------------+----------+----------+-------------+--------+ Ulnar Dist     35         monophasic>50% stenosis         +---------------+----------+----------+-------------+--------+ Palmar Arch    28        monophasic                      +---------------+----------+----------+-------------+--------+    Summary:  Right: Post stenotic waveform in proximal subclavian may indicate a        more proximal occlusion. 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 *See table(s) above for measurements and observations. Suggest Peripheral Vascular Consult. Electronically signed by Sherald Hess MD on 08/26/2023 at 1:46:39 PM.    Final    DG Abd 2 Views  Result Date: 08/25/2023 CLINICAL DATA:  [id number] Vomiting [id number] EXAM:  ABDOMEN - 2 VIEW COMPARISON:  None Available. FINDINGS: The bowel gas pattern is normal. There is no evidence of free air. No radio-opaque calculi or other significant radiographic abnormality is seen. Excreted contrast within the urinary bladder. IMPRESSION: Negative. Electronically Signed   By: Duanne Guess D.O.   On: 08/25/2023 21:45   DG Pelvis 1-2 Views  Result Date: 08/25/2023 CLINICAL DATA:  Pain EXAM: PELVIS - 1-2 VIEW COMPARISON:  None Available. FINDINGS: There is no evidence of pelvic fracture or diastasis. Hip joints are intact. Excreted contrast within the urinary bladder. IMPRESSION: Negative. Electronically Signed   By: Duanne Guess D.O.   On: 08/25/2023 21:44   ECHOCARDIOGRAM COMPLETE  Result Date: 08/25/2023    ECHOCARDIOGRAM REPORT   Patient Name:   Nicole Chan Date of Exam: 08/25/2023 Medical Rec #:  [id number]        Height:       67.0 in Accession #:    [id number]       Weight:       197.5 lb Date of Birth:  11-15-1940       BSA:          2.012 m Patient Age:    82 years         BP:           181/92 mmHg Patient Gender: F                HR:           88 bpm. Exam Location:  Inpatient Procedure: 2D Echo, Cardiac Doppler, Color Doppler and Saline Contrast Bubble             Study Indications:    Stroke  History:        Patient has prior history of Echocardiogram examinations, most                 recent 06/20/2015. CHF, Abnormal ECG and Defibrillator,                 Arrythmias:LBBB, Signs/Symptoms:Dizziness/Lightheadedness; Risk                 Factors:Hypertension, Current Smoker and Dyslipidemia.  Sonographer:    Sheralyn Boatman RDCS Referring Phys: [id number] [facility] GOEL IMPRESSIONS  1. There is apical LV cavity systolic obliteration with a dynamic "gradient" of up to 2 m/s. There is no evidence of LV outflow obstruction and there is no systolic anterior motion of the mitral valve at rest or with the Valsalva maneuver. Left ventricular ejection fraction, by estimation, is 45 to 50%. The left ventricle has mildly decreased function. The left ventricle has no regional wall motion abnormalities. There is moderate asymmetric left ventricular hypertrophy of the basal-septal and apical segments. Indeterminate diastolic filling due to E-A fusion.  2. Right ventricular systolic function is normal. The right ventricular size is normal.  3. The mitral valve is degenerative. No evidence of mitral valve regurgitation. No evidence of mitral stenosis. Moderate mitral annular calcification.  4. The aortic valve is tricuspid. There is mild calcification of the aortic valve. There is moderate thickening of the aortic valve. Aortic valve regurgitation is trivial. Aortic valve sclerosis/calcification is present, without any evidence of aortic stenosis. Comparison(s): Prior images unable to be directly viewed, comparison made by report only. The left ventricular function has improved. FINDINGS  Left Ventricle: There is apical LV cavity systolic obliteration with a dynamic "gradient" of up to 2 m/s. There is no evidence of LV  outflow obstruction and there is no systolic anterior motion of the mitral valve at rest or with the Valsalva maneuver. Left ventricular ejection fraction, by estimation, is 45 to 50%. The  left ventricle has mildly decreased function. The left ventricle has no regional wall motion abnormalities. The left ventricular internal cavity size was small. There is moderate asymmetric left ventricular hypertrophy of the basal-septal and apical segments. Abnormal (paradoxical) septal motion, consistent with left bundle branch block. Indeterminate diastolic filling due to E-A fusion. Right Ventricle: The right ventricular size is normal. No increase in right ventricular wall thickness. Right ventricular systolic function is normal. Left Atrium: Left atrial size was normal in size. Right Atrium: Right atrial size was normal in size. Pericardium: There is no evidence of pericardial effusion. Mitral Valve: The mitral valve is degenerative in appearance. Moderate mitral annular calcification. No evidence of mitral valve regurgitation. No evidence of mitral valve stenosis. Tricuspid Valve: The tricuspid valve is grossly normal. Tricuspid valve regurgitation is not demonstrated. Aortic Valve: The aortic valve is tricuspid. There is mild calcification of the aortic valve. There is moderate thickening of the aortic valve. Aortic valve regurgitation is trivial. Aortic valve sclerosis/calcification is present, without any evidence of aortic stenosis. Pulmonic Valve: The pulmonic valve was not well visualized. Pulmonic valve regurgitation is not visualized. Aorta: The aortic root is normal in size and structure. IAS/Shunts: No atrial level shunt detected by color flow Doppler. Agitated saline contrast was given intravenously to evaluate for intracardiac shunting. Additional Comments: A device lead is visualized in the right ventricle.  LEFT VENTRICLE PLAX 2D LVIDd:         2.60 cm     Diastology LVIDs:         2.00 cm     LV e' medial:    3.70 cm/s LV PW:         1.75 cm     LV E/e' medial:  30.8 LV IVS:        2.15 cm     LV e' lateral:   4.35 cm/s LVOT diam:     1.90 cm     LV E/e' lateral: 26.2 LV SV:         56 LV SV  Index:   28 LVOT Area:     2.84 cm  LV Volumes (MOD) LV vol d, MOD A2C: 62.1 ml LV vol d, MOD A4C: 61.9 ml LV vol s, MOD A2C: 30.0 ml LV vol s, MOD A4C: 27.9 ml LV SV MOD A2C:     32.1 ml LV SV MOD A4C:     61.9 ml LV SV MOD BP:      39.3 ml RIGHT VENTRICLE             IVC RV S prime:     11.40 cm/s  IVC diam: 1.30 cm TAPSE (M-mode): 1.6 cm LEFT ATRIUM           Index        RIGHT ATRIUM          Index LA diam:      1.10 cm 0.55 cm/m   RA Area:     8.61 cm LA Vol (A2C): 17.4 ml 8.65 ml/m   RA Volume:   15.70 ml 7.80 ml/m LA Vol (A4C): 30.2 ml 15.01 ml/m  AORTIC VALVE LVOT Vmax:   116.00 cm/s LVOT Vmean:  77.700 cm/s LVOT VTI:    0.196 m  AORTA Ao Root diam: 3.30 cm MITRAL VALVE MV  Area (PHT): 6.27 cm     SHUNTS MV Decel Time: 121 msec     Systemic VTI:  0.20 m MV E velocity: 114.00 cm/s  Systemic Diam: 1.90 cm Rachelle Hora Croitoru MD Electronically signed by Thurmon Fair MD Signature Date/Time: 08/25/2023/5:20:55 PM    Final    MR BRAIN WO CONTRAST  Result Date: 08/25/2023 CLINICAL DATA:  Provided history: Stroke, follow-up. EXAM: MRI HEAD WITHOUT CONTRAST TECHNIQUE: Multiplanar, multiecho pulse sequences of the brain and surrounding structures were obtained without intravenous contrast. COMPARISON:  Non-contrast head CT and CT angiogram head/neck 08/25/2023. FINDINGS: Brain: No age advanced or lobar predominant parenchymal atrophy. Mild cerebellar atrophy. 1.5 x 2.3 x 2.9 cm acute infarct within the right corona radiata/basal ganglia. Small acute infarcts within the right insula and right subinsular white matter. Chronic lacunar infarcts within bilateral cerebral hemispheric white matter and basal ganglia. Small focus of diffusion-weighted signal hyperintensity at site of a chronic infarct in the left basal ganglia, likely reflecting susceptibility artifact from chronic blood products at this site. Background advanced patchy and confluent T2 FLAIR hyperintense signal abnormality within the cerebral white  matter, nonspecific but compatible with chronic small vessel ischemic disease. Small chronic infarcts within the left cerebellar hemisphere. 9 mm extra-axial mass along the right aspect of the anterior falx, likely reflecting a meningioma (for instance as seen on series 11, image 41) (series 15, image 22). Contact upon the underlying right lobe without underlying parenchymal edema. There are a few chronic microhemorrhages scattered within the supratentorial brain. Partially empty sella turcica. No extra-axial fluid collection. No midline shift. Vascular: Maintained flow voids within the proximal large arterial vessels. 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 Skull and upper cervical spine: Indeterminate well-circumscribed 10 mm FLAIR hyperintense and T1 hypointense lesion within the left frontal calvarium (for instance as seen on series 11, image 44). Sinuses/Orbits: No mass or acute finding within the imaged orbits. Minimal mucosal thickening within the right ethmoid and left maxillary sinuses. IMPRESSION: 1. 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 2. Small acute infarcts within the right insula and right subinsular white matter. 3. Background advanced chronic small ischemic disease with multiple chronic infarcts, as described. 4. 9 mm extra-axial mass along the right aspect of the anterior falx, likely reflecting a meningioma. Contact upon the underlying right frontal lobe without underlying parenchymal edema 5. Mild cerebellar atrophy. 6. Indeterminate 10 mm osseous lesion within the left frontal calvarium. Consider a follow-up brain MRI in 3 months to ensure stability. Electronically Signed   By: Jackey Loge D.O.   On: 08/25/2023 13:59   DG Chest Port 1 View  Result Date: 08/25/2023 CLINICAL DATA:  Weakness status post fall EXAM: PORTABLE CHEST 1 VIEW COMPARISON:  Chest radiograph dated 04/03/2021  FINDINGS: Lines/tubes: Left chest wall ICD leads project over the right atrium and ventricle and tributary of the coronary sinus. Lungs: Low lung volumes with bronchovascular crowding. No focal consolidation. Pleura: No pneumothorax or pleural effusion. Heart/mediastinum: The heart size and mediastinal contours are within normal limits. Bones: No radiographic finding of acute displaced fracture. IMPRESSION: 1. Low lung volumes with bronchovascular crowding. No focal consolidation. 2.  No radiographic finding of acute displaced fracture. Electronically Signed   By: Agustin Cree M.D.   On: 08/25/2023 10:29   CT ANGIO HEAD NECK W WO CM W PERF (CODE STROKE)  Result Date: 08/25/2023 CLINICAL DATA:  Neuro deficit, acute, stroke suspected  EXAM: CT ANGIOGRAPHY HEAD AND NECK CT PERFUSION BRAIN TECHNIQUE: Multidetector CT imaging of the head and neck was performed using the standard protocol during bolus administration of intravenous contrast. Multiplanar CT image reconstructions and MIPs were obtained to evaluate the vascular anatomy. Carotid stenosis measurements (when applicable) are obtained utilizing NASCET criteria, using the distal internal carotid diameter as the denominator. Multiphase CT imaging of the brain was performed following IV bolus contrast injection. Subsequent parametric perfusion maps were calculated using RAPID software. RADIATION DOSE REDUCTION: This exam was performed according to the departmental dose-optimization program which includes automated exposure control, adjustment of the mA and/or kV according to patient size and/or use of iterative reconstruction technique. CONTRAST:  OMNIPAQUE IOHEXOL 350 MG/ML SOLN COMPARISON:  Same day CT head. FINDINGS: CTA NECK FINDINGS Aortic arch: Moderate stenosis of the right subclavian artery origin. Intraluminal filling defect within the proximal subclavian artery which is suspicious for thrombus. Ulcerated atherosclerosis of the left subclavian artery  origin with mild narrowing. Right carotid system: Atherosclerosis at the carotid bifurcation with approximately 30% stenosis of the ICA origin. Left carotid system: Atherosclerosis at the carotid bifurcation involving the proximal ICA with proximally 50% stenosis of the proximal ICA. Vertebral arteries: Severe bilateral vertebral artery origin stenosis. Both vertebral arteries are patent. Left dominant. Skeleton: No acute abnormality on limited assessment. Multilevel degenerative change in the cervical spine. Other neck: Subcentimeter thyroid nodules which do not require imaging follow-up (ref: J Am Coll Radiol. 2015 Feb;12(2): 143-50). Upper chest: Visualized lung apices are clear. Review of the MIP images confirms the above findings CTA HEAD FINDINGS Anterior circulation: Bilateral intracranial ICAs are patent with mild to moderate narrowing bilaterally. 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 Posterior circulation: Bilateral intradural vertebral arteries, basilar artery and bilateral posterior cerebral arteries are are patent. Moderate right proximal P2 PCA stenosis. Venous sinuses: As permitted by contrast timing, patent. Review of the MIP images confirms the above findings CT Brain Perfusion Findings: ASPECTS: 10. CBF (<30%) Volume: 0mL Perfusion (Tmax>6.0s) volume: 0mL Mismatch Volume: 0mL Infarction Location:None identified. IMPRESSION: CTA head: 1. No emergent large vessel occlusion. 2. Moderate right proximal P2 PCA stenosis. 3. 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 CTA neck: 1. Approximately 50% stenosis of the proximal ICAs bilaterally in the neck. 2. Severe bilateral vertebral artery origin stenosis. 3. Moderate atherosclerotic narrowing of the right subclavian artery origin. Intraluminal filling defect within the subclavian artery in this region is suspicious for nonocclusive thrombus versus atherosclerotic plaque  extending into the lumen. Right upper extremity ultrasound may be useful to further evaluate and also exclude more distal clot in the arm. 4. Aortic Atherosclerosis (ICD10-I70.0). CT perfusion: No evidence of core infarct or penumbra. Electronically Signed   By: Feliberto Harts M.D.   On: 08/25/2023 09:39   CT HEAD CODE STROKE WO CONTRAST  Result Date: 08/25/2023 CLINICAL DATA:  Code stroke. Neuro deficit with acute stroke suspected facial droop with left arm and leg weakness. EXAM: CT HEAD WITHOUT CONTRAST TECHNIQUE: Contiguous axial images were obtained from the base of the skull through the vertex without intravenous contrast. RADIATION DOSE REDUCTION: This exam was performed according to the departmental dose-optimization program which includes automated exposure control, adjustment of the mA and/or kV according to patient size and/or use of iterative reconstruction technique. COMPARISON:  None Available. FINDINGS: Brain: No evidence of acute infarction, hemorrhage, hydrocephalus, extra-axial collection or mass lesion/mass effect. Extensive chronic small vessel  ischemia with gliosis and lacunar infarcts in the deep gray nuclei. None of these are clearly acute. Small chronic infarct in the left cerebellum. Vascular: Limited at the proximal MCA due to streak artifact. No convincing hyperdensity. Skull: No acute or aggressive finding. Lucency at the superior left frontal bone is likely a incidental hemangioma. Sinuses/Orbits: No acute finding. Other: These results were communicated to Dr. Wilford Corner at 8:44 am on 08/25/2023 by epic chat. ASPECTS [facility] An Stroke Program Early CT Score) - Ganglionic level infarction (caudate, lentiform nuclei, internal capsule, insula, M1-M3 cortex): 7 - Supraganglionic infarction (M4-M6 cortex): 3 Total score (0-10 with 10 being normal): 10 IMPRESSION: 1. No acute finding. 2. Extensive chronic small vessel ischemia. Electronically Signed   By: Tiburcio Pea M.D.   On: 08/25/2023  08:46     PHYSICAL EXAM  Temp:  [97.5 F (36.4 C)-98.5 F (36.9 C)] 98.5 F (36.9 C) (12/04 0753) Pulse Rate:  [55-101] 76 (12/04 1230) Resp:  [26-35] 30 (12/04 1134) BP: (97-161)/(41-84) 104/54 (12/04 1134) SpO2:  [32 %-96 %] 32 % (12/04 1230) FiO2 (%):  [100 %] 100 % (12/04 1053)  General - Well nourished, well developed, drowsy and barely arousable.  In respiratory distress on nonrebreather mask  Ophthalmologic - fundi not visualized due to noncooperation.  Cardiovascular - Regular rhythm and rate.  Neuro -RJ sleepy and needed constant stimulation to barely open her eyes.  She is not speaking but does follow simple one-step commands in midline and on the right arm and leg.. No gaze palsy, tracking bilaterally, visual field full. Left facial droop.  Dense left hemiplegia but will withdraw slightly to painful stimulus.  Purposeful movements on the right.. gait not tested.     ASSESSMENT/PLAN Ms. IASIA CARKHUFF is a 83 y.o. female with history of cardiomyopathy and CHF with pacemaker, hypertension, smoker, PAD admitted for found down at home with left-sided weakness, left facial droop and slurred speech. No tPA given due to outside window.    Stroke:  right BG/CR and 2 small insular cortex infarcts, concerning secondary to large vessel disease from moderate right ICA stenosis..  Recent subacute decline in mental status of unclear etiology.  Possibilities include interval new strokes not seen on CT scan versus silent seizures.  No clear metabolic derangement to explain worsening CT no acute abnormality CT head and neck bilateral ICA proximal 50% stenosis, right P2 severe stenosis, bilateral VA origin stenosis, right subclavian artery proximal ulcerated plaque MRI right BG/CRinfarct and 2 small insular and subinsular infarcts. 2D Echo EF 40 to 45%, LV cavity systolic obliteration Pacemaker interrogation no afib EEG 12/3 This study is suggestive of moderate diffuse encephalopathy. No  seizures or epileptiform discharges were seen throughout the recording.  LDL 141 HgbA1c 5.4 UDS negative Lovenox for VTE prophylaxis No antithrombotic prior to admission, now on aspirin 81 mg daily and clopidogrel 75 mg daily DAPT. Ongoing aggressive stroke risk factor management Therapy recommendations: CIR Disposition: Pending  Possible symptomatic right ICA stenosis CT head and neck bilateral ICA proximal 50% stenosis Carotid Doppler bilateral ICA 40 to 59% stenosis Vascular surgery consulted Dr. Chestine Spore is on board, offered right CEA and family refused Family will discuss with pt PCP  Cardiomyopathy Status post pacemaker Pacemaker interrogation pending EF 40 to 45% On Coreg and ARB PTA  Hypertension Stable on the high end gradually reach BP goal within 2-3 days. BP goal 130-160 before carotid revascularization Long term BP goal normotensive  Hyperlipidemia Home meds: Lipitor 20 LDL 141, goal <  70 Now on atorvastatin 80 Continue statin at discharge  Tobacco abuse Current smoker Smoking cessation counseling provided Pt is willing to quit  Other Stroke Risk Factors Advanced age Obesity, Body mass index is 32.84 kg/m.  PAD - R axillary artery chronic stenosis on aterial Doppler, right subclavian artery proximal ulcerated plaque  Other Active Problems Leukocytosis WBC 13.9--12.5-13.7 AKI BUN 129, creatinine 2.37  Hospital day # 7   Gevena Mart DNP, ACNPC-AG  [facility]  I have personally obtained history,examined this patient, reviewed notes, independently viewed imaging studies, participated in medical decision making and plan of care.ROS completed by me personally and pertinent positives fully documented  I have made any additions or clarifications directly to the above note. Agree with note above.  EEG shows mild diffuse slowing and no seizure activity.  Patient recent neurological decline is likely secondary to hypoxia and respiratory distress due  to aspiration pneumonia.  Prognosis is very poor given his stroke and poor general medical condition.  Family is meeting with primary team to discuss goals of care and hopefully transition to comfort care soon.  Stroke team will sign off.  Kindly call for questions Delia Heady, MD Medical Director [facility] Pager: [phone] 09/22/2023 2:11 PM    To contact Stroke Continuity provider, please refer to [email or web address]. After hours, contact General Neurology

## 2023-09-29 NOTE — Progress Notes (Signed)
Morphine was wasted with Cecil Cobbs RN as witness.

## 2023-09-29 NOTE — Death Summary Note (Signed)
DEATH SUMMARY   Patient Details  Name: AMORI BARTEL MRN: [id number] DOB: 1940-10-06 QIO:[id number], Cala Bradford, MD Admission/Discharge Information   Admit Date:  Sep 09, 2023  Date of Death: Date of Death: 16-Sep-2023  Time of Death: Time of Death: 1248  Length of Stay: 7   Principle Cause of death: Acute respiratory Failure.   Hospital Diagnoses: Principal Problem:   Stroke (cerebrum) (HCC) Active Problems:   Essential hypertension   Tobacco use   Leukocytosis   Hypercalcemia   Brain mass   Subclavian artery thrombosis (HCC)   AKI (acute kidney injury) (HCC)   Meningioma (HCC)   Protein-calorie malnutrition, severe    Brief Narrative:  83 y.o. female past medical history significant for essential hypertension, was of the history was obtained from the chart and son at bedside.  He was found on the floor at bedside at 7:30 AM on the day of admission, last time known to be normal was 10 PM the night before.  She smelled like urine was having dysarthria and right arm weakness with poor mentation.  CTA of the head showed no acute findings, extensive chronic small vessel disease. MRI of the brain confirmed acute CVA. Transthoracic Echo showed an EF of 50%, no wall motion abnormality.  Over the last 24 hours she has had mental status decline therefore PCCM consulted.  There is also concerns of dysphagia therefore started on Unasyn to cover for aspiration.  Unfortunately eventually patient went into multi organ failure including respiratory failure, acute kidney injury, uremia.  Also noted to have urinary tract infection.  Antibiotics were broadened to Zosyn.  Despite of multiple left.  Patient continued to decline.  Had extensive meeting with patient's family members, and palliative care team.  Eventually Ms. Santillanes was transitioned to comfort care and soon after passed away. Family updated at bedside throughout the day.   Assessment & Plan:  Principal Problem:   Stroke (cerebrum)  (HCC) Active Problems:   Essential hypertension   Tobacco use   Leukocytosis   Hypercalcemia   Brain mass   Subclavian artery thrombosis (HCC)   AKI (acute kidney injury) (HCC)   Meningioma (HCC)   Protein-calorie malnutrition, severe   Acute CVA to the right corona radiata and 2 small insular cortex infarct Subclavian artery thrombosis Now with left-sided weakness.  A1c 5.6, LDL 141 on statin.  CTA head and neck showed bilateral 50% ICA stenosis.  Vascular planning CEA but patient declined.  Echo shows EF 40-45%. -Neurology recommended DAPT  Acute respiratory distress - Combination of fluid, aspiration.  She remains on 15 L high flow nasal cannula eventually had to be transition to nonrebreather.  Her respiratory status continues to decline.  She was eventually transitioned to comfort care on morphine drip.  Acute change in mental status Possible urinary tract infection Elevated ammonia - Evolving prior infarct noted on CT 12/2 which was initially seen on MRI on Sep 09, 2023 - Patient antibiotics were broadened to Zosyn, lactulose was added.  Neurology was reconsulted.  EEG unremarkable which showed generalized slowing.  Unable to get MRI given her respiratory condition   Dysphagia 2/2 stroke, due to worsening respiratory status, her tube feeds had to be paused earlier today.  Acute kidney injury/uremia -Baseline creatinine 0.8 has been rising over the last 3 days.  Renal function continues to decline, today it is up to 2.37 with uremia.    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  No underlying edema.  Will need a 37-month follow-up. SPEP and UPEP pending, this is low yield in the setting of normal renal function and unremarkable hemoglobin  Vitamin D deficiency - Supplements   Essential hypertension: Due to low blood pressure Home medications on hold.  IV as needed.   Mild hypercalcemia: Resolved  with IV fluids.  Likely due to dehydration.   Leukocytosis: Likely reactive has remained afebrile, now resolved. Tmax 99.3.   Tobacco abuse: She has been counseled family has been informed that she needs to quit smoking.   After extensive discussion with the patient's family members and palliative care team.  Patient was transition to comfort care.  Eventually she was deceased. Support service for family was provided.   Subjective: Ms. Cockrell continued decline throughout last and this morning with worsening respiratory status.  After extensive family discussion and multiple efforts to help it was eventually considered futile and she was transitioned to comfort care.  Family was cleared keep patient DNR/DNI.   Examination:  General exam: Lethargic Respiratory system: Diffuse rhonchi with tachypnea and use of accessory muscles Cardiovascular system: S1 & S2 heard, RRR. No JVD, murmurs, rubs, gallops or clicks. No pedal edema. Gastrointestinal system: Abdomen is nondistended, soft Central nervous system: Difficult to fully assess Extremities: To assess as no meaningful movement. Skin: No rashes, lesions or ulcers Psychiatry: Difficult to assess Foley catheter in place Cortrak in place    The results of significant diagnostics from this hospitalization (including imaging, microbiology, ancillary and laboratory) are listed below for reference.   Significant Diagnostic Studies: DG Chest Port 1 View  Result Date: 08/31/2023 CLINICAL DATA:  Dyspnea. EXAM: PORTABLE CHEST 1 VIEW COMPARISON:  Radiographs 08/30/2023 and 08/25/2023. FINDINGS: 0756 hours. Patient remains rotated to the right. Left subclavian AICD leads and feeding tube are unchanged in position. The heart size and mediastinal contours are stable with aortic atherosclerosis. There is increased opacity medially at the right lung base, partially obscuring medial aspect of the right hemidiaphragm, consistent with right lower lobe  collapse or new infiltrate. The left lung appears stable with mild basilar atelectasis. No pneumothorax or significant pleural effusion. The bones appear unchanged. IMPRESSION: Increased opacity medially at the right lung base consistent with right lower lobe collapse or new infiltrate. Radiographic follow up recommended. Electronically Signed   By: Carey Bullocks M.D.   On: 09/05/2023 08:03   EEG adult  Result Date: 08/31/2023 Charlsie Quest, MD     08/31/2023  7:30 PM Patient Name: JAILYNNE KUO MRN: [id number] Epilepsy Attending: Charlsie Quest Referring Physician/Provider: Miguel Rota, MD Date: 08/31/2023 Duration: 22.11 mins Patient history: 83 yo F with right BG/CR and 2 small insular cortex infarcts. EEG to evaluate for seizure Level of alertness: Asleep/ lethargic AEDs during EEG study: None Technical aspects: This EEG study was done with scalp electrodes positioned according to the 10-20 International system of electrode placement. Electrical activity was reviewed with band pass filter of 1-70Hz , sensitivity of 7 uV/mm, display speed of 21mm/sec with a 60Hz  notched filter applied as appropriate. EEG data were recorded continuously and digitally stored.  Video monitoring was available and reviewed as appropriate. Description: EEG showed continuous generalized predominantly 5 to 6 Hz theta slowing admixed with intermittent 2-3Hz  delta slowing. Hyperventilation and photic stimulation were not performed.   ABNORMALITY - Continuous slow, generalized IMPRESSION: This study is suggestive of moderate diffuse encephalopathy. No seizures or epileptiform discharges were seen throughout the recording. Priyanka Annabelle Harman   DG  CHEST PORT 1 VIEW  Result Date: 08/30/2023 CLINICAL DATA:  Hypoxia and shortness of breath. EXAM: PORTABLE CHEST 1 VIEW COMPARISON:  Chest radiograph dated 08/25/2023. FINDINGS: Feeding tube with tip in the right hemiabdomen, likely in the distal stomach. Shallow inspiration with  bibasilar atelectasis. No focal consolidation, pleural effusion, pneumothorax. Stable mild cardiomegaly. Left pectoral AICD device. No acute osseous pathology. IMPRESSION: Shallow inspiration with bibasilar atelectasis. Electronically Signed   By: Elgie Collard M.D.   On: 08/30/2023 17:51   CT HEAD WO CONTRAST ( )  Result Date: 08/30/2023 CLINICAL DATA:  Recent stroke, stroke suspected EXAM: CT HEAD WITHOUT CONTRAST TECHNIQUE: Contiguous axial images were obtained from the base of the skull through the vertex without intravenous contrast. RADIATION DOSE REDUCTION: This exam was performed according to the departmental dose-optimization program which includes automated exposure control, adjustment of the mA and/or kV according to patient size and/or use of iterative reconstruction technique. COMPARISON:  08/25/2023 CT head and MRI head FINDINGS: Brain: Increasing hypodensity in the right corona radiata and basal ganglia, consistent with expected evolution of the infarcts noted on the 08/25/2023 MRI. No evidence of hemorrhagic transformation. No new area of hypodensity to suggest additional acute infarct. No evidence of mass, mass effect, or midline shift. No hydrocephalus or extra-axial fluid collection. Periventricular white matter changes, likely the sequela of chronic small vessel ischemic disease. Remote infarcts in the left cerebellum and left basal ganglia. Vascular: No hyperdense vessel. Atherosclerotic calcifications in the intracranial carotid and vertebral arteries. Skull: Negative for fracture or focal lesion. Sinuses/Orbits: No acute finding. Other: The mastoid air cells are well aerated.  Nasogastric tube. IMPRESSION: Increasing hypodensity in the right corona radiata and basal ganglia, consistent with expected evolution of the acute infarcts noted on the 08/25/2023 MRI. No evidence of hemorrhagic transformation. No new area of hypodensity to suggest additional acute infarct. Electronically  Signed   By: Wiliam Ke M.D.   On: 08/30/2023 16:00   DG Abd Portable 1V  Result Date: 08/30/2023 CLINICAL DATA:  Feeding tube placement. EXAM: PORTABLE ABDOMEN - 1 VIEW COMPARISON:  Abdominal radiographs 08/25/2023. FINDINGS: 1200 hours. Single portable examination of the abdomen demonstrates the tip of a feeding tube projecting over the right upper quadrant of the abdomen, likely in the distal stomach or proximal duodenum. The visualized bowel gas pattern is normal. Cardiac AICD leads are partially imaged. IMPRESSION: Feeding tube tip projects over the distal stomach or proximal duodenum. Electronically Signed   By: Carey Bullocks M.D.   On: 08/30/2023 14:22   VAS US CAROTID  Result Date: 08/28/2023 Carotid Arterial Duplex Study Patient Name:  ONG BAES  Date of Exam:   08/26/2023 Medical Rec #: [id number]         Accession #:    [id number] Date of Birth: [birth year]        Patient Gender: F Patient Age:   63 years Exam Location:  [facility] Procedure:      VAS US CAROTID Referring Phys: [facility] --------------------------------------------------------------------------------  Indications:   CVA. Risk Factors:  Hypertension, hyperlipidemia, current smoker. Other Factors: CHF, ICD, PAD. Performing Technologist: Ernestene Mention RVT, RDMS  Examination Guidelines: A complete evaluation includes B-mode imaging, spectral Doppler, color Doppler, and power Doppler as needed of all accessible portions of each vessel. Bilateral testing is considered an integral part of a complete examination. Limited examinations for reoccurring indications may be performed as noted.  Right Carotid Findings: +----------+--------+--------+--------+---------------------+------------------+  PSV cm/sEDV cm/sStenosisPlaque Description   Comments           +----------+--------+--------+--------+---------------------+------------------+ CCA Prox  71      11              heterogenous                             +----------+--------+--------+--------+---------------------+------------------+ CCA Distal63      12              heterogenous                            +----------+--------+--------+--------+---------------------+------------------+ ICA Prox  142     24      40-59%  diffuse, irregular   diffuse calcific                                     and calcific         plaque and                                                                shadowing          +----------+--------+--------+--------+---------------------+------------------+ ICA Mid   70      14                                                      +----------+--------+--------+--------+---------------------+------------------+ ICA Distal75      18                                                      +----------+--------+--------+--------+---------------------+------------------+ ECA       241     21      >50%    heterogenous and                                                          calcific                                +----------+--------+--------+--------+---------------------+------------------+ +----------+--------+-------+--------+-------------------+           PSV cm/sEDV cmsDescribeArm Pressure (mmHG) +----------+--------+-------+--------+-------------------+ [id number]            Stenotic                    +----------+--------+-------+--------+-------------------+ +---------+--------+--+--------+-+ VertebralPSV cm/s35EDV cm/s7 +---------+--------+--+--------+-+  Left Carotid Findings: +----------+--------+--------+--------+---------------------+------------------+           PSV cm/sEDV cm/sStenosisPlaque Description   Comments           +----------+--------+--------+--------+---------------------+------------------+ CCA Prox  86  16              heterogenous         intimal thickening  +----------+--------+--------+--------+---------------------+------------------+ CCA Distal63      12              heterogenous                            +----------+--------+--------+--------+---------------------+------------------+ ICA Prox  136     26              diffuse, irregular,                                                       calcific and                                                              heterogenous                            +----------+--------+--------+--------+---------------------+------------------+ ICA Mid   122     26                                                      +----------+--------+--------+--------+---------------------+------------------+ ICA Distal94      14                                                      +----------+--------+--------+--------+---------------------+------------------+ ECA       109     0               heterogenous                            +----------+--------+--------+--------+---------------------+------------------+ +----------+--------+--------+--------+-------------------+           PSV cm/sEDV cm/sDescribeArm Pressure (mmHG) +----------+--------+--------+--------+-------------------+ [id number]             Stenotic                    +----------+--------+--------+--------+-------------------+ +---------+--------+--+--------+-+ VertebralPSV cm/s55EDV cm/s8 +---------+--------+--+--------+-+   Summary: Right Carotid: Velocities in the right ICA are consistent with a 40-59%                stenosis. The ECA appears >50% stenosed. Left Carotid: Velocities in the left ICA are consistent with a 40-59% stenosis. Vertebrals:  Bilateral vertebral arteries demonstrate antegrade flow. Subclavians: Bilateral subclavian arteries were stenotic. *See table(s) above for measurements and observations.  Electronically signed by Delia Heady MD on 08/28/2023 at 12:52:56 PM.    Final    DG  Swallowing Func-Speech Pathology  Result Date: 08/27/2023 Table formatting from the original result was not included. Modified Barium Swallow Study Patient Details Name: Shawnette  TABITHIA OGUINN MRN: [id number] Date of Birth: 01-10-1941 Today's Date: 08/27/2023 HPI/PMH: HPI: Sumaiya Stride is an 83 yr old female who presented to [facility] via EMS on 08/25/2023. She was found down by family. EMS activated code stroke alert for left sided weakness and questionable rt gaze preference. 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 Small acute infarcts within the right insula and right subinsular  white matter.   PMH of CHF, Cardiomyopathy, HTN, pacemaker. Clinical Impression: Clinical Impression: Pt presents with a moderate sensorimotor oropharyngeal dysphagia. There was impaired oral control with residue on tongue and left lateral sulcus. Pharyngeal swallow onset occurred at the level of the pyriforms, with spillage into the larynx and trace, silent aspiration of thin liquids.  Nectar thick liquids reached the level of the vocal folds without eliciting a cough response. Laryngeal vestibule closure was adequate but often occurred too late to be fully protective. There was reduced pharyngeal squeeze and base of tongue contact, leading to mild residue primarily in the valleculae, with notable retrograde flow of some residue from lower pharynx into mid-pharynx.  There was partial obstruction of flow through PES and occasional trace backflow observed to squeeze upward through PES.  Technical difficulty prevented adequate screen of the esophagus. A head turn to the left was effective in preventing penetration/aspiration with nectar-thick liquids.  Pt has good potential for recovery of swallow - recommend continuing dysphagia 1 for now; allow nectar thick liquids but pt must take single sips and turn head over left should for each swallow. Meds should be crushed.  Her daughter [name] was present for  study and we reviewed results and recs; pt/dtr agree with plan. SLP will follow. Factors that may increase risk of adverse event in presence of aspiration [name] & [name] 2021): No data recorded Recommendations/Plan: Swallowing Evaluation Recommendations Swallowing Evaluation Recommendations Recommendations: PO diet PO Diet Recommendation: Dysphagia 1 (Pureed); Mildly thick liquids (Level 2, nectar thick) Liquid Administration via: Spoon; Cup; Straw Medication Administration: Crushed with puree Supervision: Full assist for feeding Swallowing strategies  : Head turn left during swallowing; Slow rate; Small bites/sips Postural changes: Position pt fully upright for meals Oral care recommendations: Oral care BID (2x/day) Treatment Plan Treatment Plan Treatment recommendations: Therapy as outlined in treatment plan below Follow-up recommendations: Acute inpatient rehab (3 hours/day) Functional status assessment: Patient has had a recent decline in their functional status and demonstrates the ability to make significant improvements in function in a reasonable and predictable amount of time. Treatment frequency: Min 2x/week Treatment duration: 2 weeks Interventions: Aspiration precaution training; Patient/family education; Trials of upgraded texture/liquids; Compensatory techniques; Oropharyngeal exercises Recommendations Recommendations for follow up therapy are one component of a multi-disciplinary discharge planning process, led by the attending physician.  Recommendations may be updated based on patient status, additional functional criteria and insurance authorization. Assessment: Orofacial Exam: Orofacial Exam Oral Cavity: Oral Hygiene: WFL Oral Cavity - Dentition: Edentulous Orofacial Anatomy: WFL Oral Motor/Sensory Function: Suspected cranial nerve impairment CN V - Trigeminal: Left sensory impairment CN VII - Facial: Left motor impairment Anatomy: Anatomy: Suspected cervical osteophytes Boluses Administered:  Boluses Administered Boluses Administered: Thin liquids (Level 0); Mildly thick liquids (Level 2, nectar thick); Moderately thick liquids (Level 3, honey thick); Puree  Oral Impairment Domain: Oral Impairment Domain Lip Closure: Interlabial escape, no progression to anterior lip Tongue control during bolus hold: Cohesive bolus between tongue to palatal seal Bolus preparation/mastication: -- (n/a) Bolus transport/lingual motion: Delayed initiation of tongue motion (oral  holding) Oral residue: Residue collection on oral structures Location of oral residue : Tongue; Floor of mouth Initiation of pharyngeal swallow : Pyriform sinuses  Pharyngeal Impairment Domain: Pharyngeal Impairment Domain Soft palate elevation: No bolus between soft palate (SP)/pharyngeal wall (PW) Laryngeal elevation: Complete superior movement of thyroid cartilage with complete approximation of arytenoids to epiglottic petiole Anterior hyoid excursion: Complete anterior movement Epiglottic movement: Complete inversion Laryngeal vestibule closure: Incomplete, narrow column air/contrast in laryngeal vestibule Pharyngeal stripping wave : Present - diminished Pharyngeal contraction (A/P view only): N/A Pharyngoesophageal segment opening: Partial distention/partial duration, partial obstruction of flow Tongue base retraction: Narrow column of contrast or air between tongue base and PPW Pharyngeal residue: Collection of residue within or on pharyngeal structures Location of pharyngeal residue: Tongue base; Valleculae; Pharyngeal wall; Pyriform sinuses  Esophageal Impairment Domain: Esophageal Impairment Domain Esophageal clearance upright position: -- (tech difficulties viewing esophagus) Pill: Pill Consistency administered: -- (NT) Penetration/Aspiration Scale Score: Penetration/Aspiration Scale Score 1.  Material does not enter airway: Puree; Moderately thick liquids (Level 3, honey thick) 5.  Material enters airway, CONTACTS cords and not ejected out:  Mildly thick liquids (Level 2, nectar thick) 8.  Material enters airway, passes BELOW cords without attempt by patient to eject out (silent aspiration) : Thin liquids (Level 0) Compensatory Strategies: No data recorded  General Information: Caregiver present: Yes  Diet Prior to this Study: Dysphagia 1 (pureed); Thin liquids (Level 0)   Temperature : Normal   Respiratory Status: WFL   Supplemental O2: None (Room air)   History of Recent Intubation: No  Behavior/Cognition: Alert Self-Feeding Abilities: Needs assist with self-feeding Baseline vocal quality/speech: Normal Volitional Cough: Able to elicit Volitional Swallow: Able to elicit Exam Limitations: Limited visibility Goal Planning: Prognosis for improved oropharyngeal function: Good No data recorded No data recorded No data recorded Consulted and agree with results and recommendations: Patient; Family member/caregiver Pain: Pain Assessment Pain Assessment: No/denies pain Faces Pain Scale: 4 Pain Location: generalized Pain Descriptors / Indicators: Guarding; Grimacing Pain Intervention(s): Monitored during session End of Session: Start Time:SLP Start Time (ACUTE ONLY): 1430 Stop Time: SLP Stop Time (ACUTE ONLY): 1500 Time Calculation:SLP Time Calculation (min) (ACUTE ONLY): 30 min Charges: SLP Evaluations $ SLP Speech Visit: 1 Visit SLP Evaluations $MBS Swallow: 1 Procedure $ SLP EVAL LANGUAGE/SOUND PRODUCTION: 1 Procedure $Swallowing Treatment: 1 Procedure SLP visit diagnosis: SLP Visit Diagnosis: Dysphagia, oropharyngeal phase (R13.12) Past Medical History: Past Medical History: Diagnosis Date  Arthritis   "maybe in my hands/fingers; not terrible" (07/12/2015)  Chronic systolic CHF (congestive heart failure) (HCC)   Dilated cardiomyopathy (HCC)   Episode of dizziness, secondary to cardiomyopathy possibly  10/25/2014  Essential hypertension   Hypertensive heart disease   LBBB (left bundle branch block)   a. noted on 10/24/14 admission for dizziness   NICM  (nonischemic cardiomyopathy) (HCC)   a. Cath 10/29/14: normal coronary arteries (tortuous, compatible with hypertension). b. s/p [employer] BiV-ICD 06/2015.  Tobacco abuse  Past Surgical History: Past Surgical History: Procedure Laterality Date  ABDOMINAL HYSTERECTOMY  1970's  APPENDECTOMY    BI-VENTRICULAR IMPLANTABLE CARDIOVERTER DEFIBRILLATOR  (CRT-D)  07/11/2015  BIV ICD GENERATOR CHANGEOUT N/A 03/15/2020  Procedure: BIV ICD GENERATOR CHANGEOUT;  Surgeon: Marinus Maw, MD;  Location: [facility] INVASIVE CV LAB;  Service: Cardiovascular;  Laterality: N/A;  CARDIAC CATHETERIZATION    EP IMPLANTABLE DEVICE N/A 07/11/2015  Procedure: BiV ICD Insertion CRT-D;  Surgeon: Marinus Maw, MD;  Location: [facility] INVASIVE CV LAB;  Service: Cardiovascular;  Laterality: N/A;  LEFT  HEART CATHETERIZATION WITH CORONARY ANGIOGRAM N/A 10/29/2014  Procedure: LEFT HEART CATHETERIZATION WITH CORONARY ANGIOGRAM;  Surgeon: Lesleigh Noe, MD;  Location: [facility] CATH LAB;  Service: Cardiovascular;  Laterality: N/A;  TONSILLECTOMY    TUBAL LIGATION  1970's Carolan Shiver 08/27/2023, 3:59 PM  VAS [place] UPPER EXTREMITY ARTERIAL DUPLEX  Result Date: 08/26/2023  UPPER EXTREMITY DUPLEX STUDY Patient Name:  CARLAS KRIGER  Date of Exam:   08/26/2023 Medical Rec #: [id number]         Accession #:    [id number] Date of Birth: Jan 31, 1941        Patient Gender: F Patient Age:   8 years Exam Location:  [facility] Procedure:      VAS [place] UPPER EXTREMITY ARTERIAL DUPLEX Referring Phys: Scheryl Marten XU --------------------------------------------------------------------------------  Indications: Abnormal CT finding (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 History:     Patient has a history of Currently admitted for CVA.  Risk Factors:  Hypertension, hyperlipidemia, current smoker. Other Factors: CHF, ICD, PAD Comparison Study: No previous exams Performing Technologist: Jody Hill RVT, RDMS  Examination Guidelines: A complete evaluation includes B-mode  imaging, spectral Doppler, color Doppler, and power Doppler as needed of all accessible portions of each vessel. Bilateral testing is considered an integral part of a complete examination. Limited examinations for reoccurring indications may be performed as noted.  Right Doppler Findings: +---------------+----------+----------+-------------+--------+ Site           PSV (cm/s)Waveform  Stenosis     Comments +---------------+----------+----------+-------------+--------+ Subclavian Prox176       monophasic                      +---------------+----------+----------+-------------+--------+ Subclavian Mid 296       monophasic>50% stenosis         +---------------+----------+----------+-------------+--------+ Subclavian Dist310       monophasic>50% stenosis         +---------------+----------+----------+-------------+--------+ Axillary       532       monophasic>50% stenosis         +---------------+----------+----------+-------------+--------+ Brachial Prox  116       monophasic                      +---------------+----------+----------+-------------+--------+ Brachial Mid   99        monophasic                      +---------------+----------+----------+-------------+--------+ Brachial Dist  102       monophasic                      +---------------+----------+----------+-------------+--------+ Radial Mid     72        monophasic                      +---------------+----------+----------+-------------+--------+ Radial Dist    58        monophasic                      +---------------+----------+----------+-------------+--------+ Ulnar Mid      37        monophasic>50% stenosis         +---------------+----------+----------+-------------+--------+ Ulnar Dist     35        monophasic>50% stenosis         +---------------+----------+----------+-------------+--------+ Palmar Arch    28        monophasic                       +---------------+----------+----------+-------------+--------+  Summary:  Right: Post stenotic waveform in proximal subclavian may indicate a        more proximal occlusion. 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 *See table(s) above for measurements and observations. Suggest Peripheral Vascular Consult. Electronically signed by Sherald Hess MD on 08/26/2023 at 1:46:39 PM.    Final    DG Abd 2 Views  Result Date: 08/25/2023 CLINICAL DATA:  [id number] Vomiting [id number] EXAM: ABDOMEN - 2 VIEW COMPARISON:  None Available. FINDINGS: The bowel gas pattern is normal. There is no evidence of free air. No radio-opaque calculi or other significant radiographic abnormality is seen. Excreted contrast within the urinary bladder. IMPRESSION: Negative. Electronically Signed   By: Duanne Guess D.O.   On: 08/25/2023 21:45   DG Pelvis 1-2 Views  Result Date: 08/25/2023 CLINICAL DATA:  Pain EXAM: PELVIS - 1-2 VIEW COMPARISON:  None Available. FINDINGS: There is no evidence of pelvic fracture or diastasis. Hip joints are intact. Excreted contrast within the urinary bladder. IMPRESSION: Negative. Electronically Signed   By: Duanne Guess D.O.   On: 08/25/2023 21:44   ECHOCARDIOGRAM COMPLETE  Result Date: 08/25/2023    ECHOCARDIOGRAM REPORT   Patient Name:   MERLINDA MCCLEAN Date of Exam: 08/25/2023 Medical Rec #:  [id number]        Height:       67.0 in Accession #:    [id number]       Weight:       197.5 lb Date of Birth:  January 24, 1941       BSA:          2.012 m Patient Age:    82 years         BP:           181/92 mmHg Patient Gender: F                HR:           88 bpm. Exam Location:  Inpatient Procedure: 2D Echo, Cardiac Doppler, Color Doppler and Saline Contrast Bubble            Study Indications:    Stroke  History:        Patient has prior history of Echocardiogram examinations, most                 recent 06/20/2015. CHF, Abnormal ECG and  Defibrillator,                 Arrythmias:LBBB, Signs/Symptoms:Dizziness/Lightheadedness; Risk                 Factors:Hypertension, Current Smoker and Dyslipidemia.  Sonographer:    Sheralyn Boatman RDCS Referring Phys: [id number] [facility] GOEL IMPRESSIONS  1. There is apical LV cavity systolic obliteration with a dynamic "gradient" of up to 2 m/s. There is no evidence of LV outflow obstruction and there is no systolic anterior motion of the mitral valve at rest or with the Valsalva maneuver. Left ventricular ejection fraction, by estimation, is 45 to 50%. The left ventricle has mildly decreased function. The left ventricle has no regional wall motion abnormalities. There is moderate asymmetric left ventricular hypertrophy of the basal-septal and apical segments. Indeterminate diastolic filling due to E-A fusion.  2. Right ventricular systolic function is normal. The right ventricular size is normal.  3. The mitral valve is degenerative. No evidence of mitral valve regurgitation. No evidence of mitral stenosis. Moderate  mitral annular calcification.  4. The aortic valve is tricuspid. There is mild calcification of the aortic valve. There is moderate thickening of the aortic valve. Aortic valve regurgitation is trivial. Aortic valve sclerosis/calcification is present, without any evidence of aortic stenosis. Comparison(s): Prior images unable to be directly viewed, comparison made by report only. The left ventricular function has improved. FINDINGS  Left Ventricle: There is apical LV cavity systolic obliteration with a dynamic "gradient" of up to 2 m/s. There is no evidence of LV outflow obstruction and there is no systolic anterior motion of the mitral valve at rest or with the Valsalva maneuver. Left ventricular ejection fraction, by estimation, is 45 to 50%. The left ventricle has mildly decreased function. The left ventricle has no regional wall motion abnormalities. The left ventricular internal cavity size was small. There  is moderate asymmetric left ventricular hypertrophy of the basal-septal and apical segments. Abnormal (paradoxical) septal motion, consistent with left bundle branch block. Indeterminate diastolic filling due to E-A fusion. Right Ventricle: The right ventricular size is normal. No increase in right ventricular wall thickness. Right ventricular systolic function is normal. Left Atrium: Left atrial size was normal in size. Right Atrium: Right atrial size was normal in size. Pericardium: There is no evidence of pericardial effusion. Mitral Valve: The mitral valve is degenerative in appearance. Moderate mitral annular calcification. No evidence of mitral valve regurgitation. No evidence of mitral valve stenosis. Tricuspid Valve: The tricuspid valve is grossly normal. Tricuspid valve regurgitation is not demonstrated. Aortic Valve: The aortic valve is tricuspid. There is mild calcification of the aortic valve. There is moderate thickening of the aortic valve. Aortic valve regurgitation is trivial. Aortic valve sclerosis/calcification is present, without any evidence of aortic stenosis. Pulmonic Valve: The pulmonic valve was not well visualized. Pulmonic valve regurgitation is not visualized. Aorta: The aortic root is normal in size and structure. IAS/Shunts: No atrial level shunt detected by color flow Doppler. Agitated saline contrast was given intravenously to evaluate for intracardiac shunting. Additional Comments: A device lead is visualized in the right ventricle.  LEFT VENTRICLE PLAX 2D LVIDd:         2.60 cm     Diastology LVIDs:         2.00 cm     LV e' medial:    3.70 cm/s LV PW:         1.75 cm     LV E/e' medial:  30.8 LV IVS:        2.15 cm     LV e' lateral:   4.35 cm/s LVOT diam:     1.90 cm     LV E/e' lateral: 26.2 LV SV:         56 LV SV Index:   28 LVOT Area:     2.84 cm  LV Volumes (MOD) LV vol d, MOD A2C: 62.1 ml LV vol d, MOD A4C: 61.9 ml LV vol s, MOD A2C: 30.0 ml LV vol s, MOD A4C: 27.9 ml LV SV  MOD A2C:     32.1 ml LV SV MOD A4C:     61.9 ml LV SV MOD BP:      39.3 ml RIGHT VENTRICLE             IVC RV S prime:     11.40 cm/s  IVC diam: 1.30 cm TAPSE (M-mode): 1.6 cm LEFT ATRIUM           Index        RIGHT  ATRIUM          Index LA diam:      1.10 cm 0.55 cm/m   RA Area:     8.61 cm LA Vol (A2C): 17.4 ml 8.65 ml/m   RA Volume:   15.70 ml 7.80 ml/m LA Vol (A4C): 30.2 ml 15.01 ml/m  AORTIC VALVE LVOT Vmax:   116.00 cm/s LVOT Vmean:  77.700 cm/s LVOT VTI:    0.196 m  AORTA Ao Root diam: 3.30 cm MITRAL VALVE MV Area (PHT): 6.27 cm     SHUNTS MV Decel Time: 121 msec     Systemic VTI:  0.20 m MV E velocity: 114.00 cm/s  Systemic Diam: 1.90 cm Rachelle Hora Croitoru MD Electronically signed by Thurmon Fair MD Signature Date/Time: 08/25/2023/5:20:55 PM    Final    MR BRAIN WO CONTRAST  Result Date: 08/25/2023 CLINICAL DATA:  Provided history: Stroke, follow-up. EXAM: MRI HEAD WITHOUT CONTRAST TECHNIQUE: Multiplanar, multiecho pulse sequences of the brain and surrounding structures were obtained without intravenous contrast. COMPARISON:  Non-contrast head CT and CT angiogram head/neck 08/25/2023. FINDINGS: Brain: No age advanced or lobar predominant parenchymal atrophy. Mild cerebellar atrophy. 1.5 x 2.3 x 2.9 cm acute infarct within the right corona radiata/basal ganglia. Small acute infarcts within the right insula and right subinsular white matter. Chronic lacunar infarcts within bilateral cerebral hemispheric white matter and basal ganglia. Small focus of diffusion-weighted signal hyperintensity at site of a chronic infarct in the left basal ganglia, likely reflecting susceptibility artifact from chronic blood products at this site. Background advanced patchy and confluent T2 FLAIR hyperintense signal abnormality within the cerebral white matter, nonspecific but compatible with chronic small vessel ischemic disease. Small chronic infarcts within the left cerebellar hemisphere. 9 mm extra-axial mass along  the right aspect of the anterior falx, likely reflecting a meningioma (for instance as seen on series 11, image 41) (series 15, image 22). Contact upon the underlying right lobe without underlying parenchymal edema. There are a few chronic microhemorrhages scattered within the supratentorial brain. Partially empty sella turcica. No extra-axial fluid collection. No midline shift. Vascular: Maintained flow voids within the proximal large arterial vessels. 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 Skull and upper cervical spine: Indeterminate well-circumscribed 10 mm FLAIR hyperintense and T1 hypointense lesion within the left frontal calvarium (for instance as seen on series 11, image 44). Sinuses/Orbits: No mass or acute finding within the imaged orbits. Minimal mucosal thickening within the right ethmoid and left maxillary sinuses. IMPRESSION: 1. 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 2. Small acute infarcts within the right insula and right subinsular white matter. 3. Background advanced chronic small ischemic disease with multiple chronic infarcts, as described. 4. 9 mm extra-axial mass along the right aspect of the anterior falx, likely reflecting a meningioma. Contact upon the underlying right frontal lobe without underlying parenchymal edema 5. Mild cerebellar atrophy. 6. Indeterminate 10 mm osseous lesion within the left frontal calvarium. Consider a follow-up brain MRI in 3 months to ensure stability. Electronically Signed   By: Jackey Loge D.O.   On: 08/25/2023 13:59   DG Chest Port 1 View  Result Date: 08/25/2023 CLINICAL DATA:  Weakness status post fall EXAM: PORTABLE CHEST 1 VIEW COMPARISON:  Chest radiograph dated 04/03/2021 FINDINGS: Lines/tubes: Left chest wall ICD leads project over the right atrium and ventricle and tributary of the coronary sinus. Lungs: Low lung volumes with bronchovascular  crowding.  No focal consolidation. Pleura: No pneumothorax or pleural effusion. Heart/mediastinum: The heart size and mediastinal contours are within normal limits. Bones: No radiographic finding of acute displaced fracture. IMPRESSION: 1. Low lung volumes with bronchovascular crowding. No focal consolidation. 2.  No radiographic finding of acute displaced fracture. Electronically Signed   By: Agustin Cree M.D.   On: 08/25/2023 10:29   CT ANGIO HEAD NECK W WO CM W PERF (CODE STROKE)  Result Date: 08/25/2023 CLINICAL DATA:  Neuro deficit, acute, stroke suspected EXAM: CT ANGIOGRAPHY HEAD AND NECK CT PERFUSION BRAIN TECHNIQUE: Multidetector CT imaging of the head and neck was performed using the standard protocol during bolus administration of intravenous contrast. Multiplanar CT image reconstructions and MIPs were obtained to evaluate the vascular anatomy. Carotid stenosis measurements (when applicable) are obtained utilizing NASCET criteria, using the distal internal carotid diameter as the denominator. Multiphase CT imaging of the brain was performed following IV bolus contrast injection. Subsequent parametric perfusion maps were calculated using RAPID software. RADIATION DOSE REDUCTION: This exam was performed according to the departmental dose-optimization program which includes automated exposure control, adjustment of the mA and/or kV according to patient size and/or use of iterative reconstruction technique. CONTRAST:  OMNIPAQUE IOHEXOL 350 MG/ML SOLN COMPARISON:  Same day CT head. FINDINGS: CTA NECK FINDINGS Aortic arch: Moderate stenosis of the right subclavian artery origin. Intraluminal filling defect within the proximal subclavian artery which is suspicious for thrombus. Ulcerated atherosclerosis of the left subclavian artery origin with mild narrowing. Right carotid system: Atherosclerosis at the carotid bifurcation with approximately 30% stenosis of the ICA origin. Left carotid system:  Atherosclerosis at the carotid bifurcation involving the proximal ICA with proximally 50% stenosis of the proximal ICA. Vertebral arteries: Severe bilateral vertebral artery origin stenosis. Both vertebral arteries are patent. Left dominant. Skeleton: No acute abnormality on limited assessment. Multilevel degenerative change in the cervical spine. Other neck: Subcentimeter thyroid nodules which do not require imaging follow-up (ref: J Am Coll Radiol. 2015 Feb;12(2): 143-50). Upper chest: Visualized lung apices are clear. Review of the MIP images confirms the above findings CTA HEAD FINDINGS Anterior circulation: Bilateral intracranial ICAs are patent with mild to moderate narrowing bilaterally. 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 Posterior circulation: Bilateral intradural vertebral arteries, basilar artery and bilateral posterior cerebral arteries are are patent. Moderate right proximal P2 PCA stenosis. Venous sinuses: As permitted by contrast timing, patent. Review of the MIP images confirms the above findings CT Brain Perfusion Findings: ASPECTS: 10. CBF (<30%) Volume: 0mL Perfusion (Tmax>6.0s) volume: 0mL Mismatch Volume: 0mL Infarction Location:None identified. IMPRESSION: CTA head: 1. No emergent large vessel occlusion. 2. Moderate right proximal P2 PCA stenosis. 3. 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 CTA neck: 1. Approximately 50% stenosis of the proximal ICAs bilaterally in the neck. 2. Severe bilateral vertebral artery origin stenosis. 3. Moderate atherosclerotic narrowing of the right subclavian artery origin. Intraluminal filling defect within the subclavian artery in this region is suspicious for nonocclusive thrombus versus atherosclerotic plaque extending into the lumen. Right upper extremity ultrasound may be useful to further evaluate and also exclude more distal clot in the arm. 4. Aortic Atherosclerosis  (ICD10-I70.0). CT perfusion: No evidence of core infarct or penumbra. Electronically Signed   By: Feliberto Harts M.D.   On: 08/25/2023 09:39   CT HEAD CODE STROKE WO CONTRAST  Result Date: 08/25/2023 CLINICAL DATA:  Code stroke. Neuro deficit with acute stroke suspected facial  droop with left arm and leg weakness. EXAM: CT HEAD WITHOUT CONTRAST TECHNIQUE: Contiguous axial images were obtained from the base of the skull through the vertex without intravenous contrast. RADIATION DOSE REDUCTION: This exam was performed according to the departmental dose-optimization program which includes automated exposure control, adjustment of the mA and/or kV according to patient size and/or use of iterative reconstruction technique. COMPARISON:  None Available. FINDINGS: Brain: No evidence of acute infarction, hemorrhage, hydrocephalus, extra-axial collection or mass lesion/mass effect. Extensive chronic small vessel ischemia with gliosis and lacunar infarcts in the deep gray nuclei. None of these are clearly acute. Small chronic infarct in the left cerebellum. Vascular: Limited at the proximal MCA due to streak artifact. No convincing hyperdensity. Skull: No acute or aggressive finding. Lucency at the superior left frontal bone is likely a incidental hemangioma. Sinuses/Orbits: No acute finding. Other: These results were communicated to Dr. Wilford Corner at 8:44 am on 08/25/2023 by epic chat. ASPECTS [facility] Stroke Program Early CT Score) - Ganglionic level infarction (caudate, lentiform nuclei, internal capsule, insula, M1-M3 cortex): 7 - Supraganglionic infarction (M4-M6 cortex): 3 Total score (0-10 with 10 being normal): 10 IMPRESSION: 1. No acute finding. 2. Extensive chronic small vessel ischemia. Electronically Signed   By: Tiburcio Pea M.D.   On: 08/25/2023 08:46    Microbiology: No results found for this or any previous visit (from the past 240 hour(s)).  Time spent: 50 minutes  Signed: Miguel Rota,  MD 09/14/2023

## 2023-09-29 NOTE — Progress Notes (Signed)
RT at bedside to assist with heated high flow.   Family at bedside.

## 2023-09-29 NOTE — Progress Notes (Signed)
  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  Had hypotension and responded to fluids. Unfortunately hypoxia continued to worsen, I discussed with hospitalist attending yesterday regarding ICU transfer however family preferred to hold off. DNR/DNI was issued.  She was not a candidate for BiPAP.  She continued to remain very hypoxic and developed respiratory distress this morning and is being transitioned to comfort care.  I discussed with hospitalist attending and agree with comfort care based on goals of care discussion with the family.  Comer Locket Vassie Loll MD

## 2023-09-29 NOTE — Progress Notes (Signed)
Patient ID: Nicole Chan, female   DOB: 09/28/1941, 83 y.o.   MRN: [id number]    Progress Note from the Palliative Medicine Team at [facility]   Patient Name: Nicole Chan        Date: 09/17/2023 DOB: [birth year]  Age: 83 y.o. MRN#: [id number] Attending Physician: Miguel Rota, MD Primary Care Physician: Andi Devon, MD Admit Date: 08/25/2023   Reason for Consultation/Follow-up   Establishing Goals of Care   HPI/ Brief Hospital Review     Subjective  Extensive chart review has been completed prior to meeting with patient/family  including labs, vital signs, imaging, progress/consult notes, orders, medications and available advance directive documents.    This NP assessed patient at the bedside as a follow up to  yesterday's GOCs meeting.        Education offered today regarding  the importance of continued conversation with family and their  medical providers regarding overall plan of care and treatment options,  ensuring decisions are within the context of the patients values and GOCs.  Questions and concerns addressed   Discussed with primary team and nursing staff   Time:   minutes  Detailed review of medical records ( labs, imaging, vital signs), medically appropriate exam ( MS, skin, cardiac,  resp)   discussed with treatment team, counseling and education to patient, family, staff, documenting clinical information, medication management, coordination of care    Lorinda Creed NP  Palliative Medicine Team Team Phone # [phone] Pager [phone]

## 2023-09-29 NOTE — Progress Notes (Signed)
PT Cancellation Note  Patient Details Name: Nicole Chan MRN: [id number] DOB: 1941-04-28   Cancelled Treatment:    Reason Eval/Treat Not Completed: (P) Other (comment), per chart review pt transitioned to comfort care, will hold therapy. Please re consult if appropriate.   Lenora Boys. PTA Acute Rehabilitation Services Office: [phone]    Catalina Antigua 09/16/2023, 12:42 PM

## 2023-09-29 NOTE — Progress Notes (Signed)
Inpatient Rehab Admissions Coordinator:   Note transition to comfort care.  Estill Dooms, PT, DPT Admissions Coordinator [phone] 09/14/2023  1:21 PM

## 2023-09-29 NOTE — Progress Notes (Signed)
Patient did not tolerate morning bath.   She had increased tachypnea, O2 saturation 83% Non re-breather 15L.   Duoneb administered, patient repositioned up right, saturation at 90. RR improved Primary team informed.

## 2023-09-29 NOTE — Progress Notes (Signed)
Family at bedside. 

## 2023-09-29 NOTE — Plan of Care (Signed)
  Problem: Education: Goal: Knowledge of disease or condition will improve Outcome: Progressing Goal: Knowledge of secondary prevention will improve (MUST DOCUMENT ALL) Outcome: Progressing Goal: Knowledge of patient specific risk factors will improve Loraine Leriche N/A or DELETE if not current risk factor) Outcome: Progressing   Problem: Ischemic Stroke/TIA Tissue Perfusion: Goal: Complications of ischemic stroke/TIA will be minimized Outcome: Progressing   Problem: Coping: Goal: Will verbalize positive feelings about self Outcome: Progressing Goal: Will identify appropriate support needs Outcome: Progressing   Problem: Health Behavior/Discharge Planning: Goal: Ability to manage health-related needs will improve Outcome: Progressing Goal: Goals will be collaboratively established with patient/family Outcome: Progressing   Problem: Self-Care: Goal: Ability to participate in self-care as condition permits will improve Outcome: Progressing Goal: Verbalization of feelings and concerns over difficulty with self-care will improve Outcome: Progressing Goal: Ability to communicate needs accurately will improve Outcome: Progressing

## 2023-09-29 NOTE — Progress Notes (Incomplete)
Chaplain was contacted by virtual nursing who indicated that the Nicole Chan's family was scheduled to have a PMT conversation today, but her health was rapidly declining and chaplain assistance was needed for further support.   Chaplain arrived to pt room to find pt under the immediate care of her bedside RN, Rubin Payor, and surrounded by several family members. Chaplin introduced spiritual care and offered support. Nicole Chan's family was supporting one another well and inclined to continue to do so, but accepted assistance in notifying the front desk that they had additional visitors coming and ensu

## 2023-09-29 NOTE — Progress Notes (Signed)
See new orders, family has requested Morphine gtt be started when their brother is at bedside.   Primary RN offered emotional support, and presence.

## 2023-09-29 NOTE — Progress Notes (Signed)
 Nutrition Brief Note  Chart reviewed. Pt now transitioning to comfort care.  No further nutrition interventions planned at this time.  Please re-consult as needed.   Greig Castilla, RD, LDN Registered Dietitian II RD pager # available in [facility]  After hours/weekend pager # available in [facility]

## 2023-09-29 NOTE — Progress Notes (Signed)
Comfort care orders, family at bedside.

## 2023-09-29 DEATH — deceased

## 2023-10-12 ENCOUNTER — Ambulatory Visit: Payer: Medicare Other | Admitting: Vascular Surgery

## 2024-02-26 IMAGING — MG MM DIGITAL SCREENING BILAT W/ TOMO AND CAD
6 of 10 series · 6 of 30 positions shown · non-contrast
Comparison: None available.

CLINICAL DATA: Screening.

EXAM:
DIGITAL SCREENING BILATERAL MAMMOGRAM WITH TOMOSYNTHESIS AND CAD
TECHNIQUE: Bilateral screening digital craniocaudal and mediolateral oblique
mammograms were obtained. Bilateral screening digital breast
tomosynthesis was performed. The images were evaluated with
computer-aided detection.

[L MLO synth-2D (1 of 2)]
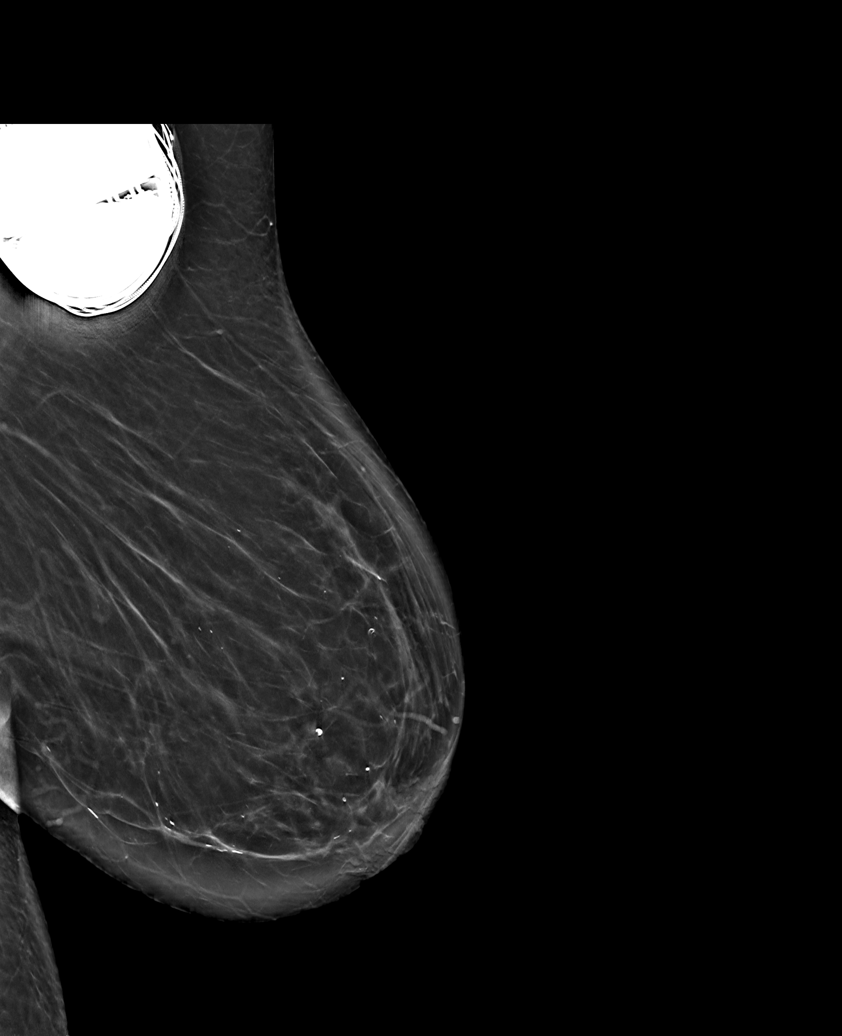

[L MLO synth-2D (2 of 2)]
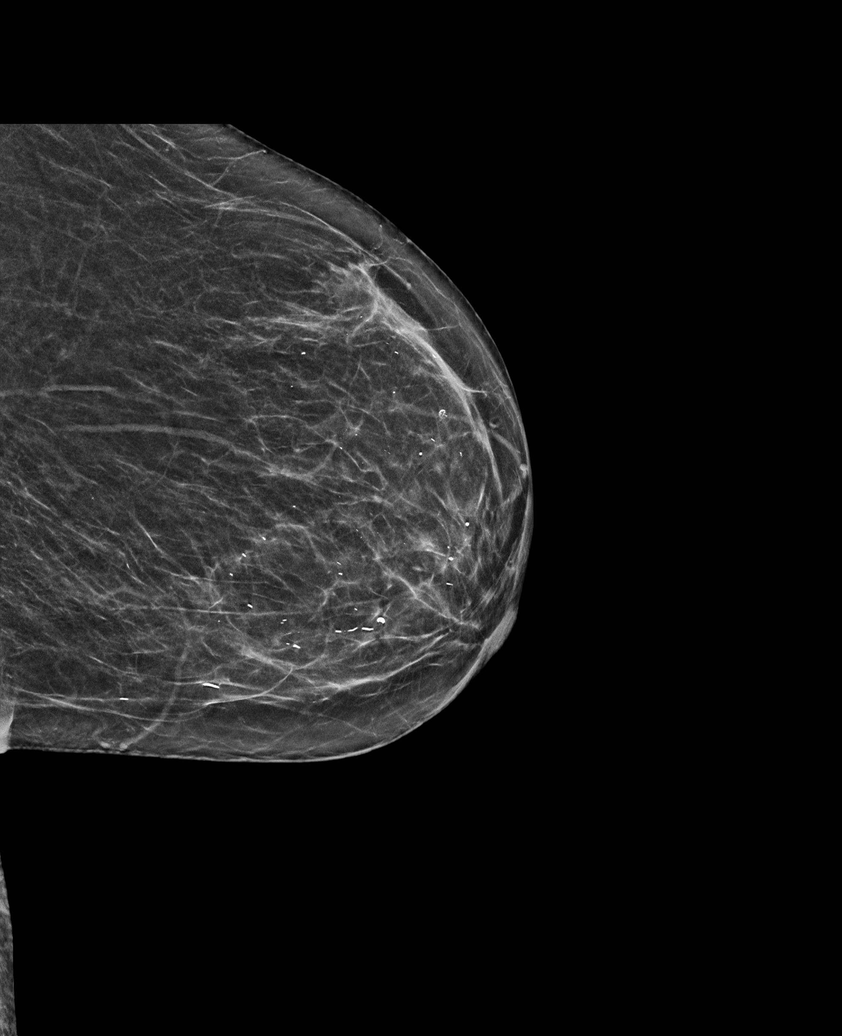

[R CC synth-2D]
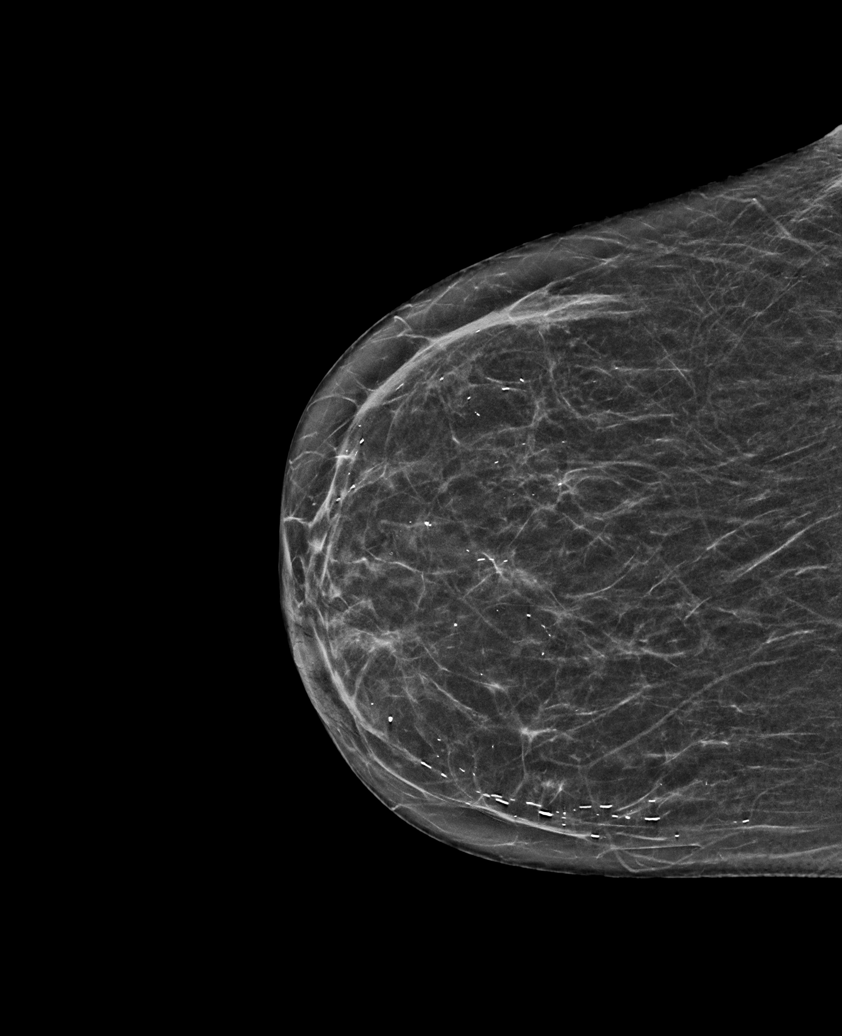

[R MLO synth-2D]
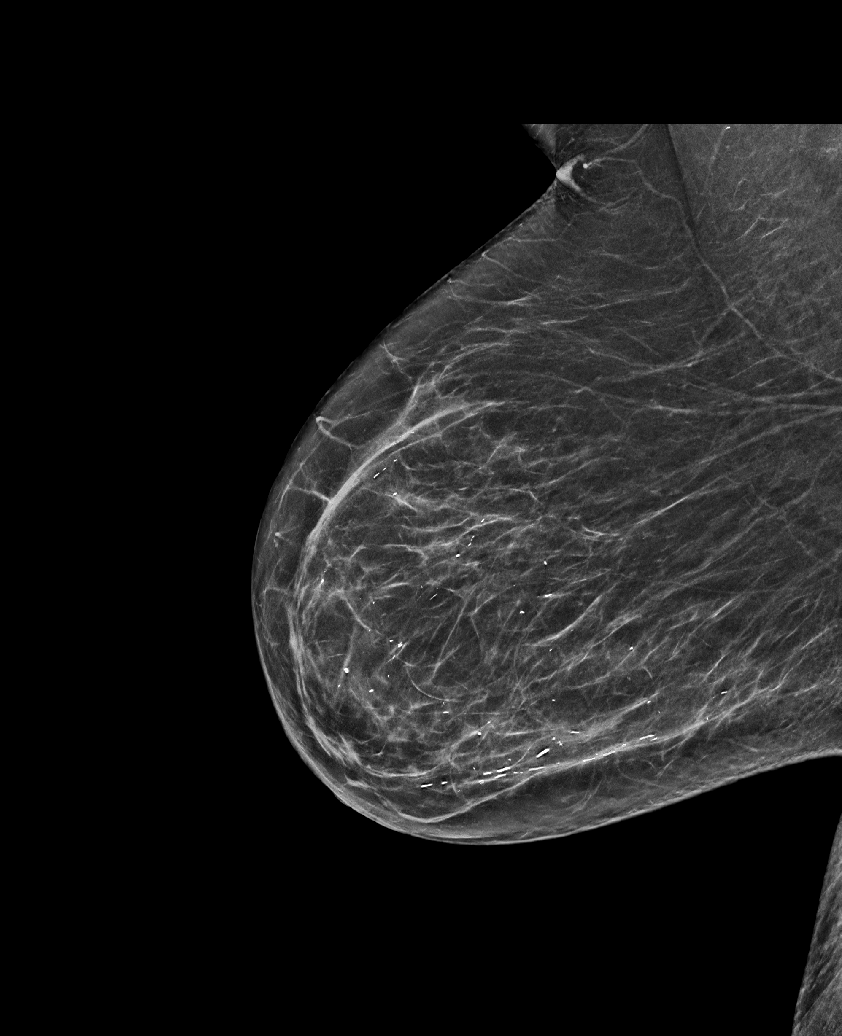

[L CC synth-2D]
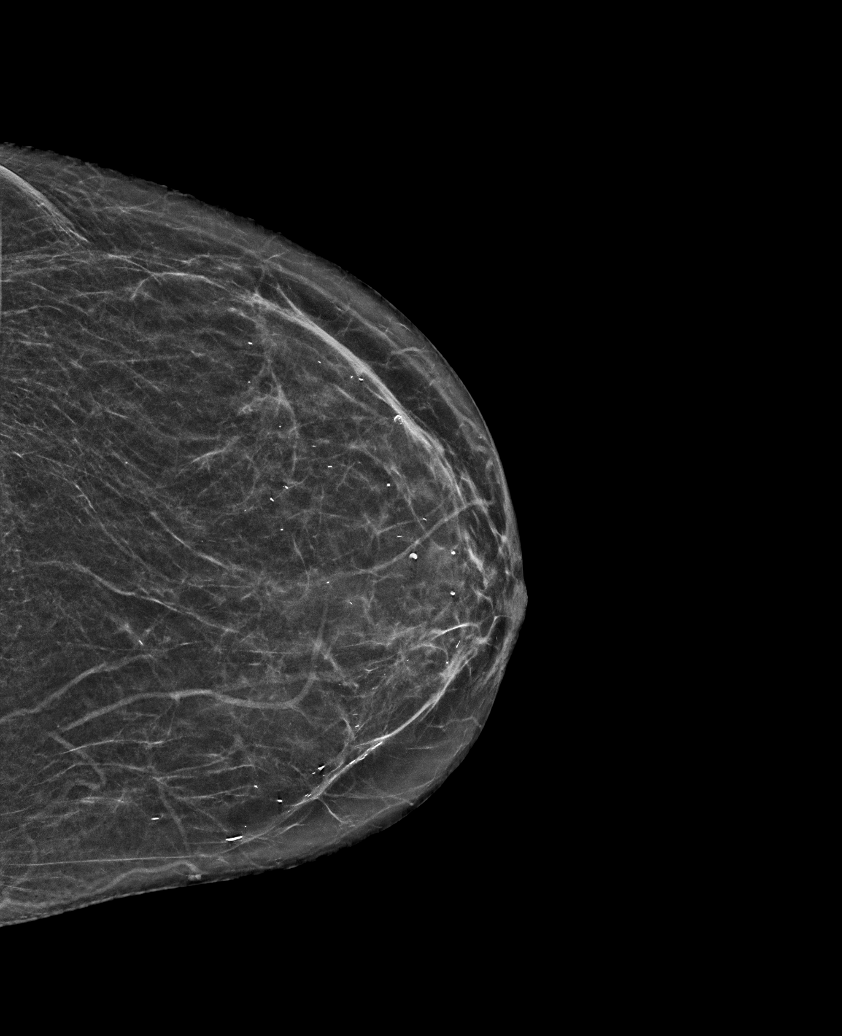

[R CC tomo · tomo slice 27/53.0]
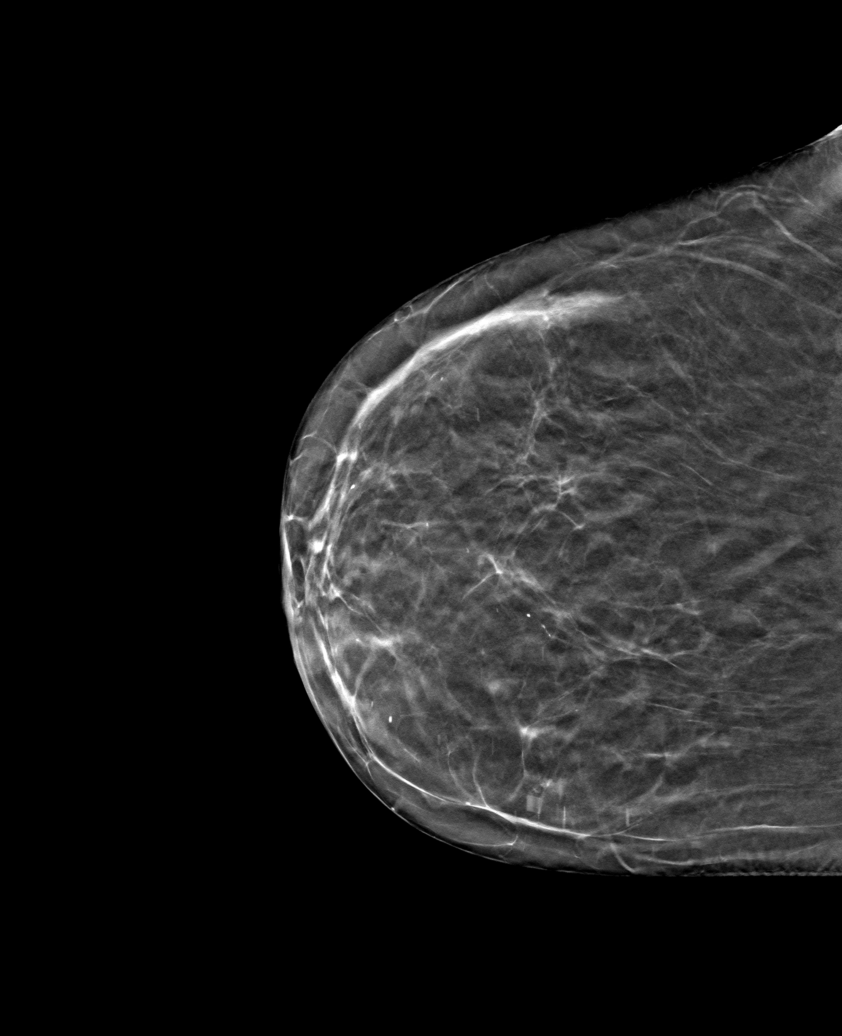

[6 of 30 positions shown; findings below may reference images not displayed]

ACR Breast Density Category b: There are scattered areas of
fibroglandular density.
FINDINGS: There are no findings suspicious for malignancy.
IMPRESSION: No mammographic evidence of malignancy. A result letter of this
screening mammogram will be mailed directly to the patient.

RECOMMENDATION:
Screening mammogram in one year. (Code:GB-[id number])

BI-RADS CATEGORY  1: Negative.
